# Patient Record
Sex: Female | Born: 1950 | Race: Black or African American | Hispanic: No | Marital: Married | Smoking: Former smoker
Health system: Southern US, Community
[De-identification: ages and names within clinical notes are randomized; demographics above are authoritative.]

## PROBLEM LIST (undated history)

## (undated) DIAGNOSIS — K219 Gastro-esophageal reflux disease without esophagitis: Secondary | ICD-10-CM

## (undated) DIAGNOSIS — D649 Anemia, unspecified: Secondary | ICD-10-CM

## (undated) DIAGNOSIS — F32A Depression, unspecified: Secondary | ICD-10-CM

## (undated) DIAGNOSIS — F410 Panic disorder [episodic paroxysmal anxiety] without agoraphobia: Secondary | ICD-10-CM

## (undated) DIAGNOSIS — I428 Other cardiomyopathies: Secondary | ICD-10-CM

## (undated) DIAGNOSIS — M1990 Unspecified osteoarthritis, unspecified site: Secondary | ICD-10-CM

## (undated) DIAGNOSIS — G894 Chronic pain syndrome: Secondary | ICD-10-CM

## (undated) DIAGNOSIS — N183 Chronic kidney disease, stage 3 unspecified: Secondary | ICD-10-CM

## (undated) DIAGNOSIS — Z789 Other specified health status: Secondary | ICD-10-CM

## (undated) DIAGNOSIS — J309 Allergic rhinitis, unspecified: Secondary | ICD-10-CM

## (undated) DIAGNOSIS — E785 Hyperlipidemia, unspecified: Secondary | ICD-10-CM

## (undated) DIAGNOSIS — I10 Essential (primary) hypertension: Secondary | ICD-10-CM

## (undated) DIAGNOSIS — I5042 Chronic combined systolic (congestive) and diastolic (congestive) heart failure: Secondary | ICD-10-CM

## (undated) DIAGNOSIS — K56609 Unspecified intestinal obstruction, unspecified as to partial versus complete obstruction: Secondary | ICD-10-CM

## (undated) DIAGNOSIS — F329 Major depressive disorder, single episode, unspecified: Secondary | ICD-10-CM

## (undated) HISTORY — DX: Depression, unspecified: F32.A

## (undated) HISTORY — DX: Anemia, unspecified: D64.9

## (undated) HISTORY — DX: Chronic pain syndrome: G89.4

## (undated) HISTORY — DX: Panic disorder (episodic paroxysmal anxiety): F41.0

## (undated) HISTORY — DX: Chronic combined systolic (congestive) and diastolic (congestive) heart failure: I50.42

## (undated) HISTORY — PX: CERVICAL DISC SURGERY: SHX588

## (undated) HISTORY — DX: Allergic rhinitis, unspecified: J30.9

## (undated) HISTORY — DX: Other cardiomyopathies: I42.8

## (undated) HISTORY — DX: Essential (primary) hypertension: I10

## (undated) HISTORY — DX: Major depressive disorder, single episode, unspecified: F32.9

## (undated) HISTORY — PX: COLON SURGERY: SHX602

## (undated) HISTORY — DX: Chronic kidney disease, stage 3 unspecified: N18.30

## (undated) HISTORY — DX: Hyperlipidemia, unspecified: E78.5

## (undated) HISTORY — DX: Gastro-esophageal reflux disease without esophagitis: K21.9

## (undated) HISTORY — PX: TUBAL LIGATION: SHX77

---

## 1997-07-14 ENCOUNTER — Ambulatory Visit (HOSPITAL_COMMUNITY): Admission: RE | Admit: 1997-07-14 | Discharge: 1997-07-14 | Payer: Self-pay | Admitting: Obstetrics & Gynecology

## 1998-01-20 HISTORY — PX: ABDOMINAL HYSTERECTOMY: SHX81

## 1998-05-16 ENCOUNTER — Emergency Department (HOSPITAL_COMMUNITY): Admission: EM | Admit: 1998-05-16 | Discharge: 1998-05-16 | Payer: Self-pay | Admitting: Emergency Medicine

## 1998-08-15 ENCOUNTER — Other Ambulatory Visit: Admission: RE | Admit: 1998-08-15 | Discharge: 1998-08-15 | Payer: Self-pay | Admitting: Family Medicine

## 2000-03-27 ENCOUNTER — Other Ambulatory Visit: Admission: RE | Admit: 2000-03-27 | Discharge: 2000-03-27 | Payer: Self-pay | Admitting: Family Medicine

## 2000-07-07 ENCOUNTER — Encounter (INDEPENDENT_AMBULATORY_CARE_PROVIDER_SITE_OTHER): Payer: Self-pay | Admitting: Specialist

## 2000-07-07 ENCOUNTER — Inpatient Hospital Stay (HOSPITAL_COMMUNITY): Admission: RE | Admit: 2000-07-07 | Discharge: 2000-07-10 | Payer: Self-pay | Admitting: *Deleted

## 2002-04-22 ENCOUNTER — Encounter: Admission: RE | Admit: 2002-04-22 | Discharge: 2002-04-22 | Payer: Self-pay | Admitting: Family Medicine

## 2002-04-22 ENCOUNTER — Encounter: Payer: Self-pay | Admitting: Family Medicine

## 2002-05-30 ENCOUNTER — Encounter: Payer: Self-pay | Admitting: Emergency Medicine

## 2002-05-30 ENCOUNTER — Emergency Department (HOSPITAL_COMMUNITY): Admission: EM | Admit: 2002-05-30 | Discharge: 2002-05-30 | Payer: Self-pay | Admitting: Emergency Medicine

## 2003-09-26 ENCOUNTER — Encounter: Admission: RE | Admit: 2003-09-26 | Discharge: 2003-09-26 | Payer: Self-pay | Admitting: Family Medicine

## 2004-02-26 ENCOUNTER — Ambulatory Visit: Payer: Self-pay | Admitting: Family Medicine

## 2004-02-29 ENCOUNTER — Encounter: Admission: RE | Admit: 2004-02-29 | Discharge: 2004-02-29 | Payer: Self-pay | Admitting: Family Medicine

## 2004-03-04 ENCOUNTER — Ambulatory Visit: Payer: Self-pay | Admitting: Family Medicine

## 2004-10-15 ENCOUNTER — Ambulatory Visit: Payer: Self-pay | Admitting: Family Medicine

## 2005-05-05 ENCOUNTER — Ambulatory Visit: Payer: Self-pay | Admitting: Family Medicine

## 2005-05-22 ENCOUNTER — Ambulatory Visit: Payer: Self-pay | Admitting: Family Medicine

## 2005-06-13 ENCOUNTER — Ambulatory Visit (HOSPITAL_COMMUNITY): Admission: RE | Admit: 2005-06-13 | Discharge: 2005-06-13 | Payer: Self-pay | Admitting: Anesthesiology

## 2005-07-16 ENCOUNTER — Ambulatory Visit: Payer: Self-pay | Admitting: Family Medicine

## 2005-07-18 ENCOUNTER — Ambulatory Visit: Payer: Self-pay | Admitting: Internal Medicine

## 2005-07-22 ENCOUNTER — Ambulatory Visit: Payer: Self-pay | Admitting: Family Medicine

## 2005-07-25 ENCOUNTER — Ambulatory Visit: Payer: Self-pay | Admitting: Family Medicine

## 2005-07-29 ENCOUNTER — Ambulatory Visit: Payer: Self-pay | Admitting: Gastroenterology

## 2005-07-31 ENCOUNTER — Ambulatory Visit: Payer: Self-pay | Admitting: Cardiology

## 2005-08-05 ENCOUNTER — Ambulatory Visit: Payer: Self-pay | Admitting: Gastroenterology

## 2005-10-14 ENCOUNTER — Ambulatory Visit: Payer: Self-pay | Admitting: Family Medicine

## 2005-10-21 ENCOUNTER — Ambulatory Visit: Payer: Self-pay | Admitting: Family Medicine

## 2005-10-22 ENCOUNTER — Ambulatory Visit: Payer: Self-pay | Admitting: Family Medicine

## 2005-10-24 ENCOUNTER — Ambulatory Visit: Payer: Self-pay | Admitting: Family Medicine

## 2005-10-27 ENCOUNTER — Ambulatory Visit: Payer: Self-pay | Admitting: Cardiovascular Disease

## 2005-10-28 ENCOUNTER — Ambulatory Visit: Payer: Self-pay | Admitting: Family Medicine

## 2005-11-04 ENCOUNTER — Ambulatory Visit: Payer: Self-pay | Admitting: Cardiology

## 2005-11-12 ENCOUNTER — Ambulatory Visit (HOSPITAL_COMMUNITY): Admission: RE | Admit: 2005-11-12 | Discharge: 2005-11-12 | Payer: Self-pay | Admitting: Cardiology

## 2005-11-12 ENCOUNTER — Ambulatory Visit: Payer: Self-pay | Admitting: Cardiology

## 2005-11-25 ENCOUNTER — Ambulatory Visit: Payer: Self-pay | Admitting: *Deleted

## 2005-11-25 ENCOUNTER — Ambulatory Visit: Payer: Self-pay | Admitting: Cardiology

## 2006-06-03 ENCOUNTER — Ambulatory Visit: Payer: Self-pay | Admitting: Family Medicine

## 2006-07-21 ENCOUNTER — Ambulatory Visit: Payer: Self-pay | Admitting: Family Medicine

## 2006-09-03 DIAGNOSIS — J309 Allergic rhinitis, unspecified: Secondary | ICD-10-CM | POA: Insufficient documentation

## 2006-09-03 DIAGNOSIS — F329 Major depressive disorder, single episode, unspecified: Secondary | ICD-10-CM

## 2006-09-03 DIAGNOSIS — I10 Essential (primary) hypertension: Secondary | ICD-10-CM | POA: Insufficient documentation

## 2006-10-06 DIAGNOSIS — K645 Perianal venous thrombosis: Secondary | ICD-10-CM

## 2006-10-13 ENCOUNTER — Ambulatory Visit: Payer: Self-pay | Admitting: Family Medicine

## 2006-10-15 ENCOUNTER — Telehealth: Payer: Self-pay | Admitting: Family Medicine

## 2007-03-18 ENCOUNTER — Telehealth: Payer: Self-pay | Admitting: Family Medicine

## 2007-05-07 ENCOUNTER — Telehealth: Payer: Self-pay | Admitting: Family Medicine

## 2007-05-24 DIAGNOSIS — J069 Acute upper respiratory infection, unspecified: Secondary | ICD-10-CM | POA: Insufficient documentation

## 2007-05-25 ENCOUNTER — Ambulatory Visit: Payer: Self-pay | Admitting: Family Medicine

## 2007-07-08 ENCOUNTER — Ambulatory Visit: Payer: Self-pay | Admitting: Family Medicine

## 2007-07-08 LAB — CONVERTED CEMR LAB
Albumin: 4 g/dL (ref 3.5–5.2)
Alkaline Phosphatase: 47 units/L (ref 39–117)
BUN: 7 mg/dL (ref 6–23)
Basophils Relative: 0.3 % (ref 0.0–1.0)
Bilirubin Urine: NEGATIVE
CO2: 36 meq/L — ABNORMAL HIGH (ref 19–32)
Calcium: 9.6 mg/dL (ref 8.4–10.5)
Cholesterol: 168 mg/dL (ref 0–200)
GFR calc Af Amer: 112 mL/min
Glucose, Bld: 105 mg/dL — ABNORMAL HIGH (ref 70–99)
Glucose, Urine, Semiquant: NEGATIVE
Ketones, urine, test strip: NEGATIVE
LDL Cholesterol: 100 mg/dL — ABNORMAL HIGH (ref 0–99)
Lymphocytes Relative: 56.8 % — ABNORMAL HIGH (ref 12.0–46.0)
Platelets: 317 10*3/uL (ref 150–400)
RBC: 3.48 M/uL — ABNORMAL LOW (ref 3.87–5.11)
Sodium: 142 meq/L (ref 135–145)
Total CHOL/HDL Ratio: 3
Total Protein: 7.5 g/dL (ref 6.0–8.3)
Triglycerides: 58 mg/dL (ref 0–149)
Urobilinogen, UA: 0.2

## 2007-07-14 ENCOUNTER — Emergency Department (HOSPITAL_BASED_OUTPATIENT_CLINIC_OR_DEPARTMENT_OTHER): Admission: EM | Admit: 2007-07-14 | Discharge: 2007-07-14 | Payer: Self-pay | Admitting: Emergency Medicine

## 2007-07-15 ENCOUNTER — Ambulatory Visit: Payer: Self-pay | Admitting: Family Medicine

## 2007-07-15 DIAGNOSIS — M542 Cervicalgia: Secondary | ICD-10-CM

## 2007-07-15 DIAGNOSIS — D6489 Other specified anemias: Secondary | ICD-10-CM

## 2007-07-15 DIAGNOSIS — D649 Anemia, unspecified: Secondary | ICD-10-CM | POA: Insufficient documentation

## 2007-07-15 DIAGNOSIS — F410 Panic disorder [episodic paroxysmal anxiety] without agoraphobia: Secondary | ICD-10-CM

## 2007-07-15 DIAGNOSIS — E039 Hypothyroidism, unspecified: Secondary | ICD-10-CM | POA: Insufficient documentation

## 2007-07-15 LAB — CONVERTED CEMR LAB
Eosinophils Relative: 2.2 % (ref 0.0–5.0)
Folate: 9 ng/mL
Lymphocytes Relative: 52.1 % — ABNORMAL HIGH (ref 12.0–46.0)
MCV: 95.7 fL (ref 78.0–100.0)
Monocytes Absolute: 0.5 10*3/uL (ref 0.1–1.0)
Monocytes Relative: 10.3 % (ref 3.0–12.0)
Neutro Abs: 1.6 10*3/uL (ref 1.4–7.7)
Platelets: 346 10*3/uL (ref 150–400)
Transferrin: 265.3 mg/dL (ref >=212.0)
Vitamin B-12: 334 pg/mL (ref 211–911)
WBC: 4.6 10*3/uL (ref 4.5–10.5)

## 2007-07-27 ENCOUNTER — Telehealth: Payer: Self-pay | Admitting: Family Medicine

## 2007-08-09 ENCOUNTER — Telehealth: Payer: Self-pay | Admitting: Internal Medicine

## 2007-09-28 ENCOUNTER — Telehealth: Payer: Self-pay | Admitting: *Deleted

## 2007-09-30 ENCOUNTER — Telehealth: Payer: Self-pay | Admitting: Family Medicine

## 2007-12-13 ENCOUNTER — Telehealth: Payer: Self-pay | Admitting: Family Medicine

## 2008-01-03 ENCOUNTER — Telehealth: Payer: Self-pay | Admitting: Family Medicine

## 2008-01-06 ENCOUNTER — Ambulatory Visit: Payer: Self-pay | Admitting: Family Medicine

## 2008-01-10 ENCOUNTER — Encounter (INDEPENDENT_AMBULATORY_CARE_PROVIDER_SITE_OTHER): Payer: Self-pay | Admitting: *Deleted

## 2008-01-29 ENCOUNTER — Ambulatory Visit: Payer: Self-pay | Admitting: Diagnostic Radiology

## 2008-01-29 ENCOUNTER — Emergency Department (HOSPITAL_BASED_OUTPATIENT_CLINIC_OR_DEPARTMENT_OTHER): Admission: EM | Admit: 2008-01-29 | Discharge: 2008-01-29 | Payer: Self-pay | Admitting: Emergency Medicine

## 2008-02-02 ENCOUNTER — Telehealth: Payer: Self-pay | Admitting: *Deleted

## 2008-02-14 ENCOUNTER — Emergency Department (HOSPITAL_BASED_OUTPATIENT_CLINIC_OR_DEPARTMENT_OTHER): Admission: EM | Admit: 2008-02-14 | Discharge: 2008-02-14 | Payer: Self-pay | Admitting: Emergency Medicine

## 2008-02-21 ENCOUNTER — Telehealth: Payer: Self-pay | Admitting: *Deleted

## 2008-02-23 ENCOUNTER — Ambulatory Visit (HOSPITAL_BASED_OUTPATIENT_CLINIC_OR_DEPARTMENT_OTHER): Admission: RE | Admit: 2008-02-23 | Discharge: 2008-02-23 | Payer: Self-pay | Admitting: Emergency Medicine

## 2008-02-23 ENCOUNTER — Ambulatory Visit: Payer: Self-pay | Admitting: Diagnostic Radiology

## 2008-04-10 ENCOUNTER — Telehealth: Payer: Self-pay | Admitting: Family Medicine

## 2008-04-13 ENCOUNTER — Telehealth: Payer: Self-pay | Admitting: Family Medicine

## 2008-04-17 ENCOUNTER — Telehealth: Payer: Self-pay | Admitting: *Deleted

## 2008-09-04 ENCOUNTER — Ambulatory Visit: Payer: Self-pay | Admitting: Internal Medicine

## 2008-09-14 ENCOUNTER — Telehealth: Payer: Self-pay | Admitting: Family Medicine

## 2008-10-25 ENCOUNTER — Telehealth: Payer: Self-pay | Admitting: Family Medicine

## 2008-11-02 ENCOUNTER — Ambulatory Visit: Payer: Self-pay | Admitting: Family Medicine

## 2008-11-02 LAB — CONVERTED CEMR LAB
AST: 20 units/L (ref 0–37)
Albumin: 4.3 g/dL (ref 3.5–5.2)
Bilirubin Urine: NEGATIVE
Bilirubin, Direct: 0 mg/dL (ref 0.0–0.3)
Calcium: 9.3 mg/dL (ref 8.4–10.5)
Cholesterol: 198 mg/dL (ref 0–200)
Creatinine, Ser: 0.6 mg/dL (ref 0.4–1.2)
Eosinophils Absolute: 0 10*3/uL (ref 0.0–0.7)
Eosinophils Relative: 1.2 % (ref 0.0–5.0)
HCT: 35.4 % — ABNORMAL LOW (ref 36.0–46.0)
HDL: 67.1 mg/dL (ref >39.00)
Hemoglobin: 12 g/dL (ref 12.0–15.0)
Ketones, ur: NEGATIVE mg/dL
LDL Cholesterol: 123 mg/dL — ABNORMAL HIGH (ref 0–99)
Leukocytes, UA: NEGATIVE
Lymphocytes Relative: 41.1 % (ref 12.0–46.0)
Lymphs Abs: 1.7 10*3/uL (ref 0.7–4.0)
MCV: 96.3 fL (ref 78.0–100.0)
Monocytes Relative: 9.8 % (ref 3.0–12.0)
Neutro Abs: 2 10*3/uL (ref 1.4–7.7)
Nitrite: NEGATIVE
Sodium: 141 meq/L (ref 135–145)
TSH: 0.56 microintl units/mL (ref 0.35–5.50)
Total CHOL/HDL Ratio: 3
Total Protein: 7.6 g/dL (ref 6.0–8.3)
Triglycerides: 41 mg/dL (ref 0.0–149.0)
Urobilinogen, UA: 0.2 (ref 0.0–1.0)
VLDL: 8.2 mg/dL (ref 0.0–40.0)
pH: 7 (ref 5.0–8.0)

## 2008-11-09 ENCOUNTER — Ambulatory Visit: Payer: Self-pay | Admitting: Family Medicine

## 2008-11-09 DIAGNOSIS — E876 Hypokalemia: Secondary | ICD-10-CM | POA: Insufficient documentation

## 2008-11-10 ENCOUNTER — Telehealth: Payer: Self-pay | Admitting: Family Medicine

## 2008-12-05 ENCOUNTER — Ambulatory Visit (HOSPITAL_COMMUNITY): Admission: RE | Admit: 2008-12-05 | Discharge: 2008-12-06 | Payer: Self-pay | Admitting: Neurological Surgery

## 2009-01-16 ENCOUNTER — Telehealth: Payer: Self-pay | Admitting: Family Medicine

## 2009-01-31 ENCOUNTER — Telehealth: Payer: Self-pay | Admitting: Family Medicine

## 2009-04-11 ENCOUNTER — Ambulatory Visit: Payer: Self-pay | Admitting: Family Medicine

## 2009-04-11 DIAGNOSIS — H60399 Other infective otitis externa, unspecified ear: Secondary | ICD-10-CM | POA: Insufficient documentation

## 2009-05-02 ENCOUNTER — Ambulatory Visit: Payer: Self-pay | Admitting: Family Medicine

## 2009-05-02 DIAGNOSIS — J029 Acute pharyngitis, unspecified: Secondary | ICD-10-CM

## 2009-05-10 ENCOUNTER — Telehealth: Payer: Self-pay | Admitting: Family Medicine

## 2009-05-21 ENCOUNTER — Telehealth: Payer: Self-pay | Admitting: Family Medicine

## 2009-05-24 ENCOUNTER — Ambulatory Visit: Payer: Self-pay | Admitting: Family Medicine

## 2009-05-24 ENCOUNTER — Telehealth: Payer: Self-pay | Admitting: *Deleted

## 2009-10-13 ENCOUNTER — Encounter: Admission: RE | Admit: 2009-10-13 | Discharge: 2009-10-13 | Payer: Self-pay | Admitting: Neurological Surgery

## 2009-10-29 ENCOUNTER — Telehealth: Payer: Self-pay | Admitting: Family Medicine

## 2009-11-09 ENCOUNTER — Ambulatory Visit: Payer: Self-pay | Admitting: Family Medicine

## 2009-11-09 LAB — CONVERTED CEMR LAB
ALT: 15 units/L (ref 0–35)
Albumin: 4.3 g/dL (ref 3.5–5.2)
BUN: 11 mg/dL (ref 6–23)
Basophils Relative: 0.9 % (ref 0.0–3.0)
Chloride: 102 meq/L (ref 96–112)
Creatinine, Ser: 0.7 mg/dL (ref 0.4–1.2)
Direct LDL: 130 mg/dL
Eosinophils Absolute: 0.1 10*3/uL (ref 0.0–0.7)
GFR calc non Af Amer: 106.99 mL/min (ref >60)
Glucose, Bld: 83 mg/dL (ref 70–99)
Glucose, Urine, Semiquant: NEGATIVE
Lymphs Abs: 1.8 10*3/uL (ref 0.7–4.0)
MCV: 92.5 fL (ref 78.0–100.0)
Monocytes Absolute: 0.6 10*3/uL (ref 0.1–1.0)
Monocytes Relative: 12.5 % — ABNORMAL HIGH (ref 3.0–12.0)
Neutrophils Relative %: 44.4 % (ref 43.0–77.0)
Nitrite: NEGATIVE
Platelets: 294 10*3/uL (ref 150.0–400.0)
Potassium: 3.4 meq/L — ABNORMAL LOW (ref 3.5–5.1)
RBC: 3.61 M/uL — ABNORMAL LOW (ref 3.87–5.11)
RDW: 15.1 % — ABNORMAL HIGH (ref 11.5–14.6)
Total CHOL/HDL Ratio: 3
Urobilinogen, UA: 0.2
VLDL: 13.6 mg/dL (ref 0.0–40.0)
WBC: 4.5 10*3/uL (ref 4.5–10.5)
pH: 7

## 2009-12-11 ENCOUNTER — Encounter: Payer: Self-pay | Admitting: Family Medicine

## 2009-12-11 ENCOUNTER — Ambulatory Visit: Payer: Self-pay | Admitting: Family Medicine

## 2010-01-25 ENCOUNTER — Ambulatory Visit
Admission: RE | Admit: 2010-01-25 | Discharge: 2010-01-25 | Payer: Self-pay | Source: Home / Self Care | Attending: Family Medicine | Admitting: Family Medicine

## 2010-02-12 ENCOUNTER — Ambulatory Visit
Admission: RE | Admit: 2010-02-12 | Discharge: 2010-02-12 | Payer: Self-pay | Source: Home / Self Care | Attending: Family Medicine | Admitting: Family Medicine

## 2010-02-17 LAB — CONVERTED CEMR LAB
Calcium: 9.5 mg/dL (ref 8.4–10.5)
Glucose, Bld: 102 mg/dL — ABNORMAL HIGH (ref 70–99)
Potassium: 3.4 meq/L — ABNORMAL LOW (ref 3.5–5.1)
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
## 2010-02-19 NOTE — Progress Notes (Signed)
Summary: med refill  Phone Note Refill Request Message from:  [facility]  Refills Requested: Medication #1:  ZOLPIDEM TARTRATE 10 MG TABS one at bedtime as needed for sleep Initial call taken by: Heron Sabins,  October 29, 2009 3:07 PM  Follow-up for Phone Call        Ambien dispense to 10 mg, number 30, 1 nightly, refills x 3 Follow-up by: Roderick Pee MD,  October 29, 2009 5:24 PM  Additional Follow-up for Phone Call Additional follow up Details #1::        Rx called to pharmacy Additional Follow-up by: DeShannon Smith CMA (AAMA),  October 30, 2009 11:00 AM    Prescriptions: ZOLPIDEM TARTRATE 10 MG TABS (ZOLPIDEM TARTRATE) one at bedtime as needed for sleep  #30 x 3   Entered by:   Mervin Hack CMA (AAMA)   Authorized by:   Roderick Pee MD   Signed by:   Mervin Hack CMA (AAMA) on 10/30/2009   Method used:   Telephoned to ...       [facility] [address] (retail)       [address]       [place], [place]  [address]       Ph: [phone] or [phone]       Fax: [phone]   RxID:   [id number]

## 2010-02-19 NOTE — Progress Notes (Signed)
Summary: [name]  Phone Note Refill Request Message from:  Fax from Pharmacy on May 10, 2009 1:53 PM  Refills Requested: Medication #1:  ZOLPIDEM TARTRATE 10 MG TABS one at bedtime as needed for sleep Initial call taken by: Kern Reap CMA Duncan Dull),  May 10, 2009 1:53 PM    Prescriptions: ZOLPIDEM TARTRATE 10 MG TABS (ZOLPIDEM TARTRATE) one at bedtime as needed for sleep  #100 x 3   Entered by:   Kern Reap CMA (AAMA)   Authorized by:   Roderick Pee MD   Signed by:   Kern Reap CMA (AAMA) on 05/10/2009   Method used:   Historical   RxID:   [id number]
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
## 2010-02-19 NOTE — Progress Notes (Signed)
Summary: hydrocodone refill  Phone Note From Pharmacy   Summary of Call: patient is requesting a refill of hydrocodone is this okay to fill? Initial call taken by: Kern Reap CMA Duncan Dull),  May 21, 2009 11:42 AM  Follow-up for Phone Call        surgery done by Dr. Danielle Dess refer her to him for pain treatment Follow-up by: Roderick Pee MD,  May 21, 2009 11:55 AM  Additional Follow-up for Phone Call Additional follow up Details #1::        faxed to pharmacy Additional Follow-up by: Kern Reap CMA Duncan Dull),  May 21, 2009 3:50 PM

## 2010-02-19 NOTE — Progress Notes (Signed)
Summary: citalopram refill  Phone Note From Pharmacy   [facility] Call For: [name]  Summary of Call: request to fill citalopram 20mg  Take 1 tablet by mouth once a day .  Last 01/16/09 #30 with no refills Initial call taken by: Gladis Riffle, RN,  January 31, 2009 12:09 PM  Follow-up for Phone Call        Celexa 20 mg, dispense 100 tablets directions one nightly, refills x 3 Follow-up by: Roderick Pee MD,  January 31, 2009 12:33 PM  Additional Follow-up for Phone Call Additional follow up Details #1::        see Rx Additional Follow-up by: Gladis Riffle, RN,  January 31, 2009 2:38 PM    New/Updated Medications: CELEXA 20 MG  TABS (CITALOPRAM HYDROBROMIDE) one once daily CELEXA 20 MG  TABS (CITALOPRAM HYDROBROMIDE) one once daily at bedtime Prescriptions: CELEXA 20 MG  TABS (CITALOPRAM HYDROBROMIDE) one once daily at bedtime  #100 x 3   Entered by:   Gladis Riffle, RN   Authorized by:   Roderick Pee MD   Signed by:   Gladis Riffle, RN on 01/31/2009   Method used:   Electronically to        [facility] [phone]* (retail)       [address]       [place], [place]  [address]       Ph: [phone] or [phone]       Fax: [phone]   RxID:   [id number]

## 2010-02-19 NOTE — Assessment & Plan Note (Signed)
Summary: sore throat//ccm   Vital Signs:  Patient profile:   60 year old female Menstrual status:  hysterectomy Weight:      127 pounds Temp:     98.0 degrees F oral BP sitting:   120 / 62  (left arm) Cuff size:   regular  Vitals Entered By: Kern Reap CMA Duncan Dull) (May 02, 2009 11:19 AM) CC: sore throat, drainage Is Patient Diabetic? No Pain Assessment Patient in pain? no        CC:  sore throat and drainage.  History of Present Illness: lou is a 60 year old female, nonsmoker, who comes in today for evaluation of a severe sore throat.  She said sore throat with no fever.  She's had a cough, but no sputum production.  She also complains of a mild headache, but no earache.  Review of systems otherwise negative.  She is recovering from neck surgery.  She had a ruptured disk, which was surgically repaired by Dr. Danielle Dess  Allergies: No Known Drug Allergies  Social History: Reviewed history from 09/03/2006 and no changes required. Occupation: Married Never Smoked Alcohol use-no Regular exercise-no  Review of Systems      See HPI  Physical Exam  General:  Well-developed,well-nourished,in no acute distress; alert,appropriate and cooperative throughout examination Head:  Normocephalic and atraumatic without obvious abnormalities. No apparent alopecia or balding. Eyes:  No corneal or conjunctival inflammation noted. EOMI. Perrla. Funduscopic exam benign, without hemorrhages, exudates or papilledema. Vision grossly normal. Ears:  External ear exam shows no significant lesions or deformities.  Otoscopic examination reveals clear canals, tympanic membranes are intact bilaterally without bulging, retraction, inflammation or discharge. Hearing is grossly normal bilaterally. Nose:  External nasal examination shows no deformity or inflammation. Nasal mucosa are pink and moist without lesions or exudates. Mouth:  Oral mucosa and oropharynx without lesions or exudates.  Teeth in  good repair. Neck:  No deformities, masses, or tenderness noted. Lungs:  Normal respiratory effort, chest expands symmetrically. Lungs are clear to auscultation, no crackles or wheezes.   Impression & Recommendations:  Problem # 1:  SORE THROAT (ICD-462) Assessment New  Orders: Rapid Strep (69629)  Complete Medication List: 1)  Norvasc 10 Mg Tabs (Amlodipine besylate) .... Once daily 2)  Levothroid 100 Mcg Tabs (Levothyroxine sodium) .Marland Kitchen.. 1 once daily 3)  Celexa 20 Mg Tabs (Citalopram hydrobromide) .... One once daily at bedtime 4)  Lorazepam 0.5 Mg Tabs (Lorazepam) .... One tablet every 8 hours as needed and 5)  Zolpidem Tartrate 10 Mg Tabs (Zolpidem tartrate) .... One at bedtime as needed for sleep 6)  Hydrochlorothiazide 25 Mg Tabs (Hydrochlorothiazide) .... Take one tab once daily 7)  Hydromet 5-1.5 Mg/72ml Syrp (Hydrocodone-homatropine) .... 1/2 to 1 tsp three times a day as needed  Patient Instructions: 1)  Get plenty of rest, drink lots of clear liquids, and use Tylenol or Ibuprofen for fever and comfort. Return in 7-10 days if you're not better:sooner if you're feeling worse. 2)  Take 650-1000mg  of Tylenol every 4-6 hours as needed for relief of pain or comfort of fever AVOID taking more than 4000mg   in a 24 hour period (can cause liver damage in higher doses). 3)  Hydromet one half to 1 teaspoon up to 3 times a day for sore throat and cough Prescriptions: HYDROMET 5-1.5 MG/5ML SYRP (HYDROCODONE-HOMATROPINE) 1/2 to 1 tsp three times a day as needed  #8oz x 0   Entered and Authorized by:   Roderick Pee MD   Signed by:  Roderick Pee MD on 05/02/2009   Method used:   Print then Give to Patient   RxID:   [id number]   Laboratory Results  Date/Time Received: May 02, 2009   Other Tests  Rapid Strep: negative Comments: Kern Reap CMA Duncan Dull)  May 02, 2009 11:43 AM

## 2010-02-19 NOTE — Progress Notes (Signed)
Summary: Pt req refill of Hydromet Cough Syrup [facility] [address]  Phone Note Call from Patient   Caller: Patient Summary of Call: Pt is req refill of Hydromet cough syrup. Pt still has a bad cough and is req more syrup. Please [facility] to [facility] [address]   Initial call taken by: Lucy Antigua,  May 24, 2009 8:57 AM  Follow-up for Phone Call        problem*.................OVtoday  Follow-up by: Roderick Pee MD,  May 24, 2009 9:40 AM  Additional Follow-up for Phone Call Additional follow up Details #1::        Phone Call Completed Additional Follow-up by: Kern Reap CMA Duncan Dull),  May 24, 2009 10:38 AM
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
## 2010-03-04 ENCOUNTER — Telehealth: Payer: Self-pay | Admitting: Family Medicine

## 2010-03-04 NOTE — Telephone Encounter (Signed)
patient  Is coming in for an office visit

## 2010-03-04 NOTE — Telephone Encounter (Signed)
If the medication.  We gave her is not helping then we will need to see her on Tuesday for an office visit

## 2010-03-04 NOTE — Telephone Encounter (Signed)
Was seen last week with depression and anxiety, due to loss of son. She was rx'd something a medication. Not helping at all. [facility]ing [facility] Please advise pt.

## 2010-03-05 ENCOUNTER — Encounter: Payer: Self-pay | Admitting: Family Medicine

## 2010-03-06 ENCOUNTER — Ambulatory Visit (INDEPENDENT_AMBULATORY_CARE_PROVIDER_SITE_OTHER): Payer: BC Managed Care – PPO | Admitting: Family Medicine

## 2010-03-06 ENCOUNTER — Ambulatory Visit: Payer: Self-pay | Admitting: Family Medicine

## 2010-03-06 ENCOUNTER — Encounter: Payer: Self-pay | Admitting: Family Medicine

## 2010-03-06 VITALS — BP 120/80 | Temp 98.0°F | Ht 60.5 in | Wt 148.0 lb

## 2010-03-06 DIAGNOSIS — F329 Major depressive disorder, single episode, unspecified: Secondary | ICD-10-CM

## 2010-03-06 MED ORDER — CITALOPRAM HYDROBROMIDE 40 MG PO TABS: ORAL_TABLET | ORAL | Status: DC

## 2010-03-06 MED ORDER — LORAZEPAM 0.5 MG PO TABS: ORAL_TABLET | ORAL | Status: DC

## 2010-03-06 NOTE — Patient Instructions (Signed)
Take one Celexa tablet at bedtime.  Take one Ativan tablet 3 times a day as needed.  Call Victorino Dike to help set up outpatient counseling.  Return p.r.n.
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
## 2010-03-07 ENCOUNTER — Other Ambulatory Visit: Payer: Self-pay | Admitting: Family Medicine

## 2010-03-11 ENCOUNTER — Ambulatory Visit: Payer: BC Managed Care – PPO | Admitting: Professional

## 2010-03-21 ENCOUNTER — Ambulatory Visit (INDEPENDENT_AMBULATORY_CARE_PROVIDER_SITE_OTHER): Payer: 59 | Admitting: Professional

## 2010-03-21 DIAGNOSIS — F4323 Adjustment disorder with mixed anxiety and depressed mood: Secondary | ICD-10-CM

## 2010-04-09 ENCOUNTER — Encounter: Payer: Self-pay | Admitting: Family Medicine

## 2010-04-09 ENCOUNTER — Ambulatory Visit (INDEPENDENT_AMBULATORY_CARE_PROVIDER_SITE_OTHER): Payer: 59 | Admitting: Family Medicine

## 2010-04-09 VITALS — BP 180/92 | HR 120 | Temp 98.7°F | Resp 12 | Ht 61.0 in | Wt 146.0 lb

## 2010-04-09 DIAGNOSIS — G4701 Insomnia due to medical condition: Secondary | ICD-10-CM

## 2010-04-09 DIAGNOSIS — R0602 Shortness of breath: Secondary | ICD-10-CM

## 2010-04-09 DIAGNOSIS — I498 Other specified cardiac arrhythmias: Secondary | ICD-10-CM

## 2010-04-09 DIAGNOSIS — I471 Supraventricular tachycardia: Secondary | ICD-10-CM

## 2010-04-09 MED ORDER — ZOLPIDEM TARTRATE 10 MG PO TABS: 10.0000 mg | ORAL_TABLET | Freq: Every evening | ORAL | Status: DC | PRN

## 2010-04-09 MED ORDER — NADOLOL 20 MG PO TABS: 20.0000 mg | ORAL_TABLET | Freq: Every day | ORAL | Status: DC

## 2010-04-09 NOTE — Patient Instructions (Signed)
Decrease the aim of the pain to a half of a 10-mg tablet in the morning.  Take 20 mg of Corgard, now, then 20 mg at bedtime tonight, then, starting tomorrow morning, take 120 mg tablet twice daily.  Return in one week for follow

## 2010-04-09 NOTE — Progress Notes (Signed)
  Subjective:    Patient ID: [name], female    DOB: 1950-05-29, 60 y.o.   MRN: [id number]  HPI [name] Is a 60 -year-old female, who comes in today because she woke up in the night with a sensation of rapid heart rate and numbness in her left arm from her elbow to her hand.  Her son was recently killed on [facility].  She still dealing with the stress and depression, secondary to that.  In addition, she has a history of underlying panic disorder.  She has no cardiac symptoms.     Review of Systems    Cardiac musculoskeletal review of systems all negative Objective:   Physical Exam    Well-developed well-nourished, female in no acute distress.  HEENT negative.  Lungs are clear to auscultation.  Cardiac exam normal except for a rate of 100.  No murmur, rubs, or gallops.  EKG within normal limits except for sinus tachycardia.  Examination left arm was normal    Assessment & Plan:  Tachycardia secondary to underlying stress and depression because of the death of her son.  Begin beta-blocker 20 mg b.i.d. Follow-up in one week

## 2010-04-22 ENCOUNTER — Encounter: Payer: Self-pay | Admitting: Family Medicine

## 2010-04-22 ENCOUNTER — Ambulatory Visit (INDEPENDENT_AMBULATORY_CARE_PROVIDER_SITE_OTHER): Payer: 59 | Admitting: Family Medicine

## 2010-04-22 VITALS — BP 110/80 | Temp 98.6°F | Wt 145.0 lb

## 2010-04-22 DIAGNOSIS — F3289 Other specified depressive episodes: Secondary | ICD-10-CM

## 2010-04-22 DIAGNOSIS — F329 Major depressive disorder, single episode, unspecified: Secondary | ICD-10-CM

## 2010-04-22 NOTE — Patient Instructions (Signed)
Follow-up in 4 weeks.  Try to cut the Ambien in half and take 5 mg nightly
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
## 2010-04-24 LAB — BASIC METABOLIC PANEL
CO2: 30 mEq/L (ref 19–32)
Calcium: 9.7 mg/dL (ref 8.4–10.5)
Glucose, Bld: 99 mg/dL (ref 70–99)
Potassium: 3.4 mEq/L — ABNORMAL LOW (ref 3.5–5.1)
Sodium: 140 mEq/L (ref 135–145)

## 2010-04-24 LAB — CBC
HCT: 35.3 % — ABNORMAL LOW (ref 36.0–46.0)
Hemoglobin: 11.9 g/dL — ABNORMAL LOW (ref 12.0–15.0)
MCHC: 33.6 g/dL (ref 30.0–36.0)
RDW: 13.3 % (ref 11.5–15.5)

## 2010-05-06 ENCOUNTER — Telehealth: Payer: Self-pay | Admitting: *Deleted

## 2010-05-06 LAB — BASIC METABOLIC PANEL
BUN: 10 mg/dL (ref 6–23)
Calcium: 10.3 mg/dL (ref 8.4–10.5)
GFR calc non Af Amer: >60 mL/min (ref >60)
Glucose, Bld: 101 mg/dL — ABNORMAL HIGH (ref 70–99)

## 2010-05-06 LAB — DIFFERENTIAL
Basophils Absolute: 0 10*3/uL (ref 0.0–0.1)
Lymphocytes Relative: 50 % — ABNORMAL HIGH (ref 12–46)
Neutro Abs: 1.9 10*3/uL (ref 1.7–7.7)

## 2010-05-06 LAB — CBC
Platelets: 306 10*3/uL (ref 150–400)
RDW: 14.2 % (ref 11.5–15.5)

## 2010-05-06 NOTE — Telephone Encounter (Signed)
Appt scheduled

## 2010-05-07 ENCOUNTER — Ambulatory Visit: Payer: 59 | Admitting: Family Medicine

## 2010-05-09 ENCOUNTER — Ambulatory Visit: Payer: 59 | Admitting: Family Medicine

## 2010-05-22 ENCOUNTER — Encounter: Payer: Self-pay | Admitting: Family Medicine

## 2010-05-22 ENCOUNTER — Ambulatory Visit (INDEPENDENT_AMBULATORY_CARE_PROVIDER_SITE_OTHER): Payer: 59 | Admitting: Family Medicine

## 2010-05-22 DIAGNOSIS — R5383 Other fatigue: Secondary | ICD-10-CM

## 2010-05-22 DIAGNOSIS — M542 Cervicalgia: Secondary | ICD-10-CM

## 2010-05-22 DIAGNOSIS — R5381 Other malaise: Secondary | ICD-10-CM

## 2010-05-22 DIAGNOSIS — K645 Perianal venous thrombosis: Secondary | ICD-10-CM

## 2010-05-22 DIAGNOSIS — D6489 Other specified anemias: Secondary | ICD-10-CM

## 2010-05-22 DIAGNOSIS — F329 Major depressive disorder, single episode, unspecified: Secondary | ICD-10-CM

## 2010-05-22 LAB — T3, FREE: T3, Free: 1.7 pg/mL — ABNORMAL LOW (ref 2.3–4.2)

## 2010-05-22 LAB — POCT URINALYSIS DIPSTICK
Leukocytes, UA: NEGATIVE
Protein, UA: NEGATIVE
Spec Grav, UA: 1.005
Urobilinogen, UA: 0.2
pH, UA: 6.5

## 2010-05-22 LAB — CBC WITH DIFFERENTIAL/PLATELET
Basophils Relative: 0.4 % (ref 0.0–3.0)
Eosinophils Relative: 1.7 % (ref 0.0–5.0)
HCT: 31.8 % — ABNORMAL LOW (ref 36.0–46.0)
Hemoglobin: 10.4 g/dL — ABNORMAL LOW (ref 12.0–15.0)
Lymphs Abs: 1.8 10*3/uL (ref 0.7–4.0)
MCV: 85.1 fl (ref 78.0–100.0)
Monocytes Absolute: 0.6 10*3/uL (ref 0.1–1.0)
RBC: 3.74 Mil/uL — ABNORMAL LOW (ref 3.87–5.11)
WBC: 4.3 10*3/uL — ABNORMAL LOW (ref 4.5–10.5)

## 2010-05-22 LAB — HEPATIC FUNCTION PANEL
ALT: 27 U/L (ref 0–35)
Total Protein: 7.7 g/dL (ref 6.0–8.3)

## 2010-05-22 LAB — BASIC METABOLIC PANEL
Chloride: 98 mEq/L (ref 96–112)
Potassium: 3.1 mEq/L — ABNORMAL LOW (ref 3.5–5.1)
Sodium: 138 mEq/L (ref 135–145)

## 2010-05-22 LAB — VITAMIN B12: Vitamin B-12: 265 pg/mL (ref 211–911)

## 2010-05-22 NOTE — Progress Notes (Signed)
  Subjective:    Patient ID: [name], female    DOB: 1950/10/27, 60 y.o.   MRN: [id number]  HPI  [name] Is a 60 year old, married female, who comes in today for evaluation of multiple issues.  As previously noted.  She is going to grief counseling because her son, [name], was killed when [employer], walking it late at night in a rainstorm.  For blood pressure.  She takes Norvasc 5 mg, hydrochlorothiazide, 25 mg, Corgard 20 mg.  BP 120/80, pulse 70 and regular.  No side effects from medication.  She continues to have neck pain.  She had cervical disk surgery by Dr. Danielle Dess.  She takes Motrin 800 mg 4 times daily.  Cautioned that I recommend max doses 800 twice daily, and no more.  She also feels fatigued.  She denies any abdominal pain, GI bleeding, et Karie Soda.  She did have a colonoscopy 5 years ago, which showed some colon polyps.  Previous labs in October, showed a low grade anemia with a hemoglobin of 11.    Review of Systems    General psychiatric review of systems otherwise negative Objective:   Physical Exam    Well-developed well-nourished, female, in no acute distress.  Examination neck normal except for swelling about both clavicles.    Assessment & Plan:  Depression continue the Ambien, 5 mg nightly, Ativan p.r.n., and grief counseling.  Hypertension.  Continue Norvasc 5 mg daily, Rhinocort, thiazide 25 daily, and Corgard 20 daily.  Hypothyroidism.  Continue Synthroid 100 daily.  Decreased energy level.  Plan decrease Motrin to 800 b.i.d., neurosurgical reconsult because of neck pain, check labs.

## 2010-05-22 NOTE — Patient Instructions (Signed)
Continue your current medications.  Do not take more than 800 mg of Motrin twice daily.  Call and reconsult with the neurosurgery group about two neck.  I will call you next week when I get your lab work back

## 2010-05-29 NOTE — Progress Notes (Signed)
Spoke with patient.

## 2010-06-04 ENCOUNTER — Encounter: Payer: Self-pay | Admitting: Internal Medicine

## 2010-06-04 ENCOUNTER — Ambulatory Visit (INDEPENDENT_AMBULATORY_CARE_PROVIDER_SITE_OTHER): Payer: 59 | Admitting: Internal Medicine

## 2010-06-04 DIAGNOSIS — S9000XA Contusion of unspecified ankle, initial encounter: Secondary | ICD-10-CM

## 2010-06-04 DIAGNOSIS — S9002XA Contusion of left ankle, initial encounter: Secondary | ICD-10-CM

## 2010-06-04 DIAGNOSIS — I10 Essential (primary) hypertension: Secondary | ICD-10-CM

## 2010-06-04 MED ORDER — HYDROCODONE-ACETAMINOPHEN 7.5-750 MG PO TABS: 1.0000 | ORAL_TABLET | Freq: Four times a day (QID) | ORAL | Status: DC | PRN
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
## 2010-06-04 NOTE — Patient Instructions (Signed)
Attempt to keep off your left leg as much as possible Celebrex 2 capsules daily  Vicodin as needed  Consider using the Ace bandage if pain persists

## 2010-06-07 NOTE — Assessment & Plan Note (Signed)
[facility]                              CARDIOLOGY OFFICE NOTE   [name], [name]                     MRN:          [id number]  DATE:11/25/2005                            DOB:          1950/10/05    REFERRING PHYSICIAN:  Cecil Cranker, MD, [facility]   PATIENT OF:  Dr. Randa Evens and Dr. Kelle Darting.   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  The  patient called yesterday saying she has had some back pain off and on since  her angiogram and was brought in for evaluation.  She had mild oozing from  her right groin.  Today, she has a severe headache that she woke up with in  the middle of the night, but she does have a history of headaches.  She said  she has had some bilateral back pain, abdominal pain, constipation and a  fullness in her abdomen ever since the angiogram.  She is not sure whether  it has anything to do with it or if it is a separate problem.  She denies  any dizziness or presyncope, chest pain, shortness of breath.   CURRENT MEDICATIONS:  1. Norvasc 10 mg daily.  2. Aspirin 81 mg daily.  3. Hydrochlorothiazide 25 mg daily.  4. K-Dur 20 mEq daily.  5. Synthroid 50 mcg daily.  6. Atenolol 25 mg daily.   PHYSICAL EXAMINATION:  This is a pleasant 60 year old African-American  female in no acute distress.  Blood pressure 153/92, pulse 93, weight 150.  NECK:  Without JVD, HR, bruit or thyroid enlargement.  LUNGS:  Clear anterior and posterolateral.  HEART:  Regular rate and rhythm at 90 BPM, normal S1, S2, positive S4 with  2/6 systolic murmur at the left sternal border.  ABDOMEN:  Soft, without organomegaly, masses, lesions, or abnormal  tenderness.  LOWER BACK:  She says she  can feel some tenderness when I palpate her lower  back but there is no severe pain or bruising.  EXTREMITIES:  Right groin does have a right femoral bruit.  There is no  significant widened pulse.  She has good distal pulses.   IMPRESSION:  1. Back pain, ?Etiology.  Need to consider retroperitoneal bleed status      post angiogram.  2. Normal renal arteries on angiogram.  3. Probable essential hypertension, difficult to control.  4. Abdominal fullness and constipation.  5. History of diverticulosis.   PLAN:  At this time we will check a stat CBC.  If there is any indication  that she is anemic, she will need an abdominal CT to rule out  retroperitoneal bleed.  I suspect that this not related to her angiogram.  She will follow up with Dr. Samule Ohm in one month or sooner if needed.      Jacolyn Reedy, PA-C  Electronically Signed      Cecil Cranker, MD, [facility]  Electronically Signed   ML/MedQ  DD: 11/25/2005  DT: 11/25/2005  Job #: [id number]   cc:   Salvadore Farber, MD  Eugenio Hoes Tawanna Cooler, MD

## 2010-06-07 NOTE — Op Note (Signed)
[name], [name] NO.:  [id number]   MEDICAL RECORD NO.:  [id number]          PATIENT TYPE:  AMB   LOCATION:  SDS                          FACILITY:  [facility]   PHYSICIAN:  Salvadore Farber, MD  DATE OF BIRTH:  Apr 08, 1950   DATE OF PROCEDURE:  11/12/2005  DATE OF DISCHARGE:  11/12/2005                                 OPERATIVE REPORT   PROCEDURE:  Abdominal aortography, selective bilateral renal angiography.   INDICATIONS:  [name] is a 60 year old lady with difficult to control  hypertension.  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  She presents for  angiography to assess the renal arteries and exclude fibromuscular  dysplasia.   PROCEDURE TECHNIQUE:  Informed consent was obtained.  Under 1% lidocaine  local anesthesia, a 5-French sheath was placed in the right common femoral  artery using modified Seldinger technique.  Pigtail catheter was advanced to  the suprarenal abdominal aorta.  Abdominal aortography was performed by  power injection.  A LIMA catheter was then used to selectively engage each  renal artery in turn.  Renal arteries are angiographically normal.  Sheath  was removed and manual compression applied.  She was transferred to holding  room in stable condition having tolerated the procedure well.   COMPLICATIONS:  None.   FINDINGS:  1. Abdominal aorta:  Normal infrarenal abdominal aorta without plaque      formation or aneurysm.  2. Single renal arteries bilaterally.  Both are normal.   IMPRESSION/PLAN:  Angiographically normal renal arteries.  Probable  essential hypertension.      Salvadore Farber, MD  Electronically Signed     WED/MEDQ  D:  11/12/2005  T:  11/13/2005  Job:  [id number]   cc:   Tinnie Gens A. Tawanna Cooler, MD

## 2010-06-07 NOTE — Op Note (Signed)
[facility]  Patient:    [name], [name]                     MRN: [id number] Proc. Date: 07/07/00 Adm. Date:  [id number] Attending:  Genia Del                           Operative Report  DATE OF BIRTH:                03-24-1950.  PREOPERATIVE DIAGNOSIS:       Menorrhagia on voluminous uterine myoma with secondary anemia refractory to medical treatment.  POSTOPERATIVE DIAGNOSIS:      Menorrhagia on voluminous uterine myoma with secondary anemia refractory to medical treatment.  OPERATION:                    Total abdominal hysterectomy with bilateral salpingo-oophorectomy.  SURGEON:                      Genia Del, M.D.  ASSISTANT:                    Silverio Lay, M.D.  ANESTHESIOLOGIST:             Dr. Pamalee Leyden.  ANESTHESIA:                   General.  ESTIMATED BLOOD LOSS:         100 cc.  DESCRIPTION OF PROCEDURE:     Under general anesthesia with endotracheal intubation, the patient is in decubitus dorsal position. A vaginal exam is done under general anesthesia revealing a mobile, increased volume uterus corresponding to about 12 weeks, no adnexal mass. The cervix is normal to palpation. The patient is then prepped with Betadine on the abdominal, suprapubic, vulvar, and vaginal areas. A bladder catheter is inserted and the patient is draped as usual. A Pfannenstiel incision is done at the site of the previous scar with the scalpel. We then opened the aponeurosis transversely with the Mayo scissors. The aponeurosis is detached from the rectus muscles on the midline superiorly and inferiorly. We then open the parietal peritoneum longitudinally with the Metzenbaum scissors and enter the abdominopelvic cavity. Abdominally, no pathology is felt or seen. In the pelvis, we note an enlarged uterus with a left posterior intramural myoma about 6 to 7 cm. The two ovaries are normal in appearance and volume. The tubes are post  bilateral tubal ligation. The bladder is adherent anteriorly on the lower uterine segment. We clamped the uterine cornua with Kelly clamps on each side of the uterus. We then use Metzenbaum scissors to release the bladder anteriorly. We then insert the Balfour retractor and the bowels are retracted upward with laps. We suture the left and then the right round ligaments. We use the cautery to cut and cauterize the round ligaments. With the Metzenbaum scissors we then open the anterior peritoneum and retract the bladder downward. We also open the peritoneum posteriorly in order to visualize the ureter on each side. The ureter is in normal anatomic location. We then open a window at the level of the broad ligaments on each side. We clamp the infundibulopelvic ligament with two curved Mastersons. We cut in between with Mayo scissors. We then suture with 0 Vicryl and a free tie on the left and on the right side. We then skeletonize the uterine arteries on both sides.  We clamp with curved Mastersons and double suture the uterine arteries with 0 Vicryl. We then go down the uterus with straight Mastersons, clamping, cutting with the scalpel and suturing with a Heaney stitch with Vicryl 0 on each side. We then reach the uterosacral ligaments which are taken separately with curved Heaneys. We suture with 0 Vicryl 0 on each side and keep that suture. We then reach the angle of the vagina. We used curved Mastersons to clamp then cut with the Mayo scissors and suture with a Heaney stitch with 0 Vicryl 0 and keep those angles. We finish cutting all around the cervix at the upper vaginal border with the Satinsky scissors. The uterus is given in one piece with the tubes and ovaries. The cervix is intact and the pathology piece is sent to pathology. We then proceed with suspension with each angle of the vagina being attached to the uterosacral ligament of the respective site with 0 Vicryl. We then close the  vaginal vault with X stitches with 0 Vicryl. We then irrigate the pelvic cavity with warm water. We suction and hemostasis is completed with the electrocautery where necessary. All the pedicles are verified and hemostasis is adequate. We then remove the Balfour retractor and the three laps. We verify hemostasis on the rectus muscles and aponeurosis. Electrocautery is used when necessary. We then close the aponeurosis by two half running sutures with 0 Vicryl. We then verify hemostasis in the adipose tissue completed with the electrocautery. Count of instruments and laps are complete x 2. We then infiltrate the subcutaneous with Marcaine 0.25%, 15 cc and the skin is reapproximated with staples. A dry dressing is then applied. Estimated blood loss was 100 cc. No complication occurred and the patient was transferred to recovery room in good status. DD:  07/07/00 TD:  07/07/00 Job: [id number]

## 2010-06-07 NOTE — Discharge Summary (Signed)
[facility]  Patient:    [name], [name]                     MRN: [id number] Adm. Date:  [id number] Disc. Date: [id number] Attending:  Genia Del                           Discharge Summary  DATE OF BIRTH:                March 21, 1950.  ADMISSION DIAGNOSES:          1. Menorrhagia with secondary anemia.                               2. 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  DISCHARGE DIAGNOSES:          1. Menorrhagia with secondary anemia.                               2. 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  HOSPITAL COURSE:              The patient was admitted on July 07, 2000. A TAH/BSO was done under general anesthesia on the same date. Her ovaries were normal x 2 and tubes were status post bilateral tubal ligation. Estimated blood loss was 100 cc. No complication occurred.  The postoperative evolution was unremarkable. The patient was discharged home on postoperative day #3. Postoperative hemoglobin was stable at 6.3 with a hematocrit of 18.5 on July 10, 2000. The patient was discharged in good status.  DISCHARGE MEDICATIONS:        She was advised Slow Fe t.i.d., and Tylox and Ibuprofen were prescribed p.r.n. and she was continued on Climara 0.1 patch which had been started on postoperative day #1.  DISCHARGE FOLLOWUP:           The patient will follow up at [facility] in four to six weeks. Postoperative advisories were given. DD:  08/23/00 TD:  08/24/00 Job: [id number]
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
## 2010-06-07 NOTE — Assessment & Plan Note (Signed)
[facility]                           GASTROENTEROLOGY OFFICE NOTE   [name], [name]                     MRN:          [id number]  DATE:07/29/2005                            DOB:          July 12, 1950    REFERRING PHYSICIAN:  JEFFREY ALLEN TODD   REFERRING PHYSICIAN:  Dr. Kelle Darting   REASON FOR REFERRAL:  Abdominal pain and rectal bleeding.   HISTORY OF PRESENT ILLNESS:  [name] is a very nice 60 year old African-  American female referred at the courtesy of Dr. Kelle Darting.  She relates  long-term problems with constipation and states she has been using an herbal  tea laxative about every other day for several years.  About two weeks ago  she had the sudden onset of sharp ongoing lower abdominal pain and a feeling  of gas and fullness.  She has generally had problems with constipation but  for the past two weeks her stools have been somewhat loose with several  stools coming with dark red blood.  She was seen by Dr. Clent Ridges, Dr. Tawanna Cooler, and  Dr. Fabian Sharp all within the past two weeks.  Her first visit was with Dr. Clent Ridges  and apparently a digital rectal examination showed blood, but no lesions and  she was started on two antibiotics for possible diverticulitis.  She  subsequently returned to see Dr. Tawanna Cooler and Dr. Fabian Sharp and is now referred for  further evaluation.  Her symptoms have improved slightly but she still has  ongoing problems with lower abdominal pain.  She started taking Milk of  Magnesia for her constipation and has discontinued the herbal laxative.  She  notes no change in stool caliber, fevers, chills, nausea, or vomiting.  No  family history of colon cancer, colon polyps, or inflammatory bowel disease.  She has not previously had a colonoscopy, sigmoidoscopy, or barium enema.   PAST MEDICAL HISTORY:  1.  Hypertension.  2.  Status post hysterectomy.   CURRENT MEDICATIONS:  Listed on the chart.  Updated and reviewed.   MEDICATION ALLERGIES:  None known.   SOCIAL HISTORY:  As per the hand written form.   REVIEW OF SYSTEMS:  As per the hand written form.   PHYSICAL EXAMINATION:  GENERAL:  No acute distress.  VITAL SIGNS:  Height 5 feet, weight 138.6 pounds, blood pressure is 124/76,  pulse 80 and regular.  HEENT:  Anicteric sclerae.  Oropharynx clear.  CHEST:  Clear to auscultation bilaterally.  CARDIAC:  Regular rate and rhythm without murmurs appreciated.  ABDOMEN:  Soft with mild distention and mild lower abdominal tenderness to  deep palpation.  No rebound or guarding.  No palpable organomegaly, masses,  or hernias.  Normoactive bowel sounds.  RECTAL:  Deferred to time of colonoscopy.  Recent examination by Dr. Clent Ridges  showed blood and no lesions.  EXTREMITIES:  Without clubbing, cyanosis, edema.  NEUROLOGIC:  Alert and oriented x3.  Grossly nonfocal.   ASSESSMENT AND PLAN:  New onset lower abdominal pain associated with rectal  bleeding, rule out diverticulitis, colorectal neoplasms, colitis, and other  disorders.  She is to complete her  antibiotics as prescribed.  She will call  back our office with the names of the antibiotics.  Will obtain a CBC, CMET,  and TSH today.  Schedule a CT scan of the abdomen and pelvis with IV and  oral contrast.  Pending those findings we will likely need to proceed with  colonoscopy.  Risks, benefits, and alternatives to colonoscopy with possible  biopsy and possible polypectomy discussed with the patient and she consents  to proceed.  This will be scheduled electively as indicated based on the  abdominal and pelvic CT.                                   [name]. Pleas Koch., MD, Clementeen Graham, FACP   MTS/MedQ  DD:  07/29/2005  DT:  07/29/2005  Job #:  [id number]
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
## 2010-07-15 ENCOUNTER — Encounter: Payer: Self-pay | Admitting: Family Medicine

## 2010-07-15 ENCOUNTER — Ambulatory Visit (INDEPENDENT_AMBULATORY_CARE_PROVIDER_SITE_OTHER): Payer: BC Managed Care – PPO | Admitting: Family Medicine

## 2010-07-15 DIAGNOSIS — F329 Major depressive disorder, single episode, unspecified: Secondary | ICD-10-CM

## 2010-07-15 DIAGNOSIS — F3289 Other specified depressive episodes: Secondary | ICD-10-CM

## 2010-07-15 DIAGNOSIS — W57XXXA Bitten or stung by nonvenomous insect and other nonvenomous arthropods, initial encounter: Secondary | ICD-10-CM

## 2010-07-15 DIAGNOSIS — T148 Other injury of unspecified body region: Secondary | ICD-10-CM

## 2010-07-15 MED ORDER — LORAZEPAM 0.5 MG PO TABS: ORAL_TABLET | ORAL | Status: DC

## 2010-07-15 NOTE — Patient Instructions (Signed)
In the future if you get a tic on you .Marland Kitchen...drowmed it  with oil........... wait 15 minutes and then pull it off.  Return p.r.n.

## 2010-07-15 NOTE — Progress Notes (Signed)
  Subjective:    Patient ID: [name], female    DOB: 01/01/1951, 60 y.o.   MRN: [id number]  HPI [name] Is a 60 year old, married female, nonsmoker, who is coming in today for evaluation of a tick bite on the right side of her back x 3 weeks.  Three weeks ago she noticed a tick on the right side of her back.  Her son pulled it off, and take better.  It still is leaving a knot.  No symptoms of rocking non-spotted fever   Review of Systems General an infectious review of systems otherwise negative    Objective:   Physical Exam Well-developed well-nourished, female, in no acute distress.  Examination of back shows a 3-mm nodule, consistent with A. Inflammatory reaction to a tick bite.  No evidence of secondary infection       Assessment & Plan:  Tick bite on the back reassured

## 2010-09-30 ENCOUNTER — Other Ambulatory Visit: Payer: Self-pay | Admitting: *Deleted

## 2010-09-30 DIAGNOSIS — G4701 Insomnia due to medical condition: Secondary | ICD-10-CM

## 2010-09-30 MED ORDER — ZOLPIDEM TARTRATE 10 MG PO TABS: 10.0000 mg | ORAL_TABLET | Freq: Every evening | ORAL | Status: DC | PRN

## 2010-10-01 ENCOUNTER — Other Ambulatory Visit: Payer: Self-pay | Admitting: Family Medicine

## 2010-10-01 NOTE — Telephone Encounter (Signed)
Pt needs refill on ambien 10mg  call into [facility] [address] [phone]

## 2010-10-02 NOTE — Telephone Encounter (Signed)
Recalled in 

## 2010-10-17 LAB — DIFFERENTIAL
Basophils Absolute: 0
Basophils Relative: 1
Lymphocytes Relative: 55 — ABNORMAL HIGH
Monocytes Absolute: 0.6
Neutro Abs: 2
Neutrophils Relative %: 33 — ABNORMAL LOW

## 2010-10-17 LAB — BASIC METABOLIC PANEL
BUN: 10
CO2: 26
Calcium: 10.1
Creatinine, Ser: 0.6
GFR calc non Af Amer: >60
Glucose, Bld: 117 — ABNORMAL HIGH
Sodium: 143

## 2010-10-17 LAB — POCT B-TYPE NATRIURETIC PEPTIDE (BNP): B Natriuretic Peptide, POC: 5.2

## 2010-10-17 LAB — POCT CARDIAC MARKERS
CKMB, poc: 1.4
CKMB, poc: <1 — ABNORMAL LOW
Myoglobin, poc: 21.8
Operator id: 5513
Troponin i, poc: <0.05

## 2010-10-17 LAB — CBC
Hemoglobin: 12.1
MCHC: 33.8
Platelets: 346
RDW: 12.6

## 2010-10-17 LAB — D-DIMER, QUANTITATIVE: D-Dimer, Quant: 0.25

## 2010-11-22 ENCOUNTER — Other Ambulatory Visit: Payer: Self-pay | Admitting: *Deleted

## 2010-11-22 DIAGNOSIS — F329 Major depressive disorder, single episode, unspecified: Secondary | ICD-10-CM

## 2010-11-22 MED ORDER — LORAZEPAM 0.5 MG PO TABS: ORAL_TABLET | ORAL | Status: DC

## 2010-12-26 ENCOUNTER — Encounter: Payer: Self-pay | Admitting: Family Medicine

## 2010-12-26 ENCOUNTER — Ambulatory Visit (INDEPENDENT_AMBULATORY_CARE_PROVIDER_SITE_OTHER): Payer: BC Managed Care – PPO | Admitting: Family Medicine

## 2010-12-26 DIAGNOSIS — L255 Unspecified contact dermatitis due to plants, except food: Secondary | ICD-10-CM

## 2010-12-26 DIAGNOSIS — L247 Irritant contact dermatitis due to plants, except food: Secondary | ICD-10-CM

## 2010-12-26 DIAGNOSIS — L231 Allergic contact dermatitis due to adhesives: Secondary | ICD-10-CM | POA: Insufficient documentation

## 2010-12-26 MED ORDER — TRIAMCINOLONE ACETONIDE 0.025 % EX OINT: TOPICAL_OINTMENT | Freq: Two times a day (BID) | CUTANEOUS | Status: DC

## 2010-12-26 NOTE — Patient Instructions (Signed)
Apply small amounts of the triamcinolone ointment, and a moisturizer.....udder cream......... 3 times daily until rash goes away

## 2010-12-26 NOTE — Progress Notes (Signed)
  Subjective:    Patient ID: [name], female    DOB: 11-18-1950, 60 y.o.   MRN: [id number]  HPI L. Is a 60 year old, married female, nonsmoker, who comes in today for evaluation of a skin rash.  She states on Wednesday of this week.  She noticed a red, swollen.  The rash starting on her neck.  Previous history of allergic rhinitis.   Review of Systems General and dermatologic review of systems otherwise negative    Objective:   Physical Exam Well developed, well nourished, female no acute distress.  Examination.  Skin shows a rash around the neck, consistent with a contact dermatitis       Assessment & Plan:  Contact dermatitis......... Steroid cream, and moisturizer t.i.d. Until clear

## 2011-02-21 ENCOUNTER — Other Ambulatory Visit: Payer: Self-pay | Admitting: Family Medicine

## 2011-02-21 DIAGNOSIS — G4701 Insomnia due to medical condition: Secondary | ICD-10-CM

## 2011-02-21 MED ORDER — ZOLPIDEM TARTRATE 10 MG PO TABS: 10.0000 mg | ORAL_TABLET | Freq: Every evening | ORAL | Status: DC | PRN

## 2011-02-21 NOTE — Telephone Encounter (Signed)
Pt need generic ambien 10 mg call into [facility] [address]

## 2011-03-07 ENCOUNTER — Other Ambulatory Visit: Payer: Self-pay | Admitting: Family Medicine

## 2011-03-17 ENCOUNTER — Other Ambulatory Visit: Payer: Self-pay | Admitting: Family Medicine

## 2011-03-20 ENCOUNTER — Other Ambulatory Visit: Payer: Self-pay | Admitting: *Deleted

## 2011-03-20 DIAGNOSIS — F329 Major depressive disorder, single episode, unspecified: Secondary | ICD-10-CM

## 2011-03-20 MED ORDER — LORAZEPAM 0.5 MG PO TABS: ORAL_TABLET | ORAL | Status: DC

## 2011-05-12 ENCOUNTER — Other Ambulatory Visit: Payer: Self-pay | Admitting: Family Medicine

## 2011-06-10 ENCOUNTER — Telehealth: Payer: Self-pay | Admitting: *Deleted

## 2011-06-10 NOTE — Telephone Encounter (Signed)
Pt is having a h/a, dry cough, and a "twinge" in her chest.  An immediate shock of pain that comes and goes very quickly over her left breast x2-3 days.  Pt denies SOB and fever but she is complaining of dizziness.  Pt is wanting to be worked in with Dr Tawanna Cooler tomorrow.  Spoke with Dr Tawanna Cooler, no need to see pt. He wants Fleet Contras to call pt.

## 2011-06-10 NOTE — Telephone Encounter (Signed)
Patient states she has a "twinge" that comes randomly.  She does have a dry cough.

## 2011-06-11 NOTE — Telephone Encounter (Signed)
Since she is otherwise well I would recommend Motrin 600 mg twice daily with food for the chest wall pain reassured however office visit Thursday if she insists

## 2011-06-11 NOTE — Telephone Encounter (Signed)
Left detailed message on machine for patient to call back if no improvement

## 2011-06-24 ENCOUNTER — Other Ambulatory Visit: Payer: Self-pay | Admitting: *Deleted

## 2011-06-24 DIAGNOSIS — G4701 Insomnia due to medical condition: Secondary | ICD-10-CM

## 2011-06-24 MED ORDER — ZOLPIDEM TARTRATE 10 MG PO TABS: 10.0000 mg | ORAL_TABLET | Freq: Every evening | ORAL | Status: DC | PRN

## 2011-07-21 ENCOUNTER — Other Ambulatory Visit: Payer: Self-pay | Admitting: *Deleted

## 2011-07-21 DIAGNOSIS — F329 Major depressive disorder, single episode, unspecified: Secondary | ICD-10-CM

## 2011-07-21 MED ORDER — LORAZEPAM 0.5 MG PO TABS: ORAL_TABLET | ORAL | Status: DC

## 2011-08-05 ENCOUNTER — Telehealth: Payer: Self-pay | Admitting: Family Medicine

## 2011-08-05 NOTE — Telephone Encounter (Signed)
Caller: Rain/Patient; PCP: Roderick Pee.; CB#: [phone]; Removed a tick from her right thigh on Saturday, 7/12.  States that she removed all of the tick.  The area is painful.  No fever.  No rash, but has redness around the bite.   The pain is constant, rates 5/10 on pain scale. Triaged per Bites, needs to be seen in 4 hours.  No appt.available.  Note to office staff.

## 2011-08-05 NOTE — Telephone Encounter (Signed)
Julia Contras, do you want to work her in, or?

## 2011-08-05 NOTE — Telephone Encounter (Signed)
Left detailed message on machine for patient to call back if any fever etc within the next 2 weeks

## 2011-08-06 ENCOUNTER — Telehealth: Payer: Self-pay | Admitting: *Deleted

## 2011-08-06 ENCOUNTER — Ambulatory Visit: Payer: Self-pay | Admitting: Family Medicine

## 2011-08-06 NOTE — Telephone Encounter (Signed)
Patient is calling because the tick bite is on her left inner thigh, painful to touch, size of a quarter, red.  She is also having "twinges" on the left side of her head.   I informed the patient to watch the site and let us know if there are any changes or if she has a fever.  She can also take Tylenol/Advil for any pain.

## 2011-08-28 ENCOUNTER — Other Ambulatory Visit (INDEPENDENT_AMBULATORY_CARE_PROVIDER_SITE_OTHER): Payer: BC Managed Care – PPO

## 2011-08-28 DIAGNOSIS — Z0000 Encounter for general adult medical examination without abnormal findings: Secondary | ICD-10-CM

## 2011-08-28 LAB — CBC WITH DIFFERENTIAL/PLATELET
Basophils Absolute: 0 10*3/uL (ref 0.0–0.1)
Eosinophils Relative: 2.4 % (ref 0.0–5.0)
HCT: 37.8 % (ref 36.0–46.0)
Lymphs Abs: 2.2 10*3/uL (ref 0.7–4.0)
Monocytes Relative: 10.5 % (ref 3.0–12.0)
Neutrophils Relative %: 45.2 % (ref 43.0–77.0)
Platelets: 292 10*3/uL (ref 150.0–400.0)
RDW: 14.8 % — ABNORMAL HIGH (ref 11.5–14.6)
WBC: 5.2 10*3/uL (ref 4.5–10.5)

## 2011-08-28 LAB — BASIC METABOLIC PANEL
BUN: 9 mg/dL (ref 6–23)
Calcium: 9.1 mg/dL (ref 8.4–10.5)
Creatinine, Ser: 0.6 mg/dL (ref 0.4–1.2)
GFR: 145.1 mL/min (ref >60.00)
Glucose, Bld: 81 mg/dL (ref 70–99)
Potassium: 3.2 mEq/L — ABNORMAL LOW (ref 3.5–5.1)

## 2011-08-28 LAB — HEPATIC FUNCTION PANEL
AST: 18 U/L (ref 0–37)
Bilirubin, Direct: 0 mg/dL (ref 0.0–0.3)
Total Bilirubin: 0.5 mg/dL (ref 0.3–1.2)

## 2011-08-28 LAB — POCT URINALYSIS DIPSTICK
Nitrite, UA: NEGATIVE
Protein, UA: NEGATIVE
Spec Grav, UA: 1.01
Urobilinogen, UA: 0.2

## 2011-08-28 LAB — LIPID PANEL
Cholesterol: 160 mg/dL (ref 0–200)
LDL Cholesterol: 82 mg/dL (ref 0–99)
Total CHOL/HDL Ratio: 3
Triglycerides: 77 mg/dL (ref 0.0–149.0)
VLDL: 15.4 mg/dL (ref 0.0–40.0)

## 2011-09-04 ENCOUNTER — Ambulatory Visit (INDEPENDENT_AMBULATORY_CARE_PROVIDER_SITE_OTHER): Payer: BC Managed Care – PPO | Admitting: Family Medicine

## 2011-09-04 ENCOUNTER — Encounter: Payer: Self-pay | Admitting: Family Medicine

## 2011-09-04 VITALS — BP 180/100 | Temp 98.2°F | Ht 60.5 in | Wt 127.0 lb

## 2011-09-04 DIAGNOSIS — M542 Cervicalgia: Secondary | ICD-10-CM

## 2011-09-04 DIAGNOSIS — J309 Allergic rhinitis, unspecified: Secondary | ICD-10-CM

## 2011-09-04 DIAGNOSIS — G4700 Insomnia, unspecified: Secondary | ICD-10-CM

## 2011-09-04 DIAGNOSIS — F329 Major depressive disorder, single episode, unspecified: Secondary | ICD-10-CM

## 2011-09-04 DIAGNOSIS — I10 Essential (primary) hypertension: Secondary | ICD-10-CM

## 2011-09-04 MED ORDER — HYDROCHLOROTHIAZIDE 25 MG PO TABS: 25.0000 mg | ORAL_TABLET | Freq: Every day | ORAL | Status: DC

## 2011-09-04 MED ORDER — FLUTICASONE PROPIONATE 50 MCG/ACT NA SUSP: 1.0000 | Freq: Two times a day (BID) | NASAL | Status: DC

## 2011-09-04 MED ORDER — AMLODIPINE BESYLATE 10 MG PO TABS
10.0000 mg | ORAL_TABLET | Freq: Every day | ORAL | Status: DC
Start: 2011-09-04 — End: 2012-01-09

## 2011-09-04 MED ORDER — ZOLPIDEM TARTRATE 5 MG PO TABS: ORAL_TABLET | ORAL | Status: DC

## 2011-09-04 MED ORDER — NADOLOL 20 MG PO TABS
20.0000 mg | ORAL_TABLET | Freq: Every day | ORAL | Status: DC
Start: 2011-09-04 — End: 2012-01-09

## 2011-09-04 MED ORDER — LORAZEPAM 0.5 MG PO TABS: ORAL_TABLET | ORAL | Status: DC
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
## 2011-09-04 NOTE — Patient Instructions (Signed)
Continue your current medications except decrease the Ambien to one half of a 5 mg tablet at bedtime for sleep  6 call and make an appointment to see [name]  Return in one year sooner if any problems  Remember to do a good  breast exam monthly  Call today to get set up for a screening mammogram

## 2011-09-09 ENCOUNTER — Encounter: Payer: Self-pay | Admitting: Family Medicine

## 2011-10-01 ENCOUNTER — Other Ambulatory Visit: Payer: Self-pay | Admitting: *Deleted

## 2011-10-01 DIAGNOSIS — G4700 Insomnia, unspecified: Secondary | ICD-10-CM

## 2011-10-01 MED ORDER — ZOLPIDEM TARTRATE 5 MG PO TABS: ORAL_TABLET | ORAL | Status: DC

## 2011-10-27 ENCOUNTER — Emergency Department (HOSPITAL_COMMUNITY): Payer: BC Managed Care – PPO

## 2011-10-27 ENCOUNTER — Inpatient Hospital Stay (HOSPITAL_COMMUNITY)
Admission: EM | Admit: 2011-10-27 | Discharge: 2011-10-31 | DRG: 181 | Disposition: A | Discharge status: Home / Self Care | Payer: BC Managed Care – PPO | Attending: General Surgery | Admitting: General Surgery

## 2011-10-27 ENCOUNTER — Ambulatory Visit: Payer: BC Managed Care – PPO | Admitting: Family Medicine

## 2011-10-27 ENCOUNTER — Encounter (HOSPITAL_COMMUNITY): Payer: Self-pay | Admitting: Emergency Medicine

## 2011-10-27 DIAGNOSIS — R112 Nausea with vomiting, unspecified: Secondary | ICD-10-CM | POA: Diagnosis present

## 2011-10-27 DIAGNOSIS — M542 Cervicalgia: Secondary | ICD-10-CM | POA: Diagnosis present

## 2011-10-27 DIAGNOSIS — R51 Headache: Secondary | ICD-10-CM | POA: Diagnosis present

## 2011-10-27 DIAGNOSIS — K56609 Unspecified intestinal obstruction, unspecified as to partial versus complete obstruction: Principal | ICD-10-CM

## 2011-10-27 DIAGNOSIS — F3289 Other specified depressive episodes: Secondary | ICD-10-CM | POA: Diagnosis present

## 2011-10-27 DIAGNOSIS — J3489 Other specified disorders of nose and nasal sinuses: Secondary | ICD-10-CM | POA: Diagnosis present

## 2011-10-27 DIAGNOSIS — F329 Major depressive disorder, single episode, unspecified: Secondary | ICD-10-CM | POA: Diagnosis present

## 2011-10-27 DIAGNOSIS — G8929 Other chronic pain: Secondary | ICD-10-CM | POA: Diagnosis present

## 2011-10-27 DIAGNOSIS — I10 Essential (primary) hypertension: Secondary | ICD-10-CM | POA: Diagnosis present

## 2011-10-27 HISTORY — DX: Unspecified osteoarthritis, unspecified site: M19.90

## 2011-10-27 HISTORY — DX: Unspecified intestinal obstruction, unspecified as to partial versus complete obstruction: K56.609

## 2011-10-27 LAB — LIPASE, BLOOD: Lipase: 21 U/L (ref 11–59)

## 2011-10-27 LAB — CBC WITH DIFFERENTIAL/PLATELET
Eosinophils Absolute: 0.1 10*3/uL (ref 0.0–0.7)
Lymphs Abs: 2.5 10*3/uL (ref 0.7–4.0)
MCH: 32 pg (ref 26.0–34.0)
Neutro Abs: 4.7 10*3/uL (ref 1.7–7.7)
Neutrophils Relative %: 60 % (ref 43–77)
Platelets: 276 10*3/uL (ref 150–400)
RBC: 4.28 MIL/uL (ref 3.87–5.11)
WBC: 7.8 10*3/uL (ref 4.0–10.5)

## 2011-10-27 LAB — COMPREHENSIVE METABOLIC PANEL
AST: 24 U/L (ref 0–37)
BUN: 8 mg/dL (ref 6–23)
CO2: 26 mEq/L (ref 19–32)
Chloride: 99 mEq/L (ref 96–112)
Creatinine, Ser: 0.57 mg/dL (ref 0.50–1.10)
GFR calc Af Amer: >90 mL/min (ref >90)
GFR calc non Af Amer: >90 mL/min (ref >90)
Glucose, Bld: 136 mg/dL — ABNORMAL HIGH (ref 70–99)
Total Bilirubin: 0.4 mg/dL (ref 0.3–1.2)

## 2011-10-27 LAB — URINALYSIS, ROUTINE W REFLEX MICROSCOPIC
Bilirubin Urine: NEGATIVE
Glucose, UA: NEGATIVE mg/dL
Ketones, ur: NEGATIVE mg/dL
Protein, ur: NEGATIVE mg/dL
pH: 6.5 (ref 5.0–8.0)

## 2011-10-27 LAB — URINE MICROSCOPIC-ADD ON

## 2011-10-27 LAB — LACTIC ACID, PLASMA: Lactic Acid, Venous: 0.9 mmol/L (ref 0.5–2.2)

## 2011-10-27 MED ORDER — ONDANSETRON HCL 4 MG/2ML IJ SOLN
4.0000 mg | Freq: Once | INTRAMUSCULAR | Status: AC
  Administered 2011-10-27: 4 mg via INTRAVENOUS
  Filled 2011-10-27: qty 2

## 2011-10-27 MED ORDER — ONDANSETRON HCL 4 MG/2ML IJ SOLN
4.0000 mg | Freq: Four times a day (QID) | INTRAMUSCULAR | Status: DC | PRN
  Administered 2011-10-27 – 2011-10-29 (×4): 4 mg via INTRAVENOUS
  Filled 2011-10-27 (×4): qty 2

## 2011-10-27 MED ORDER — HYDROMORPHONE HCL PF 1 MG/ML IJ SOLN
1.0000 mg | Freq: Once | INTRAMUSCULAR | Status: AC
  Administered 2011-10-27: 1 mg via INTRAVENOUS
  Filled 2011-10-27: qty 1

## 2011-10-27 MED ORDER — INFLUENZA VIRUS VACC SPLIT PF IM SUSP
0.5000 mL | INTRAMUSCULAR | Status: AC
  Filled 2011-10-27 (×3): qty 0.5

## 2011-10-27 MED ORDER — SODIUM CHLORIDE 0.9 % IV SOLN
Freq: Once | INTRAVENOUS | Status: AC
  Administered 2011-10-27: 12:00:00 via INTRAVENOUS

## 2011-10-27 MED ORDER — DIPHENHYDRAMINE HCL 12.5 MG/5ML PO ELIX
12.5000 mg | ORAL_SOLUTION | Freq: Four times a day (QID) | ORAL | Status: DC | PRN
Start: 2011-10-27 — End: 2011-10-31
  Filled 2011-10-27: qty 5

## 2011-10-27 MED ORDER — MORPHINE SULFATE 2 MG/ML IJ SOLN
1.0000 mg | INTRAMUSCULAR | Status: DC | PRN
  Administered 2011-10-27 – 2011-10-29 (×6): 2 mg via INTRAVENOUS
  Filled 2011-10-27 (×6): qty 1

## 2011-10-27 MED ORDER — PHENOL 1.4 % MT LIQD
1.0000 | OROMUCOSAL | Status: DC | PRN
  Administered 2011-10-27: 1 via OROMUCOSAL
  Filled 2011-10-27: qty 177

## 2011-10-27 MED ORDER — LORAZEPAM 2 MG/ML IJ SOLN
1.0000 mg | Freq: Four times a day (QID) | INTRAMUSCULAR | Status: DC | PRN
  Administered 2011-10-27 – 2011-10-30 (×4): 1 mg via INTRAVENOUS
  Filled 2011-10-27 (×4): qty 1

## 2011-10-27 MED ORDER — SODIUM CHLORIDE 0.9 % IV BOLUS (SEPSIS)
1000.0000 mL | Freq: Once | INTRAVENOUS | Status: AC
  Administered 2011-10-27: 1000 mL via INTRAVENOUS

## 2011-10-27 MED ORDER — DIPHENHYDRAMINE HCL 50 MG/ML IJ SOLN
12.5000 mg | Freq: Four times a day (QID) | INTRAMUSCULAR | Status: DC | PRN
  Administered 2011-10-28: 12.5 mg via INTRAVENOUS
  Filled 2011-10-27: qty 1

## 2011-10-27 MED ORDER — KCL IN DEXTROSE-NACL 20-5-0.45 MEQ/L-%-% IV SOLN
INTRAVENOUS | Status: DC
  Administered 2011-10-27 – 2011-10-28 (×4): via INTRAVENOUS
  Administered 2011-10-29: 75 mL/h via INTRAVENOUS
  Administered 2011-10-29 (×2): via INTRAVENOUS
  Administered 2011-10-29: 75 mL/h via INTRAVENOUS
  Administered 2011-10-30: 10:00:00 via INTRAVENOUS
  Filled 2011-10-27 (×11): qty 1000

## 2011-10-27 NOTE — H&P (Signed)
ATTENDING ADDENDUM:  I personally reviewed patient's record, examined the patient, and formulated the following plan:  Abd distended but minimally tender NG placed  Admit to 6N NPO, IVF's Monitor UOP Rpt AAS in AM

## 2011-10-27 NOTE — ED Notes (Signed)
Pt vomiting at triage
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
## 2011-10-27 NOTE — ED Notes (Signed)
Patient transported to X-ray 
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
## 2011-10-27 NOTE — ED Notes (Signed)
Top of abd pain that started this am states vomited this am denies diarrhea or urinary s/s sharp pain and states that he abd is swelling

## 2011-10-28 ENCOUNTER — Inpatient Hospital Stay (HOSPITAL_COMMUNITY): Payer: BC Managed Care – PPO

## 2011-10-28 LAB — BASIC METABOLIC PANEL
BUN: 5 mg/dL — ABNORMAL LOW (ref 6–23)
CO2: 28 mEq/L (ref 19–32)
Calcium: 8.9 mg/dL (ref 8.4–10.5)
Chloride: 107 mEq/L (ref 96–112)
Creatinine, Ser: 0.53 mg/dL (ref 0.50–1.10)
GFR calc Af Amer: >90 mL/min (ref >90)
GFR calc non Af Amer: >90 mL/min (ref >90)
Glucose, Bld: 147 mg/dL — ABNORMAL HIGH (ref 70–99)
Potassium: 3.1 mEq/L — ABNORMAL LOW (ref 3.5–5.1)
Sodium: 141 mEq/L (ref 135–145)

## 2011-10-28 LAB — CBC
HCT: 39 % (ref 36.0–46.0)
Hemoglobin: 13.1 g/dL (ref 12.0–15.0)
MCH: 32.3 pg (ref 26.0–34.0)
MCHC: 33.6 g/dL (ref 30.0–36.0)
MCV: 96.3 fL (ref 78.0–100.0)
Platelets: 245 10*3/uL (ref 150–400)
RBC: 4.05 MIL/uL (ref 3.87–5.11)
RDW: 13.9 % (ref 11.5–15.5)
WBC: 6.2 10*3/uL (ref 4.0–10.5)

## 2011-10-28 MED ORDER — BIOTENE DRY MOUTH MT LIQD
15.0000 mL | Freq: Two times a day (BID) | OROMUCOSAL | Status: DC
  Administered 2011-10-28 – 2011-10-29 (×4): 15 mL via OROMUCOSAL

## 2011-10-28 MED ORDER — NADOLOL 20 MG PO TABS
20.0000 mg | ORAL_TABLET | Freq: Every day | ORAL | Status: DC
  Administered 2011-10-28 – 2011-10-31 (×4): 20 mg via ORAL
  Filled 2011-10-28 (×4): qty 1

## 2011-10-28 MED ORDER — CHLORHEXIDINE GLUCONATE 0.12 % MT SOLN
15.0000 mL | Freq: Two times a day (BID) | OROMUCOSAL | Status: DC
  Administered 2011-10-28 – 2011-10-30 (×6): 15 mL via OROMUCOSAL
  Filled 2011-10-28 (×4): qty 15

## 2011-10-28 MED ORDER — ACETAMINOPHEN 10 MG/ML IV SOLN
1000.0000 mg | Freq: Four times a day (QID) | INTRAVENOUS | Status: AC
  Administered 2011-10-28 – 2011-10-29 (×4): 1000 mg via INTRAVENOUS
  Filled 2011-10-28 (×4): qty 100

## 2011-10-28 MED ORDER — HYDROCHLOROTHIAZIDE 25 MG PO TABS
25.0000 mg | ORAL_TABLET | Freq: Every day | ORAL | Status: DC
  Administered 2011-10-28 – 2011-10-31 (×4): 25 mg via ORAL
  Filled 2011-10-28 (×5): qty 1

## 2011-10-28 MED ORDER — AMLODIPINE BESYLATE 10 MG PO TABS
10.0000 mg | ORAL_TABLET | Freq: Every day | ORAL | Status: DC
  Administered 2011-10-28 – 2011-10-31 (×4): 10 mg via ORAL
  Filled 2011-10-28 (×6): qty 1

## 2011-10-28 NOTE — Progress Notes (Signed)
Patient feeling better.  Abd less distended.  NG with min output.  Will d/c ng but keep NPO until bowel function resolves.

## 2011-10-28 NOTE — Progress Notes (Signed)
Md made awae of the gastric residual of about 10 ml.

## 2011-10-28 NOTE — Progress Notes (Signed)
Patient ID: [name], female   DOB: 01/26/1950, 61 y.o.   MRN: [id number]    Subjective: Pt with sinus headache, denies n/v, still with some abd pain.  Denies flatus.  Objective: Vital signs in last 24 hours: Temp:  [98 F (36.7 C)-98.4 F (36.9 C)] 98.4 F (36.9 C) (10/08 0559) Pulse Rate:  [70-80] 70  (10/08 0559) Resp:  [18-20] 20  (10/08 0559) BP: (144-190)/(65-84) 144/75 mmHg (10/08 0559) SpO2:  [92 %-100 %] 99 % (10/08 0559) Weight:  [127 lb (57.607 kg)] 127 lb (57.607 kg) (10/07 2049) Last BM Date: 10/26/11  Intake/Output from previous day: 10/07 0701 - 10/08 0700 In: 3840 [I.V.:3840] Out: 800 [Emesis/NG output:800] Intake/Output this shift:    PE: Abd: some guarding but softer then yesterday, faint bowel sounds, still tender esp on right side  Lab Results:   Basename 10/28/11 0623 10/27/11 1133  WBC 6.2 7.8  HGB 13.1 13.7  HCT 39.0 40.1  PLT 245 276   BMET  Basename 10/28/11 0623 10/27/11 1133  NA 141 136  K 3.1* 3.8  CL 107 99  CO2 28 26  GLUCOSE 147* 136*  BUN 5* 8  CREATININE 0.53 0.57  CALCIUM 8.9 9.8   PT/INR No results found for this basename: LABPROT:2,INR:2 in the last 72 hours CMP     Component Value Date/Time   NA 141 10/28/2011 0623   K 3.1* 10/28/2011 0623   CL 107 10/28/2011 0623   CO2 28 10/28/2011 0623   GLUCOSE 147* 10/28/2011 0623   BUN 5* 10/28/2011 0623   CREATININE 0.53 10/28/2011 0623   CALCIUM 8.9 10/28/2011 0623   PROT 7.8 10/27/2011 1133   ALBUMIN 4.2 10/27/2011 1133   AST 24 10/27/2011 1133   ALT 14 10/27/2011 1133   ALKPHOS 52 10/27/2011 1133   BILITOT 0.4 10/27/2011 1133   GFRNONAA >90 10/28/2011 0623   GFRAA >90 10/28/2011 0623   Lipase     Component Value Date/Time   LIPASE 21 10/27/2011 1133       Studies/Results: Dg Abd 1 View  10/28/2011  *RADIOLOGY REPORT*  Clinical Data: Small bowel obstruction, weakness.  ABDOMEN - 1 VIEW  Comparison: 10/27/2011  Findings: Dilated small bowel loops in the mid abdomen are  again noted, not significantly changed.  NG tube is in the stomach.  No free air, organomegaly or suspicious calcification.  IMPRESSION: Continued small bowel obstruction pattern, not significantly changed.   Original Report Authenticated By: Cyndie Chime, M.D.    Dg Abd Acute W/chest  10/27/2011  *RADIOLOGY REPORT*  Clinical Data: Abdominal pain with distention.  ACUTE ABDOMEN SERIES (ABDOMEN 2 VIEW & CHEST 1 VIEW)  Comparison: 01/29/2008  Findings: Cardiomegaly noted with tortuosity of the thoracic aorta. The lungs appear clear.  Lower cervical plate screw fixator noted.  No free burden of gas is evident beneath the hemidiaphragms.  There are dilated loops of small bowel in the right abdomen with air- fluid levels at differing vertical levels within the small bowel loops.  Loops measure up to 3.5 cm.  Gas and stool noted in the proximal colon.  IMPRESSION:  1.  Abnormal air-fluid levels in loops of mildly dilated small bowel primarily in the right abdomen.  Although localized ileus might have a similar appearance, air fluid levels are at differing vertical heights in some loops, favoring early small bowel obstruction. 2.  Cardiomegaly.   Original Report Authenticated By: Dellia Cloud, M.D.     Anti-infectives: Anti-infectives  None       Assessment/Plan  1.  SBO:  Films today shows some gas in the distal colon now, will try clamping trial and see how she does.  If n/v develops restart suction.  Check residuals in 3-4 hours.  Will give IV tylenol for headaches.  Repeat films in AM.      LOS: 1 day    [name] 10/28/2011

## 2011-10-29 ENCOUNTER — Inpatient Hospital Stay (HOSPITAL_COMMUNITY): Payer: BC Managed Care – PPO

## 2011-10-29 MED ORDER — WHITE PETROLATUM GEL
Status: AC
  Administered 2011-10-29: 0.2
  Filled 2011-10-29: qty 5

## 2011-10-29 MED ORDER — PROMETHAZINE HCL 25 MG/ML IJ SOLN
12.5000 mg | INTRAMUSCULAR | Status: DC | PRN
  Administered 2011-10-29: 12.5 mg via INTRAVENOUS
  Filled 2011-10-29: qty 1

## 2011-10-29 MED ORDER — INFLUENZA VIRUS VACC SPLIT PF IM SUSP
0.5000 mL | INTRAMUSCULAR | Status: AC
  Administered 2011-10-29: 0.5 mL via INTRAMUSCULAR

## 2011-10-29 NOTE — Progress Notes (Signed)
Patient ID: [name], female   DOB: 1950/11/15, 61 y.o.   MRN: [id number]     Subjective: Pt with some nausea last night and some this am but no vomiting, +flatus, some more abd pain then yesterday.  Objective: Vital signs in last 24 hours: Temp:  [98.1 F (36.7 C)-98.6 F (37 C)] 98.1 F (36.7 C) (10/09 0628) Pulse Rate:  [56-70] 63  (10/09 0628) Resp:  [18-20] 18  (10/09 0628) BP: (126-187)/(63-90) 126/63 mmHg (10/09 0628) SpO2:  [98 %-100 %] 98 % (10/09 0628) Last BM Date: 10/26/11  Intake/Output from previous day: 10/08 0701 - 10/09 0700 In: 3002 [I.V.:3002] Out: 50 [Emesis/NG output:50] Intake/Output this shift:    PE: Abd: some guarding, better bowel sounds, still tender esp on right side  Lab Results:   Basename 10/28/11 0623 10/27/11 1133  WBC 6.2 7.8  HGB 13.1 13.7  HCT 39.0 40.1  PLT 245 276   BMET  Basename 10/28/11 0623 10/27/11 1133  NA 141 136  K 3.1* 3.8  CL 107 99  CO2 28 26  GLUCOSE 147* 136*  BUN 5* 8  CREATININE 0.53 0.57  CALCIUM 8.9 9.8   PT/INR No results found for this basename: LABPROT:2,INR:2 in the last 72 hours CMP     Component Value Date/Time   NA 141 10/28/2011 0623   K 3.1* 10/28/2011 0623   CL 107 10/28/2011 0623   CO2 28 10/28/2011 0623   GLUCOSE 147* 10/28/2011 0623   BUN 5* 10/28/2011 0623   CREATININE 0.53 10/28/2011 0623   CALCIUM 8.9 10/28/2011 0623   PROT 7.8 10/27/2011 1133   ALBUMIN 4.2 10/27/2011 1133   AST 24 10/27/2011 1133   ALT 14 10/27/2011 1133   ALKPHOS 52 10/27/2011 1133   BILITOT 0.4 10/27/2011 1133   GFRNONAA >90 10/28/2011 0623   GFRAA >90 10/28/2011 0623   Lipase     Component Value Date/Time   LIPASE 21 10/27/2011 1133       Studies/Results: Dg Abd 1 View  10/28/2011  *RADIOLOGY REPORT*  Clinical Data: Small bowel obstruction, weakness.  ABDOMEN - 1 VIEW  Comparison: 10/27/2011  Findings: Dilated small bowel loops in the mid abdomen are again noted, not significantly changed.  NG tube is in the  stomach.  No free air, organomegaly or suspicious calcification.  IMPRESSION: Continued small bowel obstruction pattern, not significantly changed.   Original Report Authenticated By: Cyndie Chime, M.D.    Dg Abd Acute W/chest  10/27/2011  *RADIOLOGY REPORT*  Clinical Data: Abdominal pain with distention.  ACUTE ABDOMEN SERIES (ABDOMEN 2 VIEW & CHEST 1 VIEW)  Comparison: 01/29/2008  Findings: Cardiomegaly noted with tortuosity of the thoracic aorta. The lungs appear clear.  Lower cervical plate screw fixator noted.  No free burden of gas is evident beneath the hemidiaphragms.  There are dilated loops of small bowel in the right abdomen with air- fluid levels at differing vertical levels within the small bowel loops.  Loops measure up to 3.5 cm.  Gas and stool noted in the proximal colon.  IMPRESSION:  1.  Abnormal air-fluid levels in loops of mildly dilated small bowel primarily in the right abdomen.  Although localized ileus might have a similar appearance, air fluid levels are at differing vertical heights in some loops, favoring early small bowel obstruction. 2.  Cardiomegaly.   Original Report Authenticated By: Dellia Cloud, M.D.    Dg Abd Portable 1v  10/29/2011  *RADIOLOGY REPORT*  Clinical Data: Small bowel obstruction.  PORTABLE ABDOMEN - 1 VIEW  Comparison: 10/28/2011.  Findings: Persistent dilatation of small bowel loops with minimal colonic gas and stool.  IMPRESSION: Persistent small bowel obstruction.   Original Report Authenticated By: Reyes Ivan, M.D.     Anti-infectives: Anti-infectives    None       Assessment/Plan  1.  SBO:  Films today relatively unchanged, patient with more nausea last night but is having flatus, will keep on ice chips only today and repeat films in AM, NGT if vomiting develops.    LOS: 2 days    Dajanay Northrup 10/29/2011

## 2011-10-29 NOTE — Progress Notes (Signed)
ATTENDING ADDENDUM:  I personally reviewed patient's record, examined the patient, and formulated the following plan:  The patient appears to be slowly resolving her obstruction.  Some flatus yesterday but emesis this am.  We will cont NPO for now.  I have encouraged pt to continue ambulation.

## 2011-10-30 ENCOUNTER — Inpatient Hospital Stay (HOSPITAL_COMMUNITY): Payer: BC Managed Care – PPO

## 2011-10-30 MED ORDER — ACETAMINOPHEN 325 MG PO TABS
650.0000 mg | ORAL_TABLET | Freq: Four times a day (QID) | ORAL | Status: DC | PRN
  Administered 2011-10-30: 650 mg via ORAL
  Filled 2011-10-30: qty 2

## 2011-10-30 NOTE — Progress Notes (Signed)
Patient ID: [name], female   DOB: Jan 26, 1950, 61 y.o.   MRN: [id number]     Subjective: Pt feels much better today, had large BM last night, abd still sore but not painful like before, +flatus.  Objective: Vital signs in last 24 hours: Temp:  [98.1 F (36.7 C)-98.7 F (37.1 C)] 98.1 F (36.7 C) (10/10 0526) Pulse Rate:  [65-71] 71  (10/10 0526) Resp:  [17-18] 18  (10/10 0526) BP: (128-172)/(68-78) 128/78 mmHg (10/10 0526) SpO2:  [98 %-100 %] 100 % (10/10 0526) Last BM Date: 10/30/11  Intake/Output from previous day: 10/09 0701 - 10/10 0700 In: 1997.5 [I.V.:1997.5] Out: -  Intake/Output this shift:    PE: Abd: softer and less tender now, more active bowel sounds  Lab Results:   Basename 10/28/11 0623 10/27/11 1133  WBC 6.2 7.8  HGB 13.1 13.7  HCT 39.0 40.1  PLT 245 276   BMET  Basename 10/28/11 0623 10/27/11 1133  NA 141 136  K 3.1* 3.8  CL 107 99  CO2 28 26  GLUCOSE 147* 136*  BUN 5* 8  CREATININE 0.53 0.57  CALCIUM 8.9 9.8   PT/INR No results found for this basename: LABPROT:2,INR:2 in the last 72 hours CMP     Component Value Date/Time   NA 141 10/28/2011 0623   K 3.1* 10/28/2011 0623   CL 107 10/28/2011 0623   CO2 28 10/28/2011 0623   GLUCOSE 147* 10/28/2011 0623   BUN 5* 10/28/2011 0623   CREATININE 0.53 10/28/2011 0623   CALCIUM 8.9 10/28/2011 0623   PROT 7.8 10/27/2011 1133   ALBUMIN 4.2 10/27/2011 1133   AST 24 10/27/2011 1133   ALT 14 10/27/2011 1133   ALKPHOS 52 10/27/2011 1133   BILITOT 0.4 10/27/2011 1133   GFRNONAA >90 10/28/2011 0623   GFRAA >90 10/28/2011 0623   Lipase     Component Value Date/Time   LIPASE 21 10/27/2011 1133       Studies/Results: Dg Abd Portable 1v  10/30/2011  *RADIOLOGY REPORT*  Clinical Data: Small bowel obstruction, abdominal pain.  PORTABLE ABDOMEN - 1 VIEW  Comparison: 10/29/2011  Findings: Improving small bowel distention.  Continued mild distention of left abdominal small bowel loops.  Gas noted within  nondistended colon.  No free air organomegaly.  IMPRESSION: Improving small bowel obstruction pattern.   Original Report Authenticated By: Cyndie Chime, M.D.    Dg Abd Portable 1v  10/29/2011  *RADIOLOGY REPORT*  Clinical Data: Small bowel obstruction.  PORTABLE ABDOMEN - 1 VIEW  Comparison: 10/28/2011.  Findings: Persistent dilatation of small bowel loops with minimal colonic gas and stool.  IMPRESSION: Persistent small bowel obstruction.   Original Report Authenticated By: Reyes Ivan, M.D.     Anti-infectives: Anti-infectives    None       Assessment/Plan  1.  SBO:  Pt improving clinically and has had a BM, her films look better today too, will start clear liquid diet and see how see tolerates, possibly advance as tolerated tomorrow.  If no set backs, likely home in 24-48 hours.    LOS: 3 days    Tannya Gonet 10/30/2011

## 2011-10-30 NOTE — Progress Notes (Signed)
Tolerating clears.  Will advance diet in AM if continues to improve. Anticipate D/C tom.

## 2011-10-31 MED ORDER — HYDROCODONE-ACETAMINOPHEN 5-325 MG PO TABS: 1.0000 | ORAL_TABLET | Freq: Four times a day (QID) | ORAL | Status: DC | PRN

## 2011-10-31 NOTE — Discharge Summary (Signed)
  Physician Discharge Summary  Patient ID: [name] MRN: [id number] DOB/AGE: [birth year] 61 y.o.  Admit date: 10/27/2011 Discharge date: 10/31/2011  Admitting Diagnosis: Abdominal Pain, nausea and vomiting  Discharge Diagnosis Patient Active Problem List   Diagnosis Date Noted  . Contact dermatitis and eczema due to plant 12/26/2010  . NECK PAIN, CHRONIC 07/15/2007  . DEPRESSION 09/03/2006  . HYPERTENSION 09/03/2006  . ALLERGIC RHINITIS 09/03/2006  Small bowel obstruction  Consultants None  Procedures None  Hospital Course:  61 yr old female who presented to [facility] with 24 hour history of nausea, vomiting and abdominal pain.  Work up showed a SBO.  An NGT was placed in the ER and the patient was then admitted.  She was kept NPO and IV fluids were given.  Over the next several days the patient's NGT was able to be removed as her bowel function returned.  She was able to be advanced to a regular diet and was having BMs.  She was then felt stable for discharge home.     Medication List     As of 10/31/2011  9:07 AM    TAKE these medications         amLODipine 10 MG tablet   Commonly known as: NORVASC   Take 1 tablet (10 mg total) by mouth daily.      hydrochlorothiazide 25 MG tablet   Commonly known as: HYDRODIURIL   Take 1 tablet (25 mg total) by mouth daily.      HYDROcodone-acetaminophen 5-325 MG per tablet   Commonly known as: NORCO/VICODIN   Take 1 tablet by mouth every 6 (six) hours as needed for pain.      LORazepam 0.5 MG tablet   Commonly known as: ATIVAN   One tablet 3 times daily      nadolol 20 MG tablet   Commonly known as: CORGARD   Take 1 tablet (20 mg total) by mouth daily.      zolpidem 5 MG tablet   Commonly known as: AMBIEN   Take 2.5 mg by mouth at bedtime as needed. For sleep             Follow-up Information    Call Vanita Panda., MD. (As needed)    Contact information:   [address] [place]  [address] [phone]          Signed: Clance Boll [facility] [phone]  10/31/2011, 9:07 AM

## 2011-10-31 NOTE — Discharge Summary (Signed)
ATTENDING ADDENDUM:  I personally reviewed patient's record, examined the patient, and formulated the following plan:  Pt did well with conservative management.  D/C home if tolerates reg diet.

## 2011-10-31 NOTE — Progress Notes (Signed)
Patient discharged to home in care of spouse. Medications and instructions reviewed with patient with no questions. IV d/c'd with cath intact. Assessment unchanged from this am. Patient is to follow up as needed.

## 2011-10-31 NOTE — Discharge Instructions (Signed)
Intestinal Obstruction An intestinal obstruction comes from a physical blockage in the bowel. Different problems in the bowel may cause these blockages.  CAUSES   Adhesions from previous surgeries  Cancer or tumor  A hernia, which is a condition in which a portion of the bowel bulges out through an opening or weakness in the abdomen (belly). This sometimes squeezes the bowel.  A swallowed object.  Blockage (impaction) with worms is common in third world countries.  A twisting of the bowel or telescoping of a portion of the bowel into another portion (intussusceptions)  Anything that stops food from going through from the stomach to the anus. SYMPTOMS  Symptoms of bowel obstruction may result in your belly getting bigger (bloating), nausea, vomiting, explosive diarrhea, explosive stool. You may not be able to hear your normal bowel sounds (such as "growling in your stomach"). You may also stop having bowel movements or passing gas. DIAGNOSIS  Usually this condition is diagnosed with a history and an examination. Often, lab studies (blood work) and x-rays may be used to find the cause. TREATMENT  The main treatment of this condition is to rest the intestine (gut). Often times the obstruction may relieve itself and allow the gut to start working again. Think of the gut like a balloon that is blown up (filled with trapped food and water) which has squeezed into a hole or area which it cannot get through.   If the obstruction is complete, an NG tube (nasogastric) is passed through the nose and into the stomach. It is then connected to suction to keep the stomach emptied out. This also helps treat the nausea and vomiting.  If there is an imbalance in the electrolytes, they are corrected with intravenous fluids. These have the proper chemicals in them to correct the problem.  If the reason for the obstruction (blockage) does not get better with conservative (non-surgical) treatment, surgery may  be necessary. Sometimes, surgery is done immediately if your surgeon knows that the problem is not going to get better with conservative treatment. PROGNOSIS  Depending on what the problem is, most of these problems can be treated by your caregivers with good results. Your surgeon will discuss the best course of action to take, with you or your loved ones. FOLLOWING SURGERY Seek immediate medical attention if you have:  Increasing abdominal pain, repeated vomiting, dehydration or fainting.  Severe weakness, chest pain or back pain.  Blood in your vomit, stool or you have tarry stool. Document Released: 03/29/2003 Document Revised: 03/31/2011 Document Reviewed: 08/27/2007 [employer] Patient Information 2013 [employer].  

## 2011-11-01 ENCOUNTER — Other Ambulatory Visit: Payer: Self-pay | Admitting: Family Medicine

## 2011-11-01 NOTE — Telephone Encounter (Signed)
Lorazepam TID last filled 09/04/11, #100 with 4 refills at [facility]

## 2011-11-04 NOTE — Telephone Encounter (Signed)
Fleet Contras please call [facility] tomorrow

## 2011-11-06 ENCOUNTER — Other Ambulatory Visit (INDEPENDENT_AMBULATORY_CARE_PROVIDER_SITE_OTHER): Payer: Self-pay | Admitting: Internal Medicine

## 2011-11-07 ENCOUNTER — Telehealth (INDEPENDENT_AMBULATORY_CARE_PROVIDER_SITE_OTHER): Payer: Self-pay | Admitting: General Surgery

## 2011-11-07 NOTE — Telephone Encounter (Signed)
Can this patient have a refill ? 

## 2011-11-07 NOTE — Telephone Encounter (Signed)
Called patient regarding refill on pain medication.Marland KitchenMarland KitchenLMOM for patient to call [facility]cal T back..per Marisue Ivan if patient in that much pain then she needs and appt to see Dr.Thomas.Marland KitchenMarland Kitchen

## 2011-11-08 NOTE — Telephone Encounter (Signed)
Refills at pharmacy.

## 2012-01-09 ENCOUNTER — Inpatient Hospital Stay (HOSPITAL_COMMUNITY)
Admission: EM | Admit: 2012-01-09 | Discharge: 2012-01-14 | DRG: 813 | Disposition: A | Discharge status: Home / Self Care | Payer: BC Managed Care – PPO | Attending: Internal Medicine | Admitting: Internal Medicine

## 2012-01-09 ENCOUNTER — Observation Stay (HOSPITAL_COMMUNITY): Payer: BC Managed Care – PPO

## 2012-01-09 ENCOUNTER — Emergency Department (HOSPITAL_COMMUNITY): Payer: BC Managed Care – PPO

## 2012-01-09 ENCOUNTER — Encounter (HOSPITAL_COMMUNITY): Payer: Self-pay | Admitting: *Deleted

## 2012-01-09 DIAGNOSIS — E876 Hypokalemia: Secondary | ICD-10-CM

## 2012-01-09 DIAGNOSIS — D62 Acute posthemorrhagic anemia: Secondary | ICD-10-CM | POA: Diagnosis present

## 2012-01-09 DIAGNOSIS — K529 Noninfective gastroenteritis and colitis, unspecified: Secondary | ICD-10-CM

## 2012-01-09 DIAGNOSIS — R1032 Left lower quadrant pain: Secondary | ICD-10-CM

## 2012-01-09 DIAGNOSIS — E860 Dehydration: Secondary | ICD-10-CM | POA: Diagnosis present

## 2012-01-09 DIAGNOSIS — R1031 Right lower quadrant pain: Secondary | ICD-10-CM | POA: Diagnosis present

## 2012-01-09 DIAGNOSIS — M542 Cervicalgia: Secondary | ICD-10-CM

## 2012-01-09 DIAGNOSIS — R112 Nausea with vomiting, unspecified: Secondary | ICD-10-CM

## 2012-01-09 DIAGNOSIS — F3289 Other specified depressive episodes: Secondary | ICD-10-CM | POA: Diagnosis present

## 2012-01-09 DIAGNOSIS — F329 Major depressive disorder, single episode, unspecified: Secondary | ICD-10-CM | POA: Diagnosis present

## 2012-01-09 DIAGNOSIS — G894 Chronic pain syndrome: Secondary | ICD-10-CM | POA: Diagnosis present

## 2012-01-09 DIAGNOSIS — R109 Unspecified abdominal pain: Secondary | ICD-10-CM

## 2012-01-09 DIAGNOSIS — F17200 Nicotine dependence, unspecified, uncomplicated: Secondary | ICD-10-CM | POA: Diagnosis present

## 2012-01-09 DIAGNOSIS — J309 Allergic rhinitis, unspecified: Secondary | ICD-10-CM

## 2012-01-09 DIAGNOSIS — F410 Panic disorder [episodic paroxysmal anxiety] without agoraphobia: Secondary | ICD-10-CM | POA: Diagnosis present

## 2012-01-09 DIAGNOSIS — A09 Infectious gastroenteritis and colitis, unspecified: Principal | ICD-10-CM | POA: Diagnosis present

## 2012-01-09 DIAGNOSIS — K922 Gastrointestinal hemorrhage, unspecified: Secondary | ICD-10-CM

## 2012-01-09 DIAGNOSIS — L247 Irritant contact dermatitis due to plants, except food: Secondary | ICD-10-CM

## 2012-01-09 DIAGNOSIS — I10 Essential (primary) hypertension: Secondary | ICD-10-CM | POA: Diagnosis present

## 2012-01-09 DIAGNOSIS — K625 Hemorrhage of anus and rectum: Secondary | ICD-10-CM | POA: Diagnosis present

## 2012-01-09 LAB — URINALYSIS, ROUTINE W REFLEX MICROSCOPIC
Glucose, UA: NEGATIVE mg/dL
Specific Gravity, Urine: 1.021 (ref 1.005–1.030)

## 2012-01-09 LAB — CBC WITH DIFFERENTIAL/PLATELET
Basophils Absolute: 0 10*3/uL (ref 0.0–0.1)
Basophils Relative: 0 % (ref 0–1)
Eosinophils Absolute: 0 10*3/uL (ref 0.0–0.7)
HCT: 42 % (ref 36.0–46.0)
MCH: 33 pg (ref 26.0–34.0)
MCHC: 34.5 g/dL (ref 30.0–36.0)
Monocytes Absolute: 0.3 10*3/uL (ref 0.1–1.0)
Neutro Abs: 8.8 10*3/uL — ABNORMAL HIGH (ref 1.7–7.7)
RDW: 14.1 % (ref 11.5–15.5)

## 2012-01-09 LAB — COMPREHENSIVE METABOLIC PANEL
AST: 52 U/L — ABNORMAL HIGH (ref 0–37)
Albumin: 4.4 g/dL (ref 3.5–5.2)
BUN: 11 mg/dL (ref 6–23)
Calcium: 10.4 mg/dL (ref 8.4–10.5)
Chloride: 98 mEq/L (ref 96–112)
Creatinine, Ser: 0.69 mg/dL (ref 0.50–1.10)
Total Bilirubin: 0.4 mg/dL (ref 0.3–1.2)
Total Protein: 8.4 g/dL — ABNORMAL HIGH (ref 6.0–8.3)

## 2012-01-09 LAB — URINE MICROSCOPIC-ADD ON

## 2012-01-09 LAB — PROTIME-INR
INR: 0.86 (ref 0.00–1.49)
Prothrombin Time: 11.7 seconds (ref 11.6–15.2)

## 2012-01-09 LAB — BASIC METABOLIC PANEL
BUN: 6 mg/dL (ref 6–23)
Chloride: 105 mEq/L (ref 96–112)
Glucose, Bld: 123 mg/dL — ABNORMAL HIGH (ref 70–99)
Potassium: 3 mEq/L — ABNORMAL LOW (ref 3.5–5.1)
Sodium: 143 mEq/L (ref 135–145)

## 2012-01-09 LAB — LACTIC ACID, PLASMA: Lactic Acid, Venous: 3 mmol/L — ABNORMAL HIGH (ref 0.5–2.2)

## 2012-01-09 MED ORDER — ONDANSETRON HCL 4 MG/2ML IJ SOLN
4.0000 mg | Freq: Four times a day (QID) | INTRAMUSCULAR | Status: DC | PRN
  Administered 2012-01-09 – 2012-01-11 (×5): 4 mg via INTRAVENOUS
  Filled 2012-01-09 (×7): qty 2

## 2012-01-09 MED ORDER — LORAZEPAM 0.5 MG PO TABS: 0.5000 mg | ORAL_TABLET | Freq: Three times a day (TID) | ORAL | Status: DC | PRN

## 2012-01-09 MED ORDER — METRONIDAZOLE 500 MG PO TABS
500.0000 mg | ORAL_TABLET | Freq: Three times a day (TID) | ORAL | Status: DC
  Administered 2012-01-09 – 2012-01-14 (×16): 500 mg via ORAL
  Filled 2012-01-09 (×19): qty 1

## 2012-01-09 MED ORDER — ZOLPIDEM TARTRATE 5 MG PO TABS
2.5000 mg | ORAL_TABLET | Freq: Every evening | ORAL | Status: DC | PRN
  Administered 2012-01-09 – 2012-01-13 (×5): 2.5 mg via ORAL
  Filled 2012-01-09 (×5): qty 1

## 2012-01-09 MED ORDER — POTASSIUM CHLORIDE CRYS ER 20 MEQ PO TBCR
40.0000 meq | EXTENDED_RELEASE_TABLET | Freq: Once | ORAL | Status: AC
  Administered 2012-01-09: 40 meq via ORAL
  Filled 2012-01-09: qty 2

## 2012-01-09 MED ORDER — NADOLOL 20 MG PO TABS
20.0000 mg | ORAL_TABLET | Freq: Every day | ORAL | Status: DC
  Administered 2012-01-09 – 2012-01-14 (×6): 20 mg via ORAL
  Filled 2012-01-09 (×6): qty 1

## 2012-01-09 MED ORDER — ONDANSETRON HCL 4 MG/2ML IJ SOLN
4.0000 mg | Freq: Once | INTRAMUSCULAR | Status: AC
  Administered 2012-01-09: 4 mg via INTRAVENOUS
  Filled 2012-01-09: qty 2

## 2012-01-09 MED ORDER — ACETAMINOPHEN 325 MG PO TABS: 650.0000 mg | ORAL_TABLET | Freq: Four times a day (QID) | ORAL | Status: DC | PRN

## 2012-01-09 MED ORDER — FENTANYL CITRATE 0.05 MG/ML IJ SOLN: 50.0000 ug | INTRAMUSCULAR | Status: DC | PRN

## 2012-01-09 MED ORDER — AMLODIPINE BESYLATE 10 MG PO TABS
10.0000 mg | ORAL_TABLET | Freq: Every day | ORAL | Status: DC
  Administered 2012-01-09 – 2012-01-14 (×6): 10 mg via ORAL
  Filled 2012-01-09 (×6): qty 1

## 2012-01-09 MED ORDER — SODIUM CHLORIDE 0.9 % IV SOLN: INTRAVENOUS | Status: DC

## 2012-01-09 MED ORDER — FENTANYL CITRATE 0.05 MG/ML IJ SOLN
100.0000 ug | Freq: Once | INTRAMUSCULAR | Status: AC
  Administered 2012-01-09: 100 ug via INTRAVENOUS
  Filled 2012-01-09: qty 2

## 2012-01-09 MED ORDER — PANTOPRAZOLE SODIUM 40 MG IV SOLR
40.0000 mg | INTRAVENOUS | Status: DC
  Administered 2012-01-09 – 2012-01-11 (×3): 40 mg via INTRAVENOUS
  Filled 2012-01-09 (×4): qty 40

## 2012-01-09 MED ORDER — IOHEXOL 300 MG/ML  SOLN
80.0000 mL | Freq: Once | INTRAMUSCULAR | Status: AC | PRN
  Administered 2012-01-09: 80 mL via INTRAVENOUS

## 2012-01-09 MED ORDER — HYDROCHLOROTHIAZIDE 25 MG PO TABS
25.0000 mg | ORAL_TABLET | Freq: Every day | ORAL | Status: DC
  Administered 2012-01-09 – 2012-01-14 (×6): 25 mg via ORAL
  Filled 2012-01-09 (×6): qty 1

## 2012-01-09 MED ORDER — SODIUM CHLORIDE 0.9 % IV SOLN: 1000.0000 mL | INTRAVENOUS | Status: DC

## 2012-01-09 MED ORDER — SODIUM CHLORIDE 0.9 % IV SOLN
INTRAVENOUS | Status: DC
  Administered 2012-01-09 – 2012-01-10 (×3): via INTRAVENOUS
  Administered 2012-01-10: 1000 mL via INTRAVENOUS
  Administered 2012-01-11 (×3): via INTRAVENOUS

## 2012-01-09 MED ORDER — CIPROFLOXACIN IN D5W 400 MG/200ML IV SOLN
400.0000 mg | Freq: Two times a day (BID) | INTRAVENOUS | Status: DC
  Administered 2012-01-09 (×2): 400 mg via INTRAVENOUS
  Filled 2012-01-09 (×4): qty 200

## 2012-01-09 MED ORDER — ONDANSETRON HCL 4 MG PO TABS
4.0000 mg | ORAL_TABLET | Freq: Four times a day (QID) | ORAL | Status: DC | PRN
  Administered 2012-01-11 – 2012-01-14 (×4): 4 mg via ORAL
  Filled 2012-01-09 (×4): qty 1

## 2012-01-09 MED ORDER — SODIUM CHLORIDE 0.9 % IV SOLN
1000.0000 mL | Freq: Once | INTRAVENOUS | Status: AC
  Administered 2012-01-09: 1000 mL via INTRAVENOUS

## 2012-01-09 MED ORDER — SODIUM CHLORIDE 0.9 % IV BOLUS (SEPSIS)
1000.0000 mL | Freq: Once | INTRAVENOUS | Status: AC
  Administered 2012-01-09: 1000 mL via INTRAVENOUS

## 2012-01-09 MED ORDER — NICOTINE 14 MG/24HR TD PT24
14.0000 mg | MEDICATED_PATCH | Freq: Every day | TRANSDERMAL | Status: DC
  Administered 2012-01-09 – 2012-01-14 (×4): 14 mg via TRANSDERMAL
  Filled 2012-01-09 (×6): qty 1

## 2012-01-09 MED ORDER — ACETAMINOPHEN 650 MG RE SUPP: 650.0000 mg | Freq: Four times a day (QID) | RECTAL | Status: DC | PRN

## 2012-01-09 MED ORDER — ONDANSETRON HCL 4 MG/2ML IJ SOLN: 4.0000 mg | Freq: Three times a day (TID) | INTRAMUSCULAR | Status: DC | PRN

## 2012-01-09 MED ORDER — ACETAMINOPHEN 160 MG/5ML PO SOLN
650.0000 mg | Freq: Four times a day (QID) | ORAL | Status: DC | PRN
  Administered 2012-01-09: 650 mg via ORAL
  Filled 2012-01-09 (×2): qty 20.3

## 2012-01-09 MED ORDER — TRAMADOL HCL 50 MG PO TABS
50.0000 mg | ORAL_TABLET | Freq: Four times a day (QID) | ORAL | Status: DC | PRN
  Administered 2012-01-09 – 2012-01-10 (×2): 50 mg via ORAL
  Filled 2012-01-09 (×3): qty 1

## 2012-01-09 NOTE — ED Notes (Signed)
Pt reports woke up this am with nausea/vomiting/diarrhea and blood in stool. Reports 5-6 episodes of diarrhea. Vomited numerous times. Pt reports generalized abdominal pain. Hx of partial SBO in October.

## 2012-01-09 NOTE — ED Notes (Signed)
Pt reports another episode of bright red blood in stool.

## 2012-01-09 NOTE — ED Notes (Signed)
Patient transported to X-ray 
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
## 2012-01-09 NOTE — ED Notes (Signed)
Pt ambulated to restroom. 
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
## 2012-01-09 NOTE — ED Notes (Signed)
Pt reports cranberry red blood in stool when ambulated to restroom, loose stool. Pt c/o pain again. Pain 6/10. Will make MD aware.

## 2012-01-10 DIAGNOSIS — R109 Unspecified abdominal pain: Secondary | ICD-10-CM

## 2012-01-10 DIAGNOSIS — K529 Noninfective gastroenteritis and colitis, unspecified: Secondary | ICD-10-CM

## 2012-01-10 DIAGNOSIS — E876 Hypokalemia: Secondary | ICD-10-CM

## 2012-01-10 LAB — CBC
HCT: 31.6 % — ABNORMAL LOW (ref 36.0–46.0)
Hemoglobin: 10.6 g/dL — ABNORMAL LOW (ref 12.0–15.0)
MCH: 32.8 pg (ref 26.0–34.0)
MCHC: 33.5 g/dL (ref 30.0–36.0)
MCV: 97.8 fL (ref 78.0–100.0)
RDW: 15.2 % (ref 11.5–15.5)

## 2012-01-10 LAB — CBC WITH DIFFERENTIAL/PLATELET
Basophils Absolute: 0 10*3/uL (ref 0.0–0.1)
Basophils Relative: 0 % (ref 0–1)
Eosinophils Relative: 0 % (ref 0–5)
HCT: 32.4 % — ABNORMAL LOW (ref 36.0–46.0)
Lymphocytes Relative: 13 % (ref 12–46)
MCHC: 34 g/dL (ref 30.0–36.0)
MCV: 96.1 fL (ref 78.0–100.0)
Monocytes Absolute: 1.1 10*3/uL — ABNORMAL HIGH (ref 0.1–1.0)
Platelets: 210 10*3/uL (ref 150–400)
RDW: 14.8 % (ref 11.5–15.5)
WBC: 15.3 10*3/uL — ABNORMAL HIGH (ref 4.0–10.5)

## 2012-01-10 LAB — COMPREHENSIVE METABOLIC PANEL
ALT: 25 U/L (ref 0–35)
Albumin: 3 g/dL — ABNORMAL LOW (ref 3.5–5.2)
Alkaline Phosphatase: 45 U/L (ref 39–117)
BUN: 4 mg/dL — ABNORMAL LOW (ref 6–23)
Chloride: 104 mEq/L (ref 96–112)
GFR calc Af Amer: >90 mL/min (ref >90)
Glucose, Bld: 116 mg/dL — ABNORMAL HIGH (ref 70–99)
Potassium: 2.8 mEq/L — ABNORMAL LOW (ref 3.5–5.1)
Sodium: 140 mEq/L (ref 135–145)
Total Bilirubin: 0.4 mg/dL (ref 0.3–1.2)

## 2012-01-10 LAB — LACTIC ACID, PLASMA: Lactic Acid, Venous: 0.8 mmol/L (ref 0.5–2.2)

## 2012-01-10 MED ORDER — CIPROFLOXACIN HCL 500 MG PO TABS
500.0000 mg | ORAL_TABLET | Freq: Two times a day (BID) | ORAL | Status: DC
  Administered 2012-01-10 – 2012-01-14 (×9): 500 mg via ORAL
  Filled 2012-01-10 (×11): qty 1

## 2012-01-10 MED ORDER — POTASSIUM CHLORIDE 10 MEQ/100ML IV SOLN
10.0000 meq | INTRAVENOUS | Status: AC
  Administered 2012-01-10 (×3): 10 meq via INTRAVENOUS
  Filled 2012-01-10 (×2): qty 100

## 2012-01-10 MED ORDER — POTASSIUM CHLORIDE 10 MEQ/100ML IV SOLN
10.0000 meq | INTRAVENOUS | Status: DC
  Filled 2012-01-10 (×6): qty 100

## 2012-01-10 MED ORDER — OXYCODONE HCL 5 MG PO TABS
5.0000 mg | ORAL_TABLET | ORAL | Status: DC | PRN
  Administered 2012-01-10 – 2012-01-14 (×11): 5 mg via ORAL
  Filled 2012-01-10 (×12): qty 1

## 2012-01-10 MED ORDER — POTASSIUM CHLORIDE CRYS ER 20 MEQ PO TBCR
40.0000 meq | EXTENDED_RELEASE_TABLET | ORAL | Status: AC
  Administered 2012-01-10 (×3): 40 meq via ORAL
  Filled 2012-01-10 (×3): qty 2

## 2012-01-10 NOTE — Progress Notes (Signed)
[facility] PROGRESS NOTE  [name] WUJ:[id number] DOB: 21-Jan-1951 DOA: 01/09/2012 PCP: Evette Georges, MD  Assessment/Plan: 1. Colitis with bloody stools- appreciate GI's assistance, cipro/flagyl, c-diff negative, other stool studies pending, clear liquids, may need colonoscopy soon 2. Pain control- PO PRN medications 3. hypokalmeia- replace, place on tele for now  Code Status: full Family Communication: husband at bedside Disposition Plan: home when work up complete   Consultants:  GI  Procedures:    Antibiotics:  Cipro/flagyl  HPI/Subjective: Still with lower abd pain Requesting liquids to eat  Objective: Filed Vitals:   01/09/12 1200 01/09/12 1253 01/09/12 2122 01/10/12 0513  BP: 152/81 137/68 111/70 124/78  Pulse: 89 88 81 73  Temp:  97.1 F (36.2 C) 98.2 F (36.8 C) 98.2 F (36.8 C)  TempSrc:  Oral Oral Oral  Resp:  16 18 18   Height:  5' (1.524 m)    Weight:  56.2 kg (123 lb 14.4 oz)    SpO2: 97% 98% 97% 98%    Intake/Output Summary (Last 24 hours) at 01/10/12 1035 Last data filed at 01/09/12 1700  Gross per 24 hour  Intake    300 ml  Output      0 ml  Net    300 ml   Filed Weights   01/09/12 1253  Weight: 56.2 kg (123 lb 14.4 oz)    Exam:   General:  A+Ox3, NAD  Cardiovascular: rrr  Respiratory: clear anterior  Abdomen: +Bs, soft, NT.ND  Data Reviewed: Basic Metabolic Panel:  Lab 01/10/12 9562 01/09/12 1736 01/09/12 0842  NA 140 143 142  K 2.8* 3.0* 3.0*  CL 104 105 98  CO2 26 27 27   GLUCOSE 116* 123* 155*  BUN 4* 6 11  CREATININE 0.59 0.57 0.69  CALCIUM 8.3* 8.5 10.4  MG 2.1 -- --  PHOS -- -- --   Liver Function Tests:  Lab 01/10/12 0710 01/09/12 0842  AST 24 52*  ALT 25 47*  ALKPHOS 45 63  BILITOT 0.4 0.4  PROT 6.1 8.4*  ALBUMIN 3.0* 4.4    Lab 01/09/12 0842  LIPASE 22  AMYLASE --   No results found for this basename: AMMONIA:5 in the last 168 hours CBC:  Lab 01/10/12 0710 01/09/12 0842   WBC 15.3* 10.3  NEUTROABS 12.2* 8.8*  HGB 11.0* 14.5  HCT 32.4* 42.0  MCV 96.1 95.5  PLT 210 273   Cardiac Enzymes: No results found for this basename: CKTOTAL:5,CKMB:5,CKMBINDEX:5,TROPONINI:5 in the last 168 hours BNP (last 3 results) No results found for this basename: PROBNP:3 in the last 8760 hours CBG: No results found for this basename: GLUCAP:5 in the last 168 hours  Recent Results (from the past 240 hour(s))  CLOSTRIDIUM DIFFICILE BY PCR     Status: Normal   Collection Time   01/09/12  1:50 PM      Component Value Range Status Comment   C difficile by pcr NEGATIVE  NEGATIVE Final      Studies: Ct Abdomen Pelvis W Contrast  01/09/2012  *RADIOLOGY REPORT*  Clinical Data: Abdominal pain.  Nausea, vomiting and diarrhea. Bright red blood in stools.  History of small bowel obstruction October.  Prior hysterectomy.  CT ABDOMEN AND PELVIS WITH CONTRAST  Technique:  Multidetector CT imaging of the abdomen and pelvis was performed following the standard protocol during bolus administration of intravenous contrast.  Contrast: 80mL OMNIPAQUE IOHEXOL 300 MG/ML  SOLN  Comparison: 01/29/2008.  Findings: Inferior lingula 4.9 mm nodule (series 3 image 6).  This appears minimally different than the prior examination (may be related to differences in sectioning plane).  Follow-up 6 -12 months recommended.  Basilar subsegmental atelectatic changes.  Diffuse inflammation/thickening of the transverse colon and left colon.  Findings consistent with colitis, etiology indeterminate with considerations including that secondary to; ischemia, inflammatory process or infectious process.  No free intraperitoneal air.  Small amount of free fluid in the pelvis.  Atherosclerotic type changes of the aorta without aneurysmal dilation.  Common origin and celiac artery superior mesenteric artery.  Flow is seen within the inferior mesenteric artery. Atherosclerotic type changes common iliac arteries.  No focal hepatic,  splenic, pancreatic or adrenal lesion.  No hydronephrosis.  Low density structures within the kidneys too small to adequately characterize possibly cysts.  Degenerative changes lumbar spine most notable L5-S1.  No bony destructive lesion.  Partially contracted noncontrast filled imaging of the urinary bladder without gross abnormality.  Post hysterectomy.  Full gallbladder without calcified gallstone.  IMPRESSION: Prominent colitis transverse colon and left colon.  Please see above  Critical Value/emergent results were called by telephone at the time of interpretation on 01/09/2012 at 12:38 p.m. to [facility] physician's assistant, who verbally acknowledged these results.   Original Report Authenticated By: Lacy Duverney, M.D.    Dg Abd Acute W/chest  01/09/2012  *RADIOLOGY REPORT*  Clinical Data: Nausea, vomiting, diarrhea and rectal bleeding. Abdominal pain.  ACUTE ABDOMEN SERIES (ABDOMEN 2 VIEW & CHEST 1 VIEW)  Comparison: Abdomen one-view 10/30/2011 and chest radiograph 12/01/2008.  Findings: Frontal view of the chest shows midline trachea and normal heart size.  Lungs are clear.  No pleural fluid.  Two views of the abdomen show a relative paucity of gas in the abdomen.  No gaseous distention of small bowel.  No free air.  IMPRESSION: Paucity of gas in the abdomen can be seen in the setting of vomiting.   Original Report Authenticated By: Leanna Battles, M.D.     Scheduled Meds:   . amLODipine  10 mg Oral Daily  . ciprofloxacin  500 mg Oral BID  . hydrochlorothiazide  25 mg Oral Daily  . metroNIDAZOLE  500 mg Oral Q8H  . nadolol  20 mg Oral Daily  . nicotine  14 mg Transdermal Daily  . pantoprazole (PROTONIX) IV  40 mg Intravenous Q24H  . potassium chloride  10 mEq Intravenous Q1 Hr x 3  . potassium chloride  40 mEq Oral Q4H   Continuous Infusions:   . sodium chloride 1,000 mL (01/10/12 0933)    Principal Problem:  *Abdominal pain, bilateral lower quadrant Active Problems:   DEPRESSION  HYPERTENSION  Bright red blood per rectum    Time spent: 25    [facility], [name]  [facility] Pager [phone]. If 8PM-8AM, please contact night-coverage at [email or web address], password [facility] 01/10/2012, 10:35 AM  LOS: 1 day

## 2012-01-10 NOTE — Progress Notes (Signed)
Subjective: Still having bloody diarrhea. Lower abdominal cramps. Symptoms overall about the same since admission yesterday.  Objective: Vital signs in last 24 hours: Temp:  [97.1 F (36.2 C)-98.2 F (36.8 C)] 98.2 F (36.8 C) (12/21 0513) Pulse Rate:  [73-88] 73  (12/21 0513) Resp:  [16-18] 18  (12/21 0513) BP: (111-137)/(68-78) 124/78 mmHg (12/21 0513) SpO2:  [97 %-98 %] 98 % (12/21 0513) Weight:  [56.2 kg (123 lb 14.4 oz)] 56.2 kg (123 lb 14.4 oz) (12/20 1253) Weight change:  Last BM Date: 01/09/12  PE: GEN:  NAD ABD:  Mild lower abdominal tenderness; somewhat hyperactive bowel sounds.  Lab Results: CBC    Component Value Date/Time   WBC 15.3* 01/10/2012 0710   RBC 3.37* 01/10/2012 0710   HGB 11.0* 01/10/2012 0710   HCT 32.4* 01/10/2012 0710   PLT 210 01/10/2012 0710   MCV 96.1 01/10/2012 0710   MCH 32.6 01/10/2012 0710   MCHC 34.0 01/10/2012 0710   RDW 14.8 01/10/2012 0710   LYMPHSABS 2.0 01/10/2012 0710   MONOABS 1.1* 01/10/2012 0710   EOSABS 0.1 01/10/2012 0710   BASOSABS 0.0 01/10/2012 0710   Stool studies:  C diff PCR negative; GI pathogen panel otherwise pending.  Assessment:  1.  Bloody diarrhea.  Suspect infectious colitis.  Ischemic or inflammatory colitis are less likely considerations. 2.  Abnormal CT abd/pelvis, descending and transverse colon thickening, likely reflective of colitis.  Plan:  1.  Continue hydration, antibiotics. 2.  Awaiting stool studies (C diff PCR negative, others pending). 3.  Sips clears. 4.  If no significant improvement over the next couple days, consider colonoscopy next week. 5.  Will follow.   [name] 01/10/2012, 12:00 PM

## 2012-01-11 DIAGNOSIS — K5289 Other specified noninfective gastroenteritis and colitis: Secondary | ICD-10-CM

## 2012-01-11 LAB — COMPREHENSIVE METABOLIC PANEL
ALT: 18 U/L (ref 0–35)
BUN: 3 mg/dL — ABNORMAL LOW (ref 6–23)
CO2: 25 mEq/L (ref 19–32)
Calcium: 8.4 mg/dL (ref 8.4–10.5)
Creatinine, Ser: 0.68 mg/dL (ref 0.50–1.10)
GFR calc Af Amer: >90 mL/min (ref >90)
GFR calc non Af Amer: >90 mL/min (ref >90)
Glucose, Bld: 110 mg/dL — ABNORMAL HIGH (ref 70–99)
Total Protein: 5.7 g/dL — ABNORMAL LOW (ref 6.0–8.3)

## 2012-01-11 LAB — CBC
HCT: 29.5 % — ABNORMAL LOW (ref 36.0–46.0)
Hemoglobin: 9.8 g/dL — ABNORMAL LOW (ref 12.0–15.0)
MCHC: 33.2 g/dL (ref 30.0–36.0)
WBC: 15.3 10*3/uL — ABNORMAL HIGH (ref 4.0–10.5)

## 2012-01-11 NOTE — Progress Notes (Signed)
UR Completed.  [name] Jane [phone] 01/11/2012  

## 2012-01-11 NOTE — Progress Notes (Signed)
[facility] PROGRESS NOTE  [name] ZOX:[id number] DOB: 08-Aug-1950 DOA: 01/09/2012 PCP: Evette Georges, MD  Assessment/Plan: 1. Colitis with bloody stools- appreciate GI's assistance, cipro/flagyl, c-diff negative, other stool studies pending, clear liquids, may need colonoscopy soon 2. Pain control- PO PRN medications 3. hypokalmeia- replace, place on tele for now, await BMP 4. Leukocytosis- monitor with CBC- afebrile  Code Status: full Family Communication: husband at bedside Disposition Plan: home when work up complete   Consultants:  GI  Procedures:    Antibiotics:  Cipro/flagyl  HPI/Subjective: Still with lower abd pain- not much better than yesterday but is tolerating food   Objective: Filed Vitals:   01/10/12 0513 01/10/12 1524 01/10/12 2215 01/11/12 0517  BP: 124/78 120/67 106/66 133/75  Pulse: 73 77 74 75  Temp: 98.2 F (36.8 C) 99 F (37.2 C) 98.4 F (36.9 C) 98.8 F (37.1 C)  TempSrc: Oral Oral Oral Oral  Resp: 18 18 20 18   Height:      Weight:      SpO2: 98% 97% 96% 95%    Intake/Output Summary (Last 24 hours) at 01/11/12 0907 Last data filed at 01/11/12 8119  Gross per 24 hour  Intake 4935.83 ml  Output      0 ml  Net 4935.83 ml   Filed Weights   01/09/12 1253  Weight: 56.2 kg (123 lb 14.4 oz)    Exam:   General:  A+Ox3, NAD  Cardiovascular: rrr  Respiratory: clear anterior  Abdomen: +Bs, soft, NT.ND  Data Reviewed: Basic Metabolic Panel:  Lab 01/10/12 1478 01/09/12 1736 01/09/12 0842  NA 140 143 142  K 2.8* 3.0* 3.0*  CL 104 105 98  CO2 26 27 27   GLUCOSE 116* 123* 155*  BUN 4* 6 11  CREATININE 0.59 0.57 0.69  CALCIUM 8.3* 8.5 10.4  MG 2.1 -- --  PHOS -- -- --   Liver Function Tests:  Lab 01/10/12 0710 01/09/12 0842  AST 24 52*  ALT 25 47*  ALKPHOS 45 63  BILITOT 0.4 0.4  PROT 6.1 8.4*  ALBUMIN 3.0* 4.4    Lab 01/09/12 0842  LIPASE 22  AMYLASE --   No results found for this basename:  AMMONIA:5 in the last 168 hours CBC:  Lab 01/10/12 1832 01/10/12 0710 01/09/12 0842  WBC 17.4* 15.3* 10.3  NEUTROABS -- 12.2* 8.8*  HGB 10.6* 11.0* 14.5  HCT 31.6* 32.4* 42.0  MCV 97.8 96.1 95.5  PLT 196 210 273   Cardiac Enzymes: No results found for this basename: CKTOTAL:5,CKMB:5,CKMBINDEX:5,TROPONINI:5 in the last 168 hours BNP (last 3 results) No results found for this basename: PROBNP:3 in the last 8760 hours CBG: No results found for this basename: GLUCAP:5 in the last 168 hours  Recent Results (from the past 240 hour(s))  CLOSTRIDIUM DIFFICILE BY PCR     Status: Normal   Collection Time   01/09/12  1:50 PM      Component Value Range Status Comment   C difficile by pcr NEGATIVE  NEGATIVE Final   STOOL CULTURE     Status: Normal (Preliminary result)   Collection Time   01/09/12  1:50 PM      Component Value Range Status Comment   Specimen Description STOOL   Final    Special Requests NONE   Final    Culture Culture reincubated for better growth   Final    Report Status PENDING   Incomplete      Studies: Ct Abdomen Pelvis W Contrast  01/09/2012  *  RADIOLOGY REPORT*  Clinical Data: Abdominal pain.  Nausea, vomiting and diarrhea. Bright red blood in stools.  History of small bowel obstruction October.  Prior hysterectomy.  CT ABDOMEN AND PELVIS WITH CONTRAST  Technique:  Multidetector CT imaging of the abdomen and pelvis was performed following the standard protocol during bolus administration of intravenous contrast.  Contrast: 80mL OMNIPAQUE IOHEXOL 300 MG/ML  SOLN  Comparison: 01/29/2008.  Findings: Inferior lingula 4.9 mm nodule (series 3 image 6).  This appears minimally different than the prior examination (may be related to differences in sectioning plane).  Follow-up 6 -12 months recommended.  Basilar subsegmental atelectatic changes.  Diffuse inflammation/thickening of the transverse colon and left colon.  Findings consistent with colitis, etiology indeterminate with  considerations including that secondary to; ischemia, inflammatory process or infectious process.  No free intraperitoneal air.  Small amount of free fluid in the pelvis.  Atherosclerotic type changes of the aorta without aneurysmal dilation.  Common origin and celiac artery superior mesenteric artery.  Flow is seen within the inferior mesenteric artery. Atherosclerotic type changes common iliac arteries.  No focal hepatic, splenic, pancreatic or adrenal lesion.  No hydronephrosis.  Low density structures within the kidneys too small to adequately characterize possibly cysts.  Degenerative changes lumbar spine most notable L5-S1.  No bony destructive lesion.  Partially contracted noncontrast filled imaging of the urinary bladder without gross abnormality.  Post hysterectomy.  Full gallbladder without calcified gallstone.  IMPRESSION: Prominent colitis transverse colon and left colon.  Please see above  Critical Value/emergent results were called by telephone at the time of interpretation on 01/09/2012 at 12:38 p.m. to [facility] physician's assistant, who verbally acknowledged these results.   Original Report Authenticated By: Lacy Duverney, M.D.    Dg Abd Acute W/chest  01/09/2012  *RADIOLOGY REPORT*  Clinical Data: Nausea, vomiting, diarrhea and rectal bleeding. Abdominal pain.  ACUTE ABDOMEN SERIES (ABDOMEN 2 VIEW & CHEST 1 VIEW)  Comparison: Abdomen one-view 10/30/2011 and chest radiograph 12/01/2008.  Findings: Frontal view of the chest shows midline trachea and normal heart size.  Lungs are clear.  No pleural fluid.  Two views of the abdomen show a relative paucity of gas in the abdomen.  No gaseous distention of small bowel.  No free air.  IMPRESSION: Paucity of gas in the abdomen can be seen in the setting of vomiting.   Original Report Authenticated By: Leanna Battles, M.D.     Scheduled Meds:    . amLODipine  10 mg Oral Daily  . ciprofloxacin  500 mg Oral BID  . hydrochlorothiazide  25 mg Oral  Daily  . metroNIDAZOLE  500 mg Oral Q8H  . nadolol  20 mg Oral Daily  . nicotine  14 mg Transdermal Daily  . pantoprazole (PROTONIX) IV  40 mg Intravenous Q24H   Continuous Infusions:    . sodium chloride 125 mL/hr at 01/11/12 1610    Principal Problem:  *Abdominal pain, bilateral lower quadrant Active Problems:  DEPRESSION  HYPERTENSION  Bright red blood per rectum  Hypokalemia  Colitis    Time spent: 25    Marlin Canary  [facility] Pager [phone]. If 8PM-8AM, please contact night-coverage at [email or web address], password [facility] 01/11/2012, 9:07 AM  LOS: 2 days

## 2012-01-11 NOTE — Progress Notes (Signed)
Subjective: Pain slowly improving. Had three loose stools with blood yesterday.  Objective: Vital signs in last 24 hours: Temp:  [98.4 F (36.9 C)-99 F (37.2 C)] 98.8 F (37.1 C) (12/22 0517) Pulse Rate:  [74-77] 75  (12/22 0517) Resp:  [18-20] 18  (12/22 0517) BP: (106-133)/(66-75) 133/75 mmHg (12/22 0517) SpO2:  [95 %-97 %] 95 % (12/22 0517) Weight change:  Last BM Date: 01/10/12  PE: GEN:  NAD ABD:  Mild generalized tenderness without peritonitis  Lab Results: CBC    Component Value Date/Time   WBC 15.3* 01/11/2012 0835   RBC 2.98* 01/11/2012 0835   HGB 9.8* 01/11/2012 0835   HCT 29.5* 01/11/2012 0835   PLT 176 01/11/2012 0835   MCV 99.0 01/11/2012 0835   MCH 32.9 01/11/2012 0835   MCHC 33.2 01/11/2012 0835   RDW 15.4 01/11/2012 0835   LYMPHSABS 2.0 01/10/2012 0710   MONOABS 1.1* 01/10/2012 0710   EOSABS 0.1 01/10/2012 0710   BASOSABS 0.0 01/10/2012 0710   Studies/Results: Stool C diff PCR negative; fecal infectious pathogen panel pending  Assessment:  1.  Bloody diarrhea with colitis seen on CT scan, suspect infectious colitis, slowly improving.  Ischemic and inflammatory colitis are less likely considerations.  Plan:  1.  Continue antibiotics. 2.  Full liquid diet. 3.  If continues to improve, would pursue supportive care.  If her symptoms don't improve over the next couple days and her stool studies are negative, would consider sigmoidoscopy versus colonoscopy. 4.  Will follow; case discussed with primary team (Dr. Benjamine Mola).   [name] 01/11/2012, 10:05 AM

## 2012-01-12 DIAGNOSIS — E876 Hypokalemia: Secondary | ICD-10-CM

## 2012-01-12 LAB — BASIC METABOLIC PANEL
CO2: 28 mEq/L (ref 19–32)
Chloride: 107 mEq/L (ref 96–112)
Glucose, Bld: 92 mg/dL (ref 70–99)
Potassium: 3.1 mEq/L — ABNORMAL LOW (ref 3.5–5.1)
Sodium: 141 mEq/L (ref 135–145)

## 2012-01-12 LAB — CBC
Hemoglobin: 9.5 g/dL — ABNORMAL LOW (ref 12.0–15.0)
RBC: 2.96 MIL/uL — ABNORMAL LOW (ref 3.87–5.11)

## 2012-01-12 MED ORDER — POTASSIUM CHLORIDE CRYS ER 20 MEQ PO TBCR
40.0000 meq | EXTENDED_RELEASE_TABLET | Freq: Every day | ORAL | Status: DC
  Administered 2012-01-12 – 2012-01-14 (×3): 40 meq via ORAL
  Filled 2012-01-12 (×4): qty 2

## 2012-01-12 MED ORDER — PANTOPRAZOLE SODIUM 40 MG PO TBEC
40.0000 mg | DELAYED_RELEASE_TABLET | Freq: Every day | ORAL | Status: DC
  Administered 2012-01-12 – 2012-01-14 (×3): 40 mg via ORAL
  Filled 2012-01-12 (×2): qty 1

## 2012-01-12 MED ORDER — POTASSIUM CHLORIDE IN NACL 40-0.9 MEQ/L-% IV SOLN
INTRAVENOUS | Status: DC
  Administered 2012-01-12 – 2012-01-14 (×5): via INTRAVENOUS
  Filled 2012-01-12 (×4): qty 1000

## 2012-01-12 NOTE — Progress Notes (Signed)
[facility] PROGRESS NOTE  [name] ZOX:[id number] DOB: 05/31/1950 DOA: 01/09/2012 PCP: Evette Georges, MD  Assessment/Plan: 1. Colitis with bloody stools- appreciate GI's assistance, cipro/flagyl, c-diff negative, other stool studies negative so far, clear liquids, may need colonoscopy/sigmoidoscopy 2. Pain control- PO PRN medications 3. hypokalmeia- replace oral and in IVF, BMP daily 4. Leukocytosis- monitor with CBC- afebrile- improving 5. Anemia- prob ABLA (bleeding with stools)  Code Status: full Family Communication:  Disposition Plan: home when work up complete   Consultants:  GI  Procedures:    Antibiotics:  Cipro/flagyl  HPI/Subjective:  tolerating food No new c/o +BM   Objective: Filed Vitals:   01/11/12 0517 01/11/12 1447 01/11/12 2230 01/12/12 0615  BP: 133/75 111/68 135/80 131/82  Pulse: 75 70 74 73  Temp: 98.8 F (37.1 C) 98.1 F (36.7 C) 98 F (36.7 C) 98.2 F (36.8 C)  TempSrc: Oral Oral Oral Oral  Resp: 18 18 20 20   Height:      Weight:      SpO2: 95% 94% 95% 94%    Intake/Output Summary (Last 24 hours) at 01/12/12 0813 Last data filed at 01/12/12 0516  Gross per 24 hour  Intake 1829.58 ml  Output    650 ml  Net 1179.58 ml   Filed Weights   01/09/12 1253  Weight: 56.2 kg (123 lb 14.4 oz)    Exam:   General:  A+Ox3, NAD  Cardiovascular: rrr  Respiratory: clear anterior  Abdomen: +Bs, soft, NT.ND  Data Reviewed: Basic Metabolic Panel:  Lab 01/12/12 8119 01/11/12 0835 01/10/12 0710 01/09/12 1736 01/09/12 0842  NA 141 139 140 143 142  K 3.1* 4.1 2.8* 3.0* 3.0*  CL 107 107 104 105 98  CO2 28 25 26 27 27   GLUCOSE 92 110* 116* 123* 155*  BUN <3* 3* 4* 6 11  CREATININE 0.69 0.68 0.59 0.57 0.69  CALCIUM 8.8 8.4 8.3* 8.5 10.4  MG -- -- 2.1 -- --  PHOS -- -- -- -- --   Liver Function Tests:  Lab 01/11/12 0835 01/10/12 0710 01/09/12 0842  AST 17 24 52*  ALT 18 25 47*  ALKPHOS 42 45 63  BILITOT 0.2*  0.4 0.4  PROT 5.7* 6.1 8.4*  ALBUMIN 2.8* 3.0* 4.4    Lab 01/09/12 0842  LIPASE 22  AMYLASE --   No results found for this basename: AMMONIA:5 in the last 168 hours CBC:  Lab 01/12/12 0505 01/11/12 0835 01/10/12 1832 01/10/12 0710 01/09/12 0842  WBC 11.1* 15.3* 17.4* 15.3* 10.3  NEUTROABS -- -- -- 12.2* 8.8*  HGB 9.5* 9.8* 10.6* 11.0* 14.5  HCT 28.9* 29.5* 31.6* 32.4* 42.0  MCV 97.6 99.0 97.8 96.1 95.5  PLT 192 176 196 210 273   Cardiac Enzymes: No results found for this basename: CKTOTAL:5,CKMB:5,CKMBINDEX:5,TROPONINI:5 in the last 168 hours BNP (last 3 results) No results found for this basename: PROBNP:3 in the last 8760 hours CBG: No results found for this basename: GLUCAP:5 in the last 168 hours  Recent Results (from the past 240 hour(s))  CLOSTRIDIUM DIFFICILE BY PCR     Status: Normal   Collection Time   01/09/12  1:50 PM      Component Value Range Status Comment   C difficile by pcr NEGATIVE  NEGATIVE Final   STOOL CULTURE     Status: Normal (Preliminary result)   Collection Time   01/09/12  1:50 PM      Component Value Range Status Comment   Specimen Description STOOL  Final    Special Requests NONE   Final    Culture NO SUSPICIOUS COLONIES, CONTINUING TO HOLD   Final    Report Status PENDING   Incomplete      Studies: No results found.  Scheduled Meds:    . amLODipine  10 mg Oral Daily  . ciprofloxacin  500 mg Oral BID  . hydrochlorothiazide  25 mg Oral Daily  . metroNIDAZOLE  500 mg Oral Q8H  . nadolol  20 mg Oral Daily  . nicotine  14 mg Transdermal Daily  . pantoprazole (PROTONIX) IV  40 mg Intravenous Q24H  . potassium chloride  40 mEq Oral Daily   Continuous Infusions:    . 0.9 % NaCl with KCl 40 mEq / L      Principal Problem:  *Abdominal pain, bilateral lower quadrant Active Problems:  DEPRESSION  HYPERTENSION  Bright red blood per rectum  Hypokalemia  Colitis    Time spent: 25    Marlin Canary  [facility] Pager [phone]. If 8PM-8AM, please contact night-coverage at [email or web address], password [facility] 01/12/2012, 8:13 AM  LOS: 3 days

## 2012-01-12 NOTE — Progress Notes (Signed)
[facility] Progress Note  Subjective: Still complaining of significant left lower quadrant pain but states that rectal bleeding has stopped.  Objective: Vital signs in last 24 hours: Temp:  [98 F (36.7 C)-98.2 F (36.8 C)] 98.2 F (36.8 C) (12/23 0615) Pulse Rate:  [70-74] 73  (12/23 0615) Resp:  [18-20] 20  (12/23 0615) BP: (111-135)/(68-82) 131/82 mmHg (12/23 0615) SpO2:  [94 %-95 %] 94 % (12/23 0615) Weight change:    PE: Moderate left lower plus tenderness  Lab Results: Results for orders placed during the hospital encounter of 01/09/12 (from the past 24 hour(s))  CBC     Status: Abnormal   Collection Time   01/12/12  5:05 AM      Component Value Range   WBC 11.1 (*) 4.0 - 10.5 K/uL   RBC 2.96 (*) 3.87 - 5.11 MIL/uL   Hemoglobin 9.5 (*) 12.0 - 15.0 g/dL   HCT 40.9 (*) 81.1 - 91.4 %   MCV 97.6  78.0 - 100.0 fL   MCH 32.1  26.0 - 34.0 pg   MCHC 32.9  30.0 - 36.0 g/dL   RDW 78.2  95.6 - 21.3 %   Platelets 192  150 - 400 K/uL  BASIC METABOLIC PANEL     Status: Abnormal   Collection Time   01/12/12  5:05 AM      Component Value Range   Sodium 141  135 - 145 mEq/L   Potassium 3.1 (*) 3.5 - 5.1 mEq/L   Chloride 107  96 - 112 mEq/L   CO2 28  19 - 32 mEq/L   Glucose, Bld 92  70 - 99 mg/dL   BUN <3 (*) 6 - 23 mg/dL   Creatinine, Ser 0.86  0.50 - 1.10 mg/dL   Calcium 8.8  8.4 - 57.8 mg/dL   GFR calc non Af Amer >90  >90 mL/min   GFR calc Af Amer >90  >90 mL/min    Studies/Results: No results found.    Assessment: Acute colitis, presumed ischemic versus infectious, C. difficile, stool culture pending Plan: Continue supportive care, will will need colonoscopy eventually, consider sigmoidoscopy relatively earlier if abdominal pain does not improve soon. She states her last colonoscopy was 10 years ago.    Danaya Geddis C 01/12/2012, 9:53 AM

## 2012-01-13 LAB — BASIC METABOLIC PANEL
BUN: 3 mg/dL — ABNORMAL LOW (ref 6–23)
Chloride: 102 mEq/L (ref 96–112)
GFR calc Af Amer: >90 mL/min (ref >90)
Glucose, Bld: 104 mg/dL — ABNORMAL HIGH (ref 70–99)
Potassium: 3.5 mEq/L (ref 3.5–5.1)

## 2012-01-13 LAB — CBC
HCT: 32.2 % — ABNORMAL LOW (ref 36.0–46.0)
Hemoglobin: 10.7 g/dL — ABNORMAL LOW (ref 12.0–15.0)
MCH: 32.3 pg (ref 26.0–34.0)
MCHC: 33.2 g/dL (ref 30.0–36.0)

## 2012-01-13 MED ORDER — SACCHAROMYCES BOULARDII 250 MG PO CAPS
250.0000 mg | ORAL_CAPSULE | Freq: Two times a day (BID) | ORAL | Status: DC
  Administered 2012-01-13 – 2012-01-14 (×3): 250 mg via ORAL
  Filled 2012-01-13 (×5): qty 1

## 2012-01-13 NOTE — Progress Notes (Signed)
[facility] Progress Note  Subjective: Patient still having loose stools with moderate abdominal pain no further visible blood in her stools  Objective: Vital signs in last 24 hours: Temp:  [98.1 F (36.7 C)-98.3 F (36.8 C)] 98.3 F (36.8 C) (12/24 4098) Pulse Rate:  [64-76] 67  (12/24 0608) Resp:  [18-20] 18  (12/24 1191) BP: (110-131)/(61-81) 131/74 mmHg (12/24 0949) SpO2:  [96 %-98 %] 97 % (12/24 4782) Weight change:    PE: Still with diffuse lower and mid abdominal tenderness but less so than yesterday  Lab Results: Results for orders placed during the hospital encounter of 01/09/12 (from the past 24 hour(s))  BASIC METABOLIC PANEL     Status: Abnormal   Collection Time   01/13/12  6:02 AM      Component Value Range   Sodium 141  135 - 145 mEq/L   Potassium 3.5  3.5 - 5.1 mEq/L   Chloride 102  96 - 112 mEq/L   CO2 29  19 - 32 mEq/L   Glucose, Bld 104 (*) 70 - 99 mg/dL   BUN 3 (*) 6 - 23 mg/dL   Creatinine, Ser 9.56  0.50 - 1.10 mg/dL   Calcium 9.4  8.4 - 21.3 mg/dL   GFR calc non Af Amer >90  >90 mL/min   GFR calc Af Amer >90  >90 mL/min  CBC     Status: Abnormal   Collection Time   01/13/12  6:02 AM      Component Value Range   WBC 8.2  4.0 - 10.5 K/uL   RBC 3.31 (*) 3.87 - 5.11 MIL/uL   Hemoglobin 10.7 (*) 12.0 - 15.0 g/dL   HCT 08.6 (*) 57.8 - 46.9 %   MCV 97.3  78.0 - 100.0 fL   MCH 32.3  26.0 - 34.0 pg   MCHC 33.2  30.0 - 36.0 g/dL   RDW 62.9  52.8 - 41.3 %   Platelets 253  150 - 400 K/uL    Studies/Results: No results found.    Assessment: Acute colitis unclear whether ischemic, infectious, or less likely inflammatory bowel disease, seems to be improving on bowel rest Cipro and Flagyl  Plan: Continue IV antibiotics with total antibiotic duration of 2 weeks, changing to by mouth as tolerated advancing diet slowly as tolerated. It has been over 10 years and she's had a colonoscopy since she will need 1 eventually either prior to  discharge or electively as outpatient. Will continue to follow with you.    Adaisha Campise C 01/13/2012, 10:04 AM

## 2012-01-13 NOTE — Progress Notes (Signed)
PATIENT DETAILS Name: [name] Age: 61 y.o. Sex: female Date of Birth: Sep 18, 1950 Admit Date: 01/09/2012 Admitting Physician No admitting provider for patient encounter. [id number] ALLEN, MD  Subjective: Still complaining of lower abdominal pain. Claims that diarrhea and hematochezia a significantly better  Assessment/Plan: Principal Problem: Colitis - Current working diagnosis is that of infectious colitis - Continue with Cipro and Flagyl - Add probiotic - GI following - May need either a sigmoidoscopy or colonoscopy prior to discharge - As needed narcotics and antiemetics  Active Problems: Hypertension - Controlled with amlodipine, hydrochlorothiazide and then abdominal  Hypokalemia - Resolved - Continue with KCl supplementation. - Likely significant GI loss and also from HCTZ  Anemia - Likely secondary to acute blood - Hemoglobin 10.7 today - Monitor H&H periodic  Disposition: Remain inpatient  DVT Prophylaxis: SCD   Code Status: Full code  Procedures:  None  CONSULTS:  GI  PHYSICAL EXAM: Vital signs in last 24 hours: Filed Vitals:   01/12/12 1423 01/12/12 2127 01/13/12 0608 01/13/12 0949  BP: 128/81 125/77 125/80 131/74  Pulse: 67 64 67   Temp: 98.1 F (36.7 C) 98.2 F (36.8 C) 98.3 F (36.8 C)   TempSrc: Oral Oral Oral   Resp: 18 20 18    Height:      Weight:      SpO2: 98% 96% 97%     Weight change:  Body mass index is 24.20 kg/(m^2).   Gen Exam: Awake and alert with clear speech.   Neck: Supple, No JVD.   Chest: B/L Clear.   CVS: S1 S2 Regular, no murmurs.  Abdomen: soft, BS +, mildly tender in the lower abdominal area, non distended.  Extremities: no edema, lower extremities warm to touch. Neurologic: Non Focal.   Skin: No Rash.   Wounds: N/A.    Intake/Output from previous day:  Intake/Output Summary (Last 24 hours) at 01/13/12 1136 Last data filed at 01/13/12 0600  Gross per 24 hour  Intake 1151.67 ml   Output      0 ml  Net 1151.67 ml     LAB RESULTS: CBC  Lab 01/13/12 0602 01/12/12 0505 01/11/12 0835 01/10/12 1832 01/10/12 0710 01/09/12 0842  WBC 8.2 11.1* 15.3* 17.4* 15.3* --  HGB 10.7* 9.5* 9.8* 10.6* 11.0* --  HCT 32.2* 28.9* 29.5* 31.6* 32.4* --  PLT 253 192 176 196 210 --  MCV 97.3 97.6 99.0 97.8 96.1 --  MCH 32.3 32.1 32.9 32.8 32.6 --  MCHC 33.2 32.9 33.2 33.5 34.0 --  RDW 14.4 15.0 15.4 15.2 14.8 --  LYMPHSABS -- -- -- -- 2.0 1.1  MONOABS -- -- -- -- 1.1* 0.3  EOSABS -- -- -- -- 0.1 0.0  BASOSABS -- -- -- -- 0.0 0.0  BANDABS -- -- -- -- -- --    Chemistries   Lab 01/13/12 0602 01/12/12 0505 01/11/12 0835 01/10/12 0710 01/09/12 1736  NA 141 141 139 140 143  K 3.5 3.1* 4.1 2.8* 3.0*  CL 102 107 107 104 105  CO2 29 28 25 26 27   GLUCOSE 104* 92 110* 116* 123*  BUN 3* <3* 3* 4* 6  CREATININE 0.68 0.69 0.68 0.59 0.57  CALCIUM 9.4 8.8 8.4 8.3* 8.5  MG -- -- -- 2.1 --    CBG: No results found for this basename: GLUCAP:5 in the last 168 hours  GFR Estimated Creatinine Clearance: 58.8 ml/min (by C-G formula based on Cr of 0.68).  Coagulation profile  Lab 01/09/12 0842  INR 0.86  PROTIME --    Cardiac Enzymes No results found for this basename: CK:3,CKMB:3,TROPONINI:3,MYOGLOBIN:3 in the last 168 hours  No components found with this basename: POCBNP:3 No results found for this basename: DDIMER:2 in the last 72 hours No results found for this basename: HGBA1C:2 in the last 72 hours No results found for this basename: CHOL:2,HDL:2,LDLCALC:2,TRIG:2,CHOLHDL:2,LDLDIRECT:2 in the last 72 hours No results found for this basename: TSH,T4TOTAL,FREET3,T3FREE,THYROIDAB in the last 72 hours No results found for this basename: VITAMINB12:2,FOLATE:2,FERRITIN:2,TIBC:2,IRON:2,RETICCTPCT:2 in the last 72 hours No results found for this basename: LIPASE:2,AMYLASE:2 in the last 72 hours  Urine Studies No results found for this basename:  UACOL:2,UAPR:2,USPG:2,UPH:2,UTP:2,UGL:2,UKET:2,UBIL:2,UHGB:2,UNIT:2,UROB:2,ULEU:2,UEPI:2,UWBC:2,URBC:2,UBAC:2,CAST:2,CRYS:2,UCOM:2,BILUA:2 in the last 72 hours  MICROBIOLOGY: Recent Results (from the past 240 hour(s))  CLOSTRIDIUM DIFFICILE BY PCR     Status: Normal   Collection Time   01/09/12  1:50 PM      Component Value Range Status Comment   C difficile by pcr NEGATIVE  NEGATIVE Final   STOOL CULTURE     Status: Normal   Collection Time   01/09/12  1:50 PM      Component Value Range Status Comment   Specimen Description STOOL   Final    Special Requests NONE   Final    Culture     Final    Value: NO SALMONELLA, SHIGELLA, CAMPYLOBACTER, YERSINIA, OR E.COLI 0157:H7 ISOLATED   Report Status 01/13/2012 FINAL   Final     RADIOLOGY STUDIES/RESULTS: Ct Abdomen Pelvis W Contrast  01/09/2012  *RADIOLOGY REPORT*  Clinical Data: Abdominal pain.  Nausea, vomiting and diarrhea. Bright red blood in stools.  History of small bowel obstruction October.  Prior hysterectomy.  CT ABDOMEN AND PELVIS WITH CONTRAST  Technique:  Multidetector CT imaging of the abdomen and pelvis was performed following the standard protocol during bolus administration of intravenous contrast.  Contrast: 80mL OMNIPAQUE IOHEXOL 300 MG/ML  SOLN  Comparison: 01/29/2008.  Findings: Inferior lingula 4.9 mm nodule (series 3 image 6).  This appears minimally different than the prior examination (may be related to differences in sectioning plane).  Follow-up 6 -12 months recommended.  Basilar subsegmental atelectatic changes.  Diffuse inflammation/thickening of the transverse colon and left colon.  Findings consistent with colitis, etiology indeterminate with considerations including that secondary to; ischemia, inflammatory process or infectious process.  No free intraperitoneal air.  Small amount of free fluid in the pelvis.  Atherosclerotic type changes of the aorta without aneurysmal dilation.  Common origin and celiac artery  superior mesenteric artery.  Flow is seen within the inferior mesenteric artery. Atherosclerotic type changes common iliac arteries.  No focal hepatic, splenic, pancreatic or adrenal lesion.  No hydronephrosis.  Low density structures within the kidneys too small to adequately characterize possibly cysts.  Degenerative changes lumbar spine most notable L5-S1.  No bony destructive lesion.  Partially contracted noncontrast filled imaging of the urinary bladder without gross abnormality.  Post hysterectomy.  Full gallbladder without calcified gallstone.  IMPRESSION: Prominent colitis transverse colon and left colon.  Please see above  Critical Value/emergent results were called by telephone at the time of interpretation on 01/09/2012 at 12:38 p.m. to [facility] physician's assistant, who verbally acknowledged these results.   Original Report Authenticated By: Lacy Duverney, M.D.    Dg Abd Acute W/chest  01/09/2012  *RADIOLOGY REPORT*  Clinical Data: Nausea, vomiting, diarrhea and rectal bleeding. Abdominal pain.  ACUTE ABDOMEN SERIES (ABDOMEN 2 VIEW & CHEST 1 VIEW)  Comparison: Abdomen one-view 10/30/2011 and chest radiograph 12/01/2008.  Findings: Frontal  view of the chest shows midline trachea and normal heart size.  Lungs are clear.  No pleural fluid.  Two views of the abdomen show a relative paucity of gas in the abdomen.  No gaseous distention of small bowel.  No free air.  IMPRESSION: Paucity of gas in the abdomen can be seen in the setting of vomiting.   Original Report Authenticated By: Leanna Battles, M.D.     MEDICATIONS: Scheduled Meds:   . amLODipine  10 mg Oral Daily  . ciprofloxacin  500 mg Oral BID  . hydrochlorothiazide  25 mg Oral Daily  . metroNIDAZOLE  500 mg Oral Q8H  . nadolol  20 mg Oral Daily  . nicotine  14 mg Transdermal Daily  . pantoprazole  40 mg Oral Q1200  . potassium chloride  40 mEq Oral Daily  . saccharomyces boulardii  250 mg Oral BID   Continuous Infusions:   .  0.9 % NaCl with KCl 40 mEq / L 50 mL/hr at 01/13/12 0513   PRN Meds:.acetaminophen (TYLENOL) oral liquid 160 mg/5 mL, acetaminophen, acetaminophen, LORazepam, ondansetron (ZOFRAN) IV, ondansetron, oxyCODONE, traMADol, zolpidem  Antibiotics: Anti-infectives     Start     Dose/Rate Route Frequency Ordered Stop   01/10/12 1200   ciprofloxacin (CIPRO) tablet 500 mg        500 mg Oral 2 times daily 01/10/12 1035     01/09/12 1400   metroNIDAZOLE (FLAGYL) tablet 500 mg        500 mg Oral 3 times per day 01/09/12 1253     01/09/12 1400   ciprofloxacin (CIPRO) IVPB 400 mg  Status:  Discontinued        400 mg 200 mL/hr over 60 Minutes Intravenous 2 times daily 01/09/12 1253 01/10/12 1035           Jeoffrey Massed, MD  [facility] Pager:[phone]  If 7PM-7AM, please contact night-coverage [email or web address] Password [email or web address] 01/13/2012, 11:36 AM   LOS: 4 days

## 2012-01-14 LAB — CBC
MCH: 32 pg (ref 26.0–34.0)
MCV: 96.6 fL (ref 78.0–100.0)
Platelets: 272 10*3/uL (ref 150–400)
RBC: 3.28 MIL/uL — ABNORMAL LOW (ref 3.87–5.11)

## 2012-01-14 LAB — BASIC METABOLIC PANEL
BUN: 4 mg/dL — ABNORMAL LOW (ref 6–23)
CO2: 30 mEq/L (ref 19–32)
Calcium: 9.3 mg/dL (ref 8.4–10.5)
Creatinine, Ser: 0.8 mg/dL (ref 0.50–1.10)

## 2012-01-14 MED ORDER — SACCHAROMYCES BOULARDII 250 MG PO CAPS: 250.0000 mg | ORAL_CAPSULE | Freq: Two times a day (BID) | ORAL | Status: DC

## 2012-01-14 MED ORDER — OXYCODONE HCL 5 MG PO TABS: 5.0000 mg | ORAL_TABLET | ORAL | Status: DC | PRN

## 2012-01-14 MED ORDER — ONDANSETRON HCL 4 MG PO TABS: 4.0000 mg | ORAL_TABLET | Freq: Four times a day (QID) | ORAL | Status: DC | PRN

## 2012-01-14 MED ORDER — POTASSIUM CHLORIDE CRYS ER 20 MEQ PO TBCR: 20.0000 meq | EXTENDED_RELEASE_TABLET | Freq: Every day | ORAL | Status: DC

## 2012-01-14 NOTE — Progress Notes (Signed)
Nsg Discharge Note  Admit Date:  01/09/2012 Discharge date: 01/14/2012   Marianela Mccarter to be D/C'd Home per MD order.  AVS completed.  Copy for chart, and copy for patient signed, and dated. Patient/caregiver able to verbalize understanding.  Discharge Medication:  Kalana, Yust  Home Medication Instructions [id number]   Printed on:01/14/12 1548  Medication Information                    zolpidem (AMBIEN) 5 MG tablet Take 2.5 mg by mouth at bedtime as needed. For sleep           LORazepam (ATIVAN) 0.5 MG tablet Take 0.5 mg by mouth 3 (three) times daily as needed. For anxiety           nadolol (CORGARD) 20 MG tablet Take 20 mg by mouth daily.           hydrochlorothiazide (HYDRODIURIL) 25 MG tablet Take 25 mg by mouth daily.           amLODipine (NORVASC) 10 MG tablet Take 10 mg by mouth daily.           ondansetron (ZOFRAN) 4 MG tablet Take 1 tablet (4 mg total) by mouth every 6 (six) hours as needed for nausea.           oxyCODONE (OXY IR/ROXICODONE) 5 MG immediate release tablet Take 1 tablet (5 mg total) by mouth every 4 (four) hours as needed.           saccharomyces boulardii (FLORASTOR) 250 MG capsule Take 1 capsule (250 mg total) by mouth 2 (two) times daily.           potassium chloride SA (K-DUR,KLOR-CON) 20 MEQ tablet Take 1 tablet (20 mEq total) by mouth daily.             Discharge Assessment: Filed Vitals:   01/14/12 1400  BP: 116/70  Pulse: 61  Temp: 98 F (36.7 C)  Resp: 14   Skin clean, dry and intact without evidence of skin break down, no evidence of skin tears noted. IV catheter discontinued intact. Site without signs and symptoms of complications - no redness or edema noted at insertion site, patient denies c/o pain - only slight tenderness at site.  Dressing with slight pressure applied.  D/c Instructions-Education: Discharge instructions given to patient/family with verbalized understanding. D/c education completed with  patient/family including follow up instructions, medication list, d/c activities limitations if indicated, with other d/c instructions as indicated by MD - patient able to verbalize understanding, all questions fully answered. Patient instructed to return to ED, call 911, or call MD for any changes in condition.  Patient escorted via WC, and D/C home via private auto.  Camri Molloy, Gretta Cool, RN 01/14/2012 3:48 PM  Nsg Discharge Note  Admit Date:  01/09/2012 Discharge date: 01/14/2012   Germaine Asare to be D/C'd Home per MD order.  AVS completed.  Copy for chart, and copy for patient signed, and dated. Patient/caregiver able to verbalize understanding.  Discharge Medication:  Brehanna, Deveny  Home Medication Instructions [id number]   Printed on:01/14/12 1548  Medication Information                    zolpidem (AMBIEN) 5 MG tablet Take 2.5 mg by mouth at bedtime as needed. For sleep           LORazepam (ATIVAN) 0.5 MG tablet Take 0.5 mg by mouth 3 (three) times daily as needed. For anxiety  nadolol (CORGARD) 20 MG tablet Take 20 mg by mouth daily.           hydrochlorothiazide (HYDRODIURIL) 25 MG tablet Take 25 mg by mouth daily.           amLODipine (NORVASC) 10 MG tablet Take 10 mg by mouth daily.           ondansetron (ZOFRAN) 4 MG tablet Take 1 tablet (4 mg total) by mouth every 6 (six) hours as needed for nausea.           oxyCODONE (OXY IR/ROXICODONE) 5 MG immediate release tablet Take 1 tablet (5 mg total) by mouth every 4 (four) hours as needed.           saccharomyces boulardii (FLORASTOR) 250 MG capsule Take 1 capsule (250 mg total) by mouth 2 (two) times daily.           potassium chloride SA (K-DUR,KLOR-CON) 20 MEQ tablet Take 1 tablet (20 mEq total) by mouth daily.             Discharge Assessment: Filed Vitals:   01/14/12 1400  BP: 116/70  Pulse: 61  Temp: 98 F (36.7 C)  Resp: 14   Skin clean, dry and intact without evidence of skin  break down, no evidence of skin tears noted. IV catheter discontinued intact. Site without signs and symptoms of complications - no redness or edema noted at insertion site, patient denies c/o pain - only slight tenderness at site.  Dressing with slight pressure applied.  D/c Instructions-Education: Discharge instructions given to patient/family with verbalized understanding. D/c education completed with patient/family including follow up instructions, medication list, d/c activities limitations if indicated, with other d/c instructions as indicated by MD - patient able to verbalize understanding, all questions fully answered. Patient instructed to return to ED, call 911, or call MD for any changes in condition.  Patient escorted via WC, and D/C home via private auto.  Breaker Springer Consuella Lose, RN 01/14/2012 3:48 PM
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
## 2012-01-14 NOTE — Progress Notes (Signed)
PATIENT DETAILS Name: [name] Age: 61 y.o. Sex: female Date of Birth: 1950/05/16 Admit Date: 01/09/2012 Admitting Physician No admitting provider for patient encounter. WJX:BJYN,WGNFAOZ ALLEN, MD  Subjective: Feels much better, tolerated a dysphagia 3 diet. No hematochezia still. Abdominal pain now only minimal. With some loose stools-around 3-4 times yesterday. Patient is anxious to go home for Christmas- to be with family  Assessment/Plan: Principal Problem: Colitis - Significantly better, hardly any lower abdominal tenderness - Current working diagnosis is that of infectious colitis - Continue with Cipro and Flagyl- we changed to oral - Continue with probiotic - GI following - Believe colonoscopy can be done as an outpatient, as significantly clinically improved - As needed narcotics and antiemetics  Active Problems: Hypertension - Controlled with amlodipine, hydrochlorothiazide and then abdominal  Hypokalemia - Resolved - Continue with KCl supplementation. - Likely significant GI loss and also from HCTZ  Anemia - Likely secondary to acute blood - Hemoglobin 10.5 today- stable from previous readings - Monitor H&H periodic  Disposition: Remain inpatient- possible discharge later today  DVT Prophylaxis: SCD   Code Status: Full code  Procedures:  None  CONSULTS:  GI  PHYSICAL EXAM: Vital signs in last 24 hours: Filed Vitals:   01/13/12 1449 01/13/12 1604 01/13/12 2150 01/14/12 0509  BP: 124/63 137/72 126/92 122/84  Pulse: 65 112 66 66  Temp: 98.2 F (36.8 C) 99.4 F (37.4 C) 98.1 F (36.7 C) 98.8 F (37.1 C)  TempSrc: Oral Oral Oral Oral  Resp: 18 18 20 18   Height:      Weight:      SpO2: 100% 98% 98% 100%    Weight change:  Body mass index is 24.20 kg/(m^2).   Gen Exam: Awake and alert with clear speech.   Neck: Supple, No JVD.   Chest: B/L Clear.   CVS: S1 S2 Regular, no murmurs.  Abdomen: soft, BS +, hardly any lower abdominal  tenderness (had mild to moderate tenderness yesterday), non distended.  Extremities: no edema, lower extremities warm to touch. Neurologic: Non Focal.   Skin: No Rash.   Wounds: N/A.    Intake/Output from previous day:  Intake/Output Summary (Last 24 hours) at 01/14/12 1232 Last data filed at 01/14/12 0600  Gross per 24 hour  Intake    360 ml  Output      0 ml  Net    360 ml     LAB RESULTS: CBC  Lab 01/14/12 0649 01/13/12 0602 01/12/12 0505 01/11/12 0835 01/10/12 1832 01/10/12 0710 01/09/12 0842  WBC 6.7 8.2 11.1* 15.3* 17.4* -- --  HGB 10.5* 10.7* 9.5* 9.8* 10.6* -- --  HCT 31.7* 32.2* 28.9* 29.5* 31.6* -- --  PLT 272 253 192 176 196 -- --  MCV 96.6 97.3 97.6 99.0 97.8 -- --  MCH 32.0 32.3 32.1 32.9 32.8 -- --  MCHC 33.1 33.2 32.9 33.2 33.5 -- --  RDW 14.3 14.4 15.0 15.4 15.2 -- --  LYMPHSABS -- -- -- -- -- 2.0 1.1  MONOABS -- -- -- -- -- 1.1* 0.3  EOSABS -- -- -- -- -- 0.1 0.0  BASOSABS -- -- -- -- -- 0.0 0.0  BANDABS -- -- -- -- -- -- --    Chemistries   Lab 01/14/12 0649 01/13/12 0602 01/12/12 0505 01/11/12 0835 01/10/12 0710  NA 142 141 141 139 140  K 4.0 3.5 3.1* 4.1 2.8*  CL 102 102 107 107 104  CO2 30 29 28 25 26   GLUCOSE 111* 104*  92 110* 116*  BUN 4* 3* <3* 3* 4*  CREATININE 0.80 0.68 0.69 0.68 0.59  CALCIUM 9.3 9.4 8.8 8.4 8.3*  MG -- -- -- -- 2.1    CBG: No results found for this basename: GLUCAP:5 in the last 168 hours  GFR Estimated Creatinine Clearance: 58.8 ml/min (by C-G formula based on Cr of 0.8).  Coagulation profile  Lab 01/09/12 0842  INR 0.86  PROTIME --    Cardiac Enzymes No results found for this basename: CK:3,CKMB:3,TROPONINI:3,MYOGLOBIN:3 in the last 168 hours  No components found with this basename: POCBNP:3 No results found for this basename: DDIMER:2 in the last 72 hours No results found for this basename: HGBA1C:2 in the last 72 hours No results found for this basename:  CHOL:2,HDL:2,LDLCALC:2,TRIG:2,CHOLHDL:2,LDLDIRECT:2 in the last 72 hours No results found for this basename: TSH,T4TOTAL,FREET3,T3FREE,THYROIDAB in the last 72 hours No results found for this basename: VITAMINB12:2,FOLATE:2,FERRITIN:2,TIBC:2,IRON:2,RETICCTPCT:2 in the last 72 hours No results found for this basename: LIPASE:2,AMYLASE:2 in the last 72 hours  Urine Studies No results found for this basename: UACOL:2,UAPR:2,USPG:2,UPH:2,UTP:2,UGL:2,UKET:2,UBIL:2,UHGB:2,UNIT:2,UROB:2,ULEU:2,UEPI:2,UWBC:2,URBC:2,UBAC:2,CAST:2,CRYS:2,UCOM:2,BILUA:2 in the last 72 hours  MICROBIOLOGY: Recent Results (from the past 240 hour(s))  CLOSTRIDIUM DIFFICILE BY PCR     Status: Normal   Collection Time   01/09/12  1:50 PM      Component Value Range Status Comment   C difficile by pcr NEGATIVE  NEGATIVE Final   STOOL CULTURE     Status: Normal   Collection Time   01/09/12  1:50 PM      Component Value Range Status Comment   Specimen Description STOOL   Final    Special Requests NONE   Final    Culture     Final    Value: NO SALMONELLA, SHIGELLA, CAMPYLOBACTER, YERSINIA, OR E.COLI 0157:H7 ISOLATED   Report Status 01/13/2012 FINAL   Final     RADIOLOGY STUDIES/RESULTS: Ct Abdomen Pelvis W Contrast  01/09/2012  *RADIOLOGY REPORT*  Clinical Data: Abdominal pain.  Nausea, vomiting and diarrhea. Bright red blood in stools.  History of small bowel obstruction October.  Prior hysterectomy.  CT ABDOMEN AND PELVIS WITH CONTRAST  Technique:  Multidetector CT imaging of the abdomen and pelvis was performed following the standard protocol during bolus administration of intravenous contrast.  Contrast: 80mL OMNIPAQUE IOHEXOL 300 MG/ML  SOLN  Comparison: 01/29/2008.  Findings: Inferior lingula 4.9 mm nodule (series 3 image 6).  This appears minimally different than the prior examination (may be related to differences in sectioning plane).  Follow-up 6 -12 months recommended.  Basilar subsegmental atelectatic changes.   Diffuse inflammation/thickening of the transverse colon and left colon.  Findings consistent with colitis, etiology indeterminate with considerations including that secondary to; ischemia, inflammatory process or infectious process.  No free intraperitoneal air.  Small amount of free fluid in the pelvis.  Atherosclerotic type changes of the aorta without aneurysmal dilation.  Common origin and celiac artery superior mesenteric artery.  Flow is seen within the inferior mesenteric artery. Atherosclerotic type changes common iliac arteries.  No focal hepatic, splenic, pancreatic or adrenal lesion.  No hydronephrosis.  Low density structures within the kidneys too small to adequately characterize possibly cysts.  Degenerative changes lumbar spine most notable L5-S1.  No bony destructive lesion.  Partially contracted noncontrast filled imaging of the urinary bladder without gross abnormality.  Post hysterectomy.  Full gallbladder without calcified gallstone.  IMPRESSION: Prominent colitis transverse colon and left colon.  Please see above  Critical Value/emergent results were called by telephone at the  time of interpretation on 01/09/2012 at 12:38 p.m. to [facility] physician's assistant, who verbally acknowledged these results.   Original Report Authenticated By: Lacy Duverney, M.D.    Dg Abd Acute W/chest  01/09/2012  *RADIOLOGY REPORT*  Clinical Data: Nausea, vomiting, diarrhea and rectal bleeding. Abdominal pain.  ACUTE ABDOMEN SERIES (ABDOMEN 2 VIEW & CHEST 1 VIEW)  Comparison: Abdomen one-view 10/30/2011 and chest radiograph 12/01/2008.  Findings: Frontal view of the chest shows midline trachea and normal heart size.  Lungs are clear.  No pleural fluid.  Two views of the abdomen show a relative paucity of gas in the abdomen.  No gaseous distention of small bowel.  No free air.  IMPRESSION: Paucity of gas in the abdomen can be seen in the setting of vomiting.   Original Report Authenticated By: Leanna Battles,  M.D.     MEDICATIONS: Scheduled Meds:    . amLODipine  10 mg Oral Daily  . ciprofloxacin  500 mg Oral BID  . hydrochlorothiazide  25 mg Oral Daily  . metroNIDAZOLE  500 mg Oral Q8H  . nadolol  20 mg Oral Daily  . nicotine  14 mg Transdermal Daily  . pantoprazole  40 mg Oral Q1200  . potassium chloride  40 mEq Oral Daily  . saccharomyces boulardii  250 mg Oral BID   Continuous Infusions:  PRN Meds:.acetaminophen (TYLENOL) oral liquid 160 mg/5 mL, acetaminophen, acetaminophen, LORazepam, ondansetron (ZOFRAN) IV, ondansetron, oxyCODONE, traMADol, zolpidem  Antibiotics: Anti-infectives     Start     Dose/Rate Route Frequency Ordered Stop   01/10/12 1200   ciprofloxacin (CIPRO) tablet 500 mg        500 mg Oral 2 times daily 01/10/12 1035     01/09/12 1400   metroNIDAZOLE (FLAGYL) tablet 500 mg        500 mg Oral 3 times per day 01/09/12 1253     01/09/12 1400   ciprofloxacin (CIPRO) IVPB 400 mg  Status:  Discontinued        400 mg 200 mL/hr over 60 Minutes Intravenous 2 times daily 01/09/12 1253 01/10/12 1035           Jeoffrey Massed, MD  [facility] Pager:[phone]  If 7PM-7AM, please contact night-coverage [email or web address] Password [facility] 01/14/2012, 12:32 PM   LOS: 5 days

## 2012-01-14 NOTE — Progress Notes (Signed)
One loose stool this morning. The patient feels markedly improved. She feels ready to go home. C. difficile and regular stool cultures came back negative.  Impression: Resolving acute colitis, possibly infectious.  Plan: Agree with plans for discharge. I have instructed the patient to contact our office about a week from now to arrange a followup visit with Dr. Dorena Cookey for approximately one to 2 weeks from now.  Florencia Reasons, M.D. [phone]

## 2012-01-15 NOTE — Care Management Note (Signed)
    Page 1 of 1   01/15/2012     11:02:18 AM   CARE MANAGEMENT NOTE 01/15/2012  Patient:  [name], [name]   Account Number:  [id number]  Date Initiated:  01/15/2012  Documentation initiated by:  Letha Cape  Subjective/Objective Assessment:   dx abd pain  admit- lives with family.     Action/Plan:   Anticipated DC Date:  01/14/2012   Anticipated DC Plan:  HOME/SELF CARE      DC Planning Services  CM consult      Choice offered to / List presented to:             Status of service:  Completed, signed off Medicare Important Message given?   (If response is "NO", the following Medicare IM given date fields will be blank) Date Medicare IM given:   Date Additional Medicare IM given:    Discharge Disposition:  HOME/SELF CARE  Per UR Regulation:  Reviewed for med. necessity/level of care/duration of stay  If discussed at Long Length of Stay Meetings, dates discussed:    Comments:  01/15/12 11:01 Letha Cape RN, BSN [phone] patient lives with family, patient dc'd to home.

## 2012-01-22 ENCOUNTER — Other Ambulatory Visit: Payer: Self-pay | Admitting: Family Medicine

## 2012-01-26 ENCOUNTER — Other Ambulatory Visit: Payer: Self-pay | Admitting: Neurological Surgery

## 2012-02-02 ENCOUNTER — Ambulatory Visit: Payer: BC Managed Care – PPO | Admitting: Family Medicine

## 2012-03-04 ENCOUNTER — Ambulatory Visit: Payer: BC Managed Care – PPO | Admitting: Family Medicine

## 2012-03-05 ENCOUNTER — Ambulatory Visit: Payer: BC Managed Care – PPO | Admitting: Family Medicine

## 2012-03-09 ENCOUNTER — Other Ambulatory Visit: Payer: Self-pay | Admitting: Gastroenterology

## 2012-03-09 ENCOUNTER — Other Ambulatory Visit: Payer: Self-pay | Admitting: Family Medicine

## 2012-03-09 DIAGNOSIS — R109 Unspecified abdominal pain: Secondary | ICD-10-CM

## 2012-03-11 ENCOUNTER — Ambulatory Visit
Admission: RE | Admit: 2012-03-11 | Discharge: 2012-03-11 | Disposition: A | Discharge status: Home / Self Care | Payer: BC Managed Care – PPO | Source: Ambulatory Visit | Attending: Gastroenterology | Admitting: Gastroenterology

## 2012-03-11 MED ORDER — IOHEXOL 300 MG/ML  SOLN
100.0000 mL | Freq: Once | INTRAMUSCULAR | Status: AC | PRN
  Administered 2012-03-11: 100 mL via INTRAVENOUS

## 2012-03-12 ENCOUNTER — Other Ambulatory Visit: Payer: Self-pay | Admitting: Family Medicine

## 2012-04-09 ENCOUNTER — Encounter: Payer: Self-pay | Admitting: Family

## 2012-04-09 ENCOUNTER — Ambulatory Visit (INDEPENDENT_AMBULATORY_CARE_PROVIDER_SITE_OTHER): Payer: BC Managed Care – PPO | Admitting: Family

## 2012-04-09 VITALS — BP 140/82 | HR 74 | Temp 98.7°F | Wt 117.0 lb

## 2012-04-09 DIAGNOSIS — F411 Generalized anxiety disorder: Secondary | ICD-10-CM

## 2012-04-09 DIAGNOSIS — F329 Major depressive disorder, single episode, unspecified: Secondary | ICD-10-CM

## 2012-04-09 DIAGNOSIS — F418 Other specified anxiety disorders: Secondary | ICD-10-CM

## 2012-04-09 MED ORDER — ESCITALOPRAM OXALATE 10 MG PO TABS: 10.0000 mg | ORAL_TABLET | Freq: Every day | ORAL | Status: DC

## 2012-04-09 MED ORDER — VALACYCLOVIR HCL 1 G PO TABS: 1000.0000 mg | ORAL_TABLET | Freq: Three times a day (TID) | ORAL | Status: DC

## 2012-04-09 MED ORDER — CLONAZEPAM 0.5 MG PO TABS
0.5000 mg | ORAL_TABLET | Freq: Two times a day (BID) | ORAL | Status: DC | PRN
Start: 2012-04-09 — End: 2012-06-17

## 2012-04-09 NOTE — Patient Instructions (Signed)
Anxiety and Panic Attacks  Your caregiver has informed you that you are having an anxiety or panic attack. There may be many forms of this. Most of the time these attacks come suddenly and without warning. They come at any time of day, including periods of sleep, and at any time of life. They may be strong and unexplained. Although panic attacks are very scary, they are physically harmless. Sometimes the cause of your anxiety is not known. Anxiety is a protective mechanism of the body in its fight or flight mechanism. Most of these perceived danger situations are actually nonphysical situations (such as anxiety over losing a job).  CAUSES    The causes of an anxiety or panic attack are many. Panic attacks may occur in otherwise healthy people given a certain set of circumstances. There may be a genetic cause for panic attacks. Some medications may also have anxiety as a side effect.  SYMPTOMS    Some of the most common feelings are:   Intense terror.   Dizziness, feeling faint.   Hot and cold flashes.   Fear of going crazy.   Feelings that nothing is real.   Sweating.   Shaking.   Chest pain or a fast heartbeat (palpitations).   Smothering, choking sensations.   Feelings of impending doom and that death is near.   Tingling of extremities, this may be from over-breathing.   Altered reality (derealization).   Being detached from yourself (depersonalization).  Several symptoms can be present to make up anxiety or panic attacks.  DIAGNOSIS    The evaluation by your caregiver will depend on the type of symptoms you are experiencing. The diagnosis of anxiety or panic attack is made when no physical illness can be determined to be a cause of the symptoms.  TREATMENT    Treatment to prevent anxiety and panic attacks may include:   Avoidance of circumstances that cause anxiety.   Reassurance and relaxation.   Regular exercise.   Relaxation therapies, such as yoga.    Psychotherapy with a psychiatrist or therapist.   Avoidance of caffeine, alcohol and illegal drugs.   Prescribed medication.  SEEK IMMEDIATE MEDICAL CARE IF:     You experience panic attack symptoms that are different than your usual symptoms.   You have any worsening or concerning symptoms.  Document Released: 01/06/2005 Document Revised: 03/31/2011 Document Reviewed: 05/10/2009  ExitCare Patient Information 2013 [employer].    Depression, Adult  Depression refers to feeling sad, low, down in the dumps, blue, gloomy, or empty. In general, there are two kinds of depression:  1. Depression that we all experience from time to time because of upsetting life experiences, including the loss of a job or the ending of a relationship (normal sadness or normal grief). This kind of depression is considered normal, is short lived, and resolves within a few days to 2 weeks. (Depression experienced after the loss of a loved one is called bereavement. Bereavement often lasts longer than 2 weeks but normally gets better with time.)  2. Clinical depression, which lasts longer than normal sadness or normal grief or interferes with your ability to function at home, at work, and in school. It also interferes with your personal relationships. It affects almost every aspect of your life. Clinical depression is an illness.  Symptoms of depression also can be caused by conditions other than normal sadness and grief or clinical depression. Examples of these conditions are listed as follows:   Physical illness Some physical   illnesses, including underactive thyroid gland (hypothyroidism), severe anemia, specific types of cancer, diabetes, uncontrolled seizures, heart and lung problems, strokes, and chronic pain are commonly associated with symptoms of depression.   Side effects of some prescription medicine In some people, certain types of prescription medicine can cause symptoms of depression.    Substance abuse Abuse of alcohol and illicit drugs can cause symptoms of depression.  SYMPTOMS  Symptoms of normal sadness and normal grief include the following:   Feeling sad or crying for short periods of time.   Not caring about anything (apathy).   Difficulty sleeping or sleeping too much.   No longer able to enjoy the things you used to enjoy.   Desire to be by oneself all the time (social isolation).   Lack of energy or motivation.   Difficulty concentrating or remembering.   Change in appetite or weight.   Restlessness or agitation.  Symptoms of clinical depression include the same symptoms of normal sadness or normal grief and also the following symptoms:   Feeling sad or crying all the time.   Feelings of guilt or worthlessness.   Feelings of hopelessness or helplessness.   Thoughts of suicide or the desire to harm yourself (suicidal ideation).   Loss of touch with reality (psychotic symptoms). Seeing or hearing things that are not real (hallucinations) or having false beliefs about your life or the people around you (delusions and paranoia).  DIAGNOSIS    The diagnosis of clinical depression usually is based on the severity and duration of the symptoms. Your caregiver also will ask you questions about your medical history and substance use to find out if physical illness, use of prescription medicine, or substance abuse is causing your depression. Your caregiver also may order blood tests.  TREATMENT    Typically, normal sadness and normal grief do not require treatment. However, sometimes antidepressant medicine is prescribed for bereavement to ease the depressive symptoms until they resolve.  The treatment for clinical depression depends on the severity of your symptoms but typically includes antidepressant medicine, counseling with a mental health professional, or a combination of both. Your caregiver will help to determine what treatment is best for you.   Depression caused by physical illness usually goes away with appropriate medical treatment of the illness. If prescription medicine is causing depression, talk with your caregiver about stopping the medicine, decreasing the dose, or substituting another medicine.  Depression caused by abuse of alcohol or illicit drugs abuse goes away with abstinence from these substances. Some adults need professional help in order to stop drinking or using drugs.  SEEK IMMEDIATE CARE IF:   You have thoughts about hurting yourself or others.   You lose touch with reality (have psychotic symptoms).   You are taking medicine for depression and have a serious side effect.  FOR MORE INFORMATION  National Alliance on Mental Illness: www.nami.org   National Institute of Mental Health: www.nimh.nih.gov   Document Released: 01/04/2000 Document Revised: 07/08/2011 Document Reviewed: 04/07/2011  ExitCare Patient Information 2013 [employer].
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
## 2012-06-10 ENCOUNTER — Other Ambulatory Visit: Payer: BC Managed Care – PPO

## 2012-06-11 ENCOUNTER — Other Ambulatory Visit (INDEPENDENT_AMBULATORY_CARE_PROVIDER_SITE_OTHER): Payer: BC Managed Care – PPO

## 2012-06-11 DIAGNOSIS — Z0000 Encounter for general adult medical examination without abnormal findings: Secondary | ICD-10-CM

## 2012-06-11 LAB — POCT URINALYSIS DIPSTICK
Bilirubin, UA: NEGATIVE
Ketones, UA: NEGATIVE
Spec Grav, UA: 1.025
pH, UA: 6

## 2012-06-11 LAB — BASIC METABOLIC PANEL
Calcium: 9.5 mg/dL (ref 8.4–10.5)
GFR: 123.72 mL/min (ref >60.00)
Potassium: 3.8 mEq/L (ref 3.5–5.1)
Sodium: 141 mEq/L (ref 135–145)

## 2012-06-11 LAB — LIPID PANEL
Cholesterol: 180 mg/dL (ref 0–200)
HDL: 85.2 mg/dL (ref >39.00)
Triglycerides: 76 mg/dL (ref 0.0–149.0)
VLDL: 15.2 mg/dL (ref 0.0–40.0)

## 2012-06-11 LAB — CBC WITH DIFFERENTIAL/PLATELET
Basophils Absolute: 0 10*3/uL (ref 0.0–0.1)
Eosinophils Absolute: 0.1 10*3/uL (ref 0.0–0.7)
Eosinophils Relative: 1.3 % (ref 0.0–5.0)
MCHC: 34.4 g/dL (ref 30.0–36.0)
MCV: 99.2 fl (ref 78.0–100.0)
Monocytes Absolute: 0.8 10*3/uL (ref 0.1–1.0)
Neutrophils Relative %: 65.6 % (ref 43.0–77.0)
Platelets: 309 10*3/uL (ref 150.0–400.0)
RDW: 14.8 % — ABNORMAL HIGH (ref 11.5–14.6)
WBC: 8.6 10*3/uL (ref 4.5–10.5)

## 2012-06-11 LAB — HEPATIC FUNCTION PANEL
AST: 24 U/L (ref 0–37)
Alkaline Phosphatase: 44 U/L (ref 39–117)
Bilirubin, Direct: 0.1 mg/dL (ref 0.0–0.3)
Total Bilirubin: 0.5 mg/dL (ref 0.3–1.2)

## 2012-06-17 ENCOUNTER — Encounter: Payer: Self-pay | Admitting: Family Medicine

## 2012-06-17 ENCOUNTER — Ambulatory Visit (INDEPENDENT_AMBULATORY_CARE_PROVIDER_SITE_OTHER): Payer: BC Managed Care – PPO | Admitting: Family Medicine

## 2012-06-17 VITALS — BP 110/70 | Temp 98.9°F | Ht 61.0 in | Wt 114.0 lb

## 2012-06-17 DIAGNOSIS — R319 Hematuria, unspecified: Secondary | ICD-10-CM

## 2012-06-17 DIAGNOSIS — I10 Essential (primary) hypertension: Secondary | ICD-10-CM

## 2012-06-17 DIAGNOSIS — K5190 Ulcerative colitis, unspecified, without complications: Secondary | ICD-10-CM

## 2012-06-17 DIAGNOSIS — N390 Urinary tract infection, site not specified: Secondary | ICD-10-CM | POA: Insufficient documentation

## 2012-06-17 DIAGNOSIS — R3129 Other microscopic hematuria: Secondary | ICD-10-CM | POA: Insufficient documentation

## 2012-06-17 DIAGNOSIS — F329 Major depressive disorder, single episode, unspecified: Secondary | ICD-10-CM

## 2012-06-17 DIAGNOSIS — Z720 Tobacco use: Secondary | ICD-10-CM

## 2012-06-17 DIAGNOSIS — F17200 Nicotine dependence, unspecified, uncomplicated: Secondary | ICD-10-CM

## 2012-06-17 DIAGNOSIS — Z01419 Encounter for gynecological examination (general) (routine) without abnormal findings: Secondary | ICD-10-CM

## 2012-06-17 MED ORDER — ESCITALOPRAM OXALATE 10 MG PO TABS: 10.0000 mg | ORAL_TABLET | Freq: Every day | ORAL | Status: DC

## 2012-06-17 MED ORDER — ZOLPIDEM TARTRATE 10 MG PO TABS: ORAL_TABLET | ORAL | Status: DC

## 2012-06-17 MED ORDER — AMLODIPINE BESYLATE 2.5 MG PO TABS: 2.5000 mg | ORAL_TABLET | Freq: Every day | ORAL | Status: DC

## 2012-06-17 MED ORDER — SULFAMETHOXAZOLE-TRIMETHOPRIM 800-160 MG PO TABS: 1.0000 | ORAL_TABLET | Freq: Two times a day (BID) | ORAL | Status: DC

## 2012-06-17 MED ORDER — CLONAZEPAM 0.5 MG PO TABS: 0.5000 mg | ORAL_TABLET | Freq: Two times a day (BID) | ORAL | Status: DC | PRN

## 2012-06-17 MED ORDER — VARENICLINE TARTRATE 1 MG PO TABS: ORAL_TABLET | ORAL | Status: DC

## 2012-06-17 MED ORDER — NADOLOL 20 MG PO TABS: 20.0000 mg | ORAL_TABLET | Freq: Every day | ORAL | Status: DC

## 2012-06-17 NOTE — Progress Notes (Signed)
  Subjective:    Patient ID: [name], female    DOB: 10/27/1950, 62 y.o.   MRN: [id number]  HPI [name] is a 62 year old married female smoker,,,, 7 per day,,,,,,, who comes in today for general physical examination  She has a history of anxiety and depression. Part of which is related to the fact that her son was killed on [address] in the winter a year and a half ago. She takes Klonopin 0.5 mg twice a day, Lexapro 20 mg daily and Corgard 20 mg daily for rapid heart rate.  She takes Norvasc 10 mg daily for hypertension BP actually too low 110/78. With the GI problem she's lost 30 pounds. Current weight 114. We'll cut the Norvasc in half  She takes Ambien 10 mg each bedtime or she can't sleep  She's had 2 admissions last fall for abdominal pain and bloody diarrhea. Etiology was unknown.  She gets routine eye care, dental care, BSE monthly, last mammogram 2011, GI followup by Dr. Madilyn Fireman  Tetanus 2009 seasonal flu shot 2013.    She had her uterus and ovaries removed for nonmalignant reasons   Review of Systems  Constitutional: Negative.   HENT: Negative.   Eyes: Negative.   Respiratory: Negative.   Cardiovascular: Negative.   Gastrointestinal: Negative.   Genitourinary: Negative.   Musculoskeletal: Negative.   Neurological: Negative.   Psychiatric/Behavioral: Negative.        Objective:   Physical Exam  Constitutional: She appears well-developed and well-nourished.  HENT:  Head: Normocephalic and atraumatic.  Right Ear: External ear normal.  Left Ear: External ear normal.  Nose: Nose normal.  Mouth/Throat: Oropharynx is clear and moist.  Eyes: EOM are normal. Pupils are equal, round, and reactive to light.  Neck: Normal range of motion. Neck supple. No thyromegaly present.  Cardiovascular: Normal rate, regular rhythm, normal heart sounds and intact distal pulses.  Exam reveals no gallop and no friction rub.   No murmur heard. Pulmonary/Chest: Effort normal  and breath sounds normal.  Abdominal: Soft. Bowel sounds are normal. She exhibits no distension and no mass. There is no tenderness. There is no rebound.  Genitourinary:  Pelvic exam normal bimanual exam shows no palpable masses cervix is been surgically removed  Bilateral breast exam normal  Musculoskeletal: Normal range of motion.  Lymphadenopathy:    She has no cervical adenopathy.  Neurological: She is alert. She has normal reflexes. No cranial nerve deficit. She exhibits normal muscle tone. Coordination normal.  Skin: Skin is warm and dry.  Total body skin exam normal  Psychiatric: She has a normal mood and affect. Her behavior is normal. Judgment and thought content normal.          Assessment & Plan:  Healthy female  Depression continue Lexapro 10 mg daily  Anxiety continue Klonopin 0.5 twice a day, Corgard 20 daily and Ambien 10 mg each bedtime  Tobacco abuse begin Chantix program  Hematuria,,,,,,,,,,, Septra twice a day for 10 days recheck urine in 2 weeks,,,,,,,, urologic evaluation of hematuria process  Colitis etiology unknown,,,,,, followup by Dr. Madilyn Fireman  Hypertension BP too low since she's lost weight decrease the Norvasc to 5 mg daily

## 2012-06-17 NOTE — Patient Instructions (Signed)
Decrease the Norvasc to 2.5 mg daily  Continue the Corgard 20 mg daily  Continue Lexapro 10 mg at bedtime, Klonopin 0.5 twice a day, and Ambien 10 mg each bedtime  Begin the Chantix 1 mg,,,,,,,, one half tab every morning  Drink lots of water,,,,,,,,,, Septra one twice daily  Return in 2 weeks for followup  We will also get you set up for a nutrition consult

## 2012-07-01 ENCOUNTER — Encounter: Payer: Self-pay | Admitting: Family Medicine

## 2012-07-01 ENCOUNTER — Ambulatory Visit (INDEPENDENT_AMBULATORY_CARE_PROVIDER_SITE_OTHER): Payer: BC Managed Care – PPO | Admitting: Family Medicine

## 2012-07-01 VITALS — BP 120/70 | Temp 98.0°F | Wt 118.0 lb

## 2012-07-01 DIAGNOSIS — Z8719 Personal history of other diseases of the digestive system: Secondary | ICD-10-CM

## 2012-07-01 DIAGNOSIS — I10 Essential (primary) hypertension: Secondary | ICD-10-CM

## 2012-07-01 DIAGNOSIS — Z87448 Personal history of other diseases of urinary system: Secondary | ICD-10-CM

## 2012-07-01 DIAGNOSIS — N390 Urinary tract infection, site not specified: Secondary | ICD-10-CM

## 2012-07-01 LAB — POCT URINALYSIS DIPSTICK
Blood, UA: NEGATIVE
Glucose, UA: NEGATIVE
Protein, UA: NEGATIVE
Spec Grav, UA: 1.01
Urobilinogen, UA: 0.2

## 2012-07-01 NOTE — Patient Instructions (Signed)
We will set you up a consult in GI for further evaluation.  Have a copy of all your studies that were done at Dr. Madilyn Fireman office with you when you go for her first appointment  Continue to tapper

## 2012-07-01 NOTE — Progress Notes (Signed)
  Subjective:    Patient ID: [name], female    DOB: [birth year], 62 y.o.   MRN: [id number]  HPI [name] is a 62 year old married female smoker who,,,,,,, down to 3 cigarettes a day she was intolerant of the Chantix,,,,, who comes in today for followup of hematuria  She had a urinary tract infection with hematuria we started on Septra DS twice a day she comes back today for followup. She's asymptomatic and indeed her urine is normal. She currently is on Norvasc 2.5 mg Corgard 20 mg BP today 120/70  She had a GI evaluation and colonoscopy by [name] at the [facility]. She says she tried 3 times to make followup appointments and every time she did they canceled her appointments. Because of her frustration she would like to see one of our GI people   Review of Systems Review of systems negative    Objective:   Physical Exam Well-developed well-nourished female no acute distress urinalysis normal       Assessment & Plan:  Hematuria with urinary tract infection resolved  Tobacco use continue tapering as outlined  We'll set up GI consult for further evaluation

## 2012-07-15 ENCOUNTER — Other Ambulatory Visit: Payer: Self-pay | Admitting: Family Medicine

## 2012-07-16 ENCOUNTER — Encounter: Payer: Self-pay | Admitting: Gastroenterology

## 2012-07-16 ENCOUNTER — Telehealth: Payer: Self-pay | Admitting: Gastroenterology

## 2012-07-16 NOTE — Telephone Encounter (Signed)
Patient is an established patient with [facility].  She has recent admission for SBO and colitis and had follow up scheduled with Dr. Madilyn Fireman for 12/2011.  I have left a message for her to call back

## 2012-07-19 NOTE — Telephone Encounter (Signed)
No return call from the patient 

## 2012-07-20 ENCOUNTER — Encounter: Payer: Self-pay | Admitting: Gastroenterology

## 2012-07-28 ENCOUNTER — Telehealth: Payer: Self-pay | Admitting: *Deleted

## 2012-07-28 NOTE — Telephone Encounter (Signed)
Patient is calling because she has on going abdominal pain.  She has not seen blood in her stool.  She doesn't have an appointment with the specialist until 08/05/12.  She would like to know if something can be called in?

## 2012-07-29 NOTE — Telephone Encounter (Signed)
Spoke with GI and patient will need to get records from [facility] and have them reviewed before being seen.  Left message on machine for patient. Per Dr Tawanna Cooler patient should be seen by [facility] or [facility]

## 2012-07-29 NOTE — Telephone Encounter (Signed)
Patient is aware and will call [facility]

## 2012-08-02 ENCOUNTER — Other Ambulatory Visit: Payer: Self-pay | Admitting: Family Medicine

## 2012-08-05 ENCOUNTER — Ambulatory Visit: Payer: BC Managed Care – PPO | Admitting: Gastroenterology

## 2012-08-10 ENCOUNTER — Ambulatory Visit: Payer: BC Managed Care – PPO | Admitting: Dietician

## 2012-08-25 ENCOUNTER — Ambulatory Visit: Payer: BC Managed Care – PPO | Admitting: Dietician

## 2012-09-25 ENCOUNTER — Other Ambulatory Visit: Payer: Self-pay | Admitting: Family Medicine

## 2012-10-18 ENCOUNTER — Other Ambulatory Visit: Payer: Self-pay | Admitting: Family Medicine

## 2012-11-18 ENCOUNTER — Other Ambulatory Visit: Payer: Self-pay | Admitting: Family Medicine

## 2012-12-21 ENCOUNTER — Other Ambulatory Visit: Payer: Self-pay | Admitting: Family Medicine

## 2013-01-04 ENCOUNTER — Encounter: Payer: Self-pay | Admitting: *Deleted

## 2013-01-05 ENCOUNTER — Ambulatory Visit (INDEPENDENT_AMBULATORY_CARE_PROVIDER_SITE_OTHER): Payer: BC Managed Care – PPO | Admitting: Family Medicine

## 2013-01-05 ENCOUNTER — Encounter: Payer: Self-pay | Admitting: Family Medicine

## 2013-01-05 VITALS — BP 190/100 | Temp 98.4°F | Wt 113.0 lb

## 2013-01-05 DIAGNOSIS — F329 Major depressive disorder, single episode, unspecified: Secondary | ICD-10-CM

## 2013-01-05 DIAGNOSIS — I10 Essential (primary) hypertension: Secondary | ICD-10-CM

## 2013-01-05 MED ORDER — NADOLOL 20 MG PO TABS: 20.0000 mg | ORAL_TABLET | Freq: Every day | ORAL | Status: DC

## 2013-01-05 MED ORDER — LORAZEPAM 0.5 MG PO TABS: ORAL_TABLET | ORAL | Status: DC

## 2013-01-05 MED ORDER — ESCITALOPRAM OXALATE 10 MG PO TABS: 10.0000 mg | ORAL_TABLET | Freq: Every day | ORAL | Status: DC
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
## 2013-01-05 NOTE — Patient Instructions (Signed)
Norvasc 10 mg........ Corgard 20 mg......... one of each daily in the morning for high blood pressure  Lexapro.........Marland Kitchen 1 at bedtime  . Ativan,,,,,,,,,,,, one tablet 3 times daily  Call and make an appointment to see [name] ASAP  If however you feel so anxious to your medication in the is not helping or you feel suicidal call and go to the behavior health center for immediate evaluation  Return in 2 weeks for followup with me

## 2013-01-06 ENCOUNTER — Telehealth: Payer: Self-pay | Admitting: Family Medicine

## 2013-01-06 NOTE — Telephone Encounter (Signed)
Patient went to the pharmacy and was able to pick up her Ativan and Norvasc, however, Corgard was not available.  [facility] [address] per pt request, per Epic instructions : Corgard 20mg  PO 1x daily, #100 tabs, 3 refills.   Spoke with Anna/Pharmacist and gave patient a pickup time of 14:00 01/06/2013.   Patient requests  Refill on her Klonopin as well.  She states that she has taken Ativan and Klonopin together and she has run out.  Please call in to [facility] [address] or notify her otherwise.  Thanks.

## 2013-01-07 NOTE — Telephone Encounter (Signed)
Left detailed message on machine for patient that she should not take ativan with klonopin per Dr Tawanna Cooler

## 2013-01-19 ENCOUNTER — Ambulatory Visit: Payer: BC Managed Care – PPO | Admitting: Family Medicine

## 2013-01-31 ENCOUNTER — Ambulatory Visit: Payer: BC Managed Care – PPO | Admitting: Family Medicine

## 2013-02-01 ENCOUNTER — Other Ambulatory Visit: Payer: Self-pay | Admitting: Family Medicine

## 2013-02-03 ENCOUNTER — Ambulatory Visit: Payer: BC Managed Care – PPO | Admitting: Family Medicine

## 2013-04-15 ENCOUNTER — Telehealth: Payer: Self-pay | Admitting: Family Medicine

## 2013-04-15 NOTE — Telephone Encounter (Signed)
Pt would like you to call concerning some issues pt has. Before she make appt, pt would like to discuss w/ rachel  Pt has memory loss issues and anxiety.

## 2013-04-15 NOTE — Telephone Encounter (Signed)
Spoke with patient.  She plans on moving to [place] with her daughter but would like to keep Dr Sherren Mocha as here PCP.  She will call back for an appointment.

## 2013-05-09 ENCOUNTER — Ambulatory Visit (INDEPENDENT_AMBULATORY_CARE_PROVIDER_SITE_OTHER): Payer: BC Managed Care – PPO | Admitting: Family Medicine

## 2013-05-09 ENCOUNTER — Encounter: Payer: Self-pay | Admitting: Family Medicine

## 2013-05-09 VITALS — BP 140/90 | Temp 97.9°F | Wt 120.0 lb

## 2013-05-09 DIAGNOSIS — F410 Panic disorder [episodic paroxysmal anxiety] without agoraphobia: Secondary | ICD-10-CM

## 2013-05-09 DIAGNOSIS — F329 Major depressive disorder, single episode, unspecified: Secondary | ICD-10-CM

## 2013-05-09 DIAGNOSIS — I10 Essential (primary) hypertension: Secondary | ICD-10-CM

## 2013-05-09 DIAGNOSIS — F3289 Other specified depressive episodes: Secondary | ICD-10-CM

## 2013-05-09 MED ORDER — LORAZEPAM 0.5 MG PO TABS
ORAL_TABLET | ORAL | Status: DC
Start: 2013-05-09 — End: 2013-10-31

## 2013-05-09 MED ORDER — TRAZODONE HCL 50 MG PO TABS: ORAL_TABLET | ORAL | Status: DC

## 2013-05-09 MED ORDER — AMLODIPINE BESYLATE 2.5 MG PO TABS: 2.5000 mg | ORAL_TABLET | Freq: Every day | ORAL | Status: DC

## 2013-05-09 NOTE — Patient Instructions (Signed)
Norvasc 2.5 mg............. one tablet in the morning for high blood pressure  Check your blood pressure daily in the morning for 6 weeks  Blood pressure goal 135/85 or less. If not at goal call and leave a voicemail for Apolonio Schneiders and we will change her medication  Ativan 0.5......... one tablet 4 times daily. Continue your exercise program  Try to decrease her Ativan
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
## 2013-05-10 ENCOUNTER — Telehealth: Payer: Self-pay | Admitting: Family Medicine

## 2013-05-10 NOTE — Telephone Encounter (Signed)
Relevant patient education mailed to patient.  

## 2013-08-29 ENCOUNTER — Telehealth: Payer: Self-pay | Admitting: Family Medicine

## 2013-08-29 NOTE — Telephone Encounter (Signed)
Pt would like to talk to you about some signs and symptoms before she makes an appointment.

## 2013-08-30 NOTE — Telephone Encounter (Signed)
Spoke with patient and she thinks she has pulled a muscle on her right side.  Patient will call back for an appointment or go to ortho closer to where she now lives.

## 2013-09-30 ENCOUNTER — Telehealth: Payer: Self-pay | Admitting: Family Medicine

## 2013-09-30 NOTE — Telephone Encounter (Signed)
error 

## 2013-10-03 ENCOUNTER — Ambulatory Visit (INDEPENDENT_AMBULATORY_CARE_PROVIDER_SITE_OTHER): Payer: BC Managed Care – PPO | Admitting: Family Medicine

## 2013-10-03 ENCOUNTER — Encounter: Payer: Self-pay | Admitting: Family Medicine

## 2013-10-03 VITALS — BP 130/80 | Temp 98.1°F | Wt 130.9 lb

## 2013-10-03 DIAGNOSIS — I10 Essential (primary) hypertension: Secondary | ICD-10-CM

## 2013-10-03 DIAGNOSIS — M542 Cervicalgia: Secondary | ICD-10-CM

## 2013-10-03 DIAGNOSIS — F410 Panic disorder [episodic paroxysmal anxiety] without agoraphobia: Secondary | ICD-10-CM

## 2013-10-03 MED ORDER — AMLODIPINE BESYLATE 2.5 MG PO TABS: 2.5000 mg | ORAL_TABLET | Freq: Every day | ORAL | Status: DC

## 2013-10-03 NOTE — Progress Notes (Signed)
   Subjective:    Patient ID: [name], female    DOB: 1950-09-28, 63 y.o.   MRN: [id number]  HPI [name] is a 63 year old female separated from her husband nonsmoker who comes in today for evaluation of 3 problems  3 weeks ago her blood pressure went up she therefore increased her Norvasc from 2.5-5 mg daily 6 BP now 130/80  She's been on Ativan one tablet 0.5 mg 4 times daily when necessary for anxiety however she says her anxiety is worse and also her depression is worse unresponsive to the trazodone 50 mg at bedtime  She's also having sure with her neck and numbness in her left arm. She's had cervical disc problems and has been treated by neurosurgery Dr. Ellene Route   Review of Systems    review of systems otherwise negative Objective:   Physical Exam  Well-developed well-nourished female no acute distress vital signs stable he is afebrile BP today 130/80 she is oriented x3 alert awake      Assessment & Plan:  Hypertension at goal.............. continue Norvasc 5 mg daily  .Marland Kitchen Cervical disc disease...........Marland Kitchen reconsult with Dr. Clarice [facility] group....  Anxiety/depression/sleep dysfunction........... consult with Dr. Arvil Persons group.Marland Kitchen

## 2013-10-03 NOTE — Patient Instructions (Signed)
Call Dr. Sim Boast [facility]'s group phone number is [phone]  Norvasc 5 mg.........Marland Kitchen 1 daily  If the numbness in your arm persists I would consult with your neurosurgeon

## 2013-10-27 ENCOUNTER — Telehealth: Payer: Self-pay | Admitting: Family Medicine

## 2013-10-27 DIAGNOSIS — I10 Essential (primary) hypertension: Secondary | ICD-10-CM

## 2013-10-27 DIAGNOSIS — F411 Generalized anxiety disorder: Secondary | ICD-10-CM

## 2013-10-27 NOTE — Telephone Encounter (Signed)
Pt would like a new rx amlodipine 5 mg #30 w/refills and lorazepam #120 [facility]. Please call pt once rxs has been call or sent to [facility]

## 2013-10-31 MED ORDER — AMLODIPINE BESYLATE 2.5 MG PO TABS: 2.5000 mg | ORAL_TABLET | Freq: Every day | ORAL | Status: DC

## 2013-10-31 MED ORDER — LORAZEPAM 0.5 MG PO TABS: ORAL_TABLET | ORAL | Status: DC

## 2013-10-31 NOTE — Telephone Encounter (Signed)
Ok per Dr Sherren Mocha but pt needs to see Dr Letta Moynahan.  Pt aware she stated that she will call them today and give them her credit card information and schedule appt.  Lorazepam and amlodipine called in to [facility]

## 2013-11-03 ENCOUNTER — Telehealth: Payer: Self-pay | Admitting: Family Medicine

## 2013-11-03 NOTE — Telephone Encounter (Signed)
Spoke with pharmacist and he will get it ready.

## 2013-11-03 NOTE — Telephone Encounter (Signed)
Pt request refill amLODipine (NORVASC) 2.5 MG tablet Pt states [facility] and she has been out [facility]

## 2013-11-04 ENCOUNTER — Telehealth: Payer: Self-pay | Admitting: Family Medicine

## 2013-11-04 MED ORDER — AMLODIPINE BESYLATE 5 MG PO TABS: 5.0000 mg | ORAL_TABLET | Freq: Every day | ORAL | Status: DC

## 2013-11-04 NOTE — Telephone Encounter (Signed)
Pt said she need a new rx for amLODipine (NORVASC) 2.5 MG tablet she said Dr Sherren Mocha up her mg to 5    Pharmacy; [place]

## 2013-11-21 ENCOUNTER — Encounter: Payer: Self-pay | Admitting: Family Medicine

## 2014-02-20 ENCOUNTER — Other Ambulatory Visit: Payer: Self-pay | Admitting: Family Medicine

## 2014-03-07 ENCOUNTER — Telehealth: Payer: Self-pay

## 2014-03-07 MED ORDER — AMLODIPINE BESYLATE 5 MG PO TABS: 5.0000 mg | ORAL_TABLET | Freq: Every day | ORAL | Status: DC

## 2014-03-07 NOTE — Telephone Encounter (Signed)
Rx sent 

## 2014-03-07 NOTE — Telephone Encounter (Signed)
[facility] request for AMLODIPINE 5MG  TAB

## 2014-05-18 ENCOUNTER — Other Ambulatory Visit: Payer: Self-pay | Admitting: Family Medicine

## 2014-06-02 ENCOUNTER — Telehealth: Payer: Self-pay | Admitting: *Deleted

## 2014-06-02 NOTE — Telephone Encounter (Signed)
Left message on machine for patient to call back with mammogram result

## 2014-08-07 ENCOUNTER — Other Ambulatory Visit: Payer: Self-pay | Admitting: Family Medicine

## 2014-11-02 ENCOUNTER — Other Ambulatory Visit: Payer: Self-pay | Admitting: Family Medicine

## 2014-11-26 ENCOUNTER — Other Ambulatory Visit: Payer: Self-pay | Admitting: Family Medicine

## 2014-12-06 ENCOUNTER — Ambulatory Visit (INDEPENDENT_AMBULATORY_CARE_PROVIDER_SITE_OTHER): Payer: BLUE CROSS/BLUE SHIELD | Admitting: Family Medicine

## 2014-12-06 DIAGNOSIS — Z23 Encounter for immunization: Secondary | ICD-10-CM

## 2014-12-06 DIAGNOSIS — E049 Nontoxic goiter, unspecified: Secondary | ICD-10-CM

## 2014-12-06 DIAGNOSIS — E042 Nontoxic multinodular goiter: Secondary | ICD-10-CM | POA: Diagnosis not present

## 2014-12-06 LAB — T4, FREE: FREE T4: 0.57 ng/dL — AB (ref 0.60–1.60)

## 2014-12-06 LAB — T3, FREE: T3, Free: 2.5 pg/mL (ref 2.3–4.2)

## 2014-12-06 LAB — TSH: TSH: 4.87 u[IU]/mL — AB (ref 0.35–4.50)

## 2014-12-06 MED ORDER — TRAZODONE HCL 50 MG PO TABS: ORAL_TABLET | ORAL | Status: DC

## 2014-12-06 NOTE — Progress Notes (Signed)
   Subjective:    Patient ID: [name], female    DOB: October 17, 1950, 64 y.o.   MRN: [id number]  HPI [name] is a 64 year old married female....... states he doesn't smoke but she smells of smoke...Marland KitchenMarland KitchenMarland Kitchen who comes in today for evaluation of a lump in her neck  She's notices for about 3 weeks. It's on the right side of her neck. She tells me she's had a history of thyroid disease and one time was on a thyroid supplement but was taken off and her thyroid level stayed normal for many years.  She is otherwise asymptomatic   Review of Systems Review of systems negative    Objective:   Physical Exam Well-developed well-nourished female no acute distress vital signs stable she's afebrile  Examination neck shows a markedly enlarged thyroid gland more prominent on the right than the left extremely nodular       Assessment & Plan:  Nodular goiter......Marland Kitchen Labs............ ultrasound follow-up after studies.Marland KitchenMarland Kitchen

## 2014-12-06 NOTE — Patient Instructions (Signed)
Labs today  We will get you set up for a scan of your thyroid gland ASAP  A week after your thyroid study ......... see me for follow-up

## 2014-12-06 NOTE — Progress Notes (Signed)
Pre visit review using our clinic review tool, if applicable. No additional management support is needed unless otherwise documented below in the visit note. 

## 2014-12-19 ENCOUNTER — Encounter (HOSPITAL_COMMUNITY)
Admission: RE | Admit: 2014-12-19 | Discharge: 2014-12-19 | Disposition: A | Discharge status: Home / Self Care | Payer: BLUE CROSS/BLUE SHIELD | Source: Ambulatory Visit | Attending: Family Medicine | Admitting: Family Medicine

## 2014-12-19 ENCOUNTER — Encounter (HOSPITAL_COMMUNITY): Payer: BLUE CROSS/BLUE SHIELD

## 2014-12-19 DIAGNOSIS — E042 Nontoxic multinodular goiter: Secondary | ICD-10-CM | POA: Insufficient documentation

## 2014-12-19 DIAGNOSIS — E049 Nontoxic goiter, unspecified: Secondary | ICD-10-CM

## 2014-12-19 MED ORDER — SODIUM IODIDE I 131 CAPSULE
7.5500 | Freq: Once | INTRAVENOUS | Status: AC | PRN
  Administered 2014-12-19: 7.55 via ORAL

## 2014-12-20 ENCOUNTER — Encounter (HOSPITAL_COMMUNITY)
Admission: RE | Admit: 2014-12-20 | Discharge: 2014-12-20 | Disposition: A | Discharge status: Home / Self Care | Payer: BLUE CROSS/BLUE SHIELD | Source: Ambulatory Visit | Attending: Family Medicine | Admitting: Family Medicine

## 2014-12-20 DIAGNOSIS — E042 Nontoxic multinodular goiter: Secondary | ICD-10-CM | POA: Diagnosis not present

## 2014-12-20 MED ORDER — SODIUM PERTECHNETATE TC 99M INJECTION
10.5000 | Freq: Once | INTRAVENOUS | Status: AC | PRN
  Administered 2014-12-20: 11 via INTRAVENOUS

## 2015-01-01 ENCOUNTER — Telehealth: Payer: Self-pay | Admitting: Family Medicine

## 2015-01-01 DIAGNOSIS — E039 Hypothyroidism, unspecified: Secondary | ICD-10-CM

## 2015-01-01 NOTE — Telephone Encounter (Signed)
[name] called saying she had an Xray of her Thyroid and she's wondering if she can get a phone call with her results. Please give her a call.  Pt's ph# [phone] Thank you.

## 2015-01-01 NOTE — Telephone Encounter (Signed)
Normal per Dr Sherren Mocha.  Patient should follow up in 6 months with a TSH.  Patient aware, lab ordered, appointment made.

## 2015-03-02 ENCOUNTER — Other Ambulatory Visit: Payer: Self-pay | Admitting: Family Medicine

## 2015-03-23 ENCOUNTER — Ambulatory Visit (INDEPENDENT_AMBULATORY_CARE_PROVIDER_SITE_OTHER): Payer: BLUE CROSS/BLUE SHIELD | Admitting: Family Medicine

## 2015-03-23 VITALS — BP 160/100 | HR 110 | Temp 98.3°F | Ht 61.0 in | Wt 130.9 lb

## 2015-03-23 DIAGNOSIS — S5011XA Contusion of right forearm, initial encounter: Secondary | ICD-10-CM

## 2015-03-23 NOTE — Patient Instructions (Signed)
Hematoma  A hematoma is a collection of blood under the skin, in an organ, in a body space, in a joint space, or in other tissue. The blood can clot to form a lump that you can see and feel. The lump is often firm and may sometimes become sore and tender. Most hematomas get better in a few days to weeks. However, some hematomas may be serious and require medical care. Hematomas can range in size from very small to very large.  CAUSES   A hematoma can be caused by a blunt or penetrating injury. It can also be caused by spontaneous leakage from a blood vessel under the skin. Spontaneous leakage from a blood vessel is more likely to occur in older people, especially those taking blood thinners. Sometimes, a hematoma can develop after certain medical procedures.  SIGNS AND SYMPTOMS   · A firm lump on the body.  · Possible pain and tenderness in the area.  · Bruising. Blue, dark blue, purple-red, or yellowish skin may appear at the site of the hematoma if the hematoma is close to the surface of the skin.  For hematomas in deeper tissues or body spaces, the signs and symptoms may be subtle. For example, an intra-abdominal hematoma may cause abdominal pain, weakness, fainting, and shortness of breath. An intracranial hematoma may cause a headache or symptoms such as weakness, trouble speaking, or a change in consciousness.  DIAGNOSIS   A hematoma can usually be diagnosed based on your medical history and a physical exam. Imaging tests may be needed if your health care provider suspects a hematoma in deeper tissues or body spaces, such as the abdomen, head, or chest. These tests may include ultrasonography or a CT scan.   TREATMENT   Hematomas usually go away on their own over time. Rarely does the blood need to be drained out of the body. Large hematomas or those that may affect vital organs will sometimes need surgical drainage or monitoring.  HOME CARE INSTRUCTIONS   · Apply ice to the injured area:      Put ice in a  plastic bag.      Place a towel between your skin and the bag.      Leave the ice on for 20 minutes, 2-3 times a day for the first 1 to 2 days.    · After the first 2 days, switch to using warm compresses on the hematoma.    · Elevate the injured area to help decrease pain and swelling. Wrapping the area with an elastic bandage may also be helpful. Compression helps to reduce swelling and promotes shrinking of the hematoma. Make sure the bandage is not wrapped too tight.    · If your hematoma is on a lower extremity and is painful, crutches may be helpful for a couple days.    · Only take over-the-counter or prescription medicines as directed by your health care provider.  SEEK IMMEDIATE MEDICAL CARE IF:   · You have increasing pain, or your pain is not controlled with medicine.    · You have a fever.    · You have worsening swelling or discoloration.    · Your skin over the hematoma breaks or starts bleeding.    · Your hematoma is in your chest or abdomen and you have weakness, shortness of breath, or a change in consciousness.  · Your hematoma is on your scalp (caused by a fall or injury) and you have a worsening headache or a change in alertness or consciousness.  MAKE SURE YOU:   ·   Understand these instructions.  · Will watch your condition.  · Will get help right away if you are not doing well or get worse.     This information is not intended to replace advice given to you by your health care provider. Make sure you discuss any questions you have with your health care provider.     Document Released: 08/21/2003 Document Revised: 09/08/2012 Document Reviewed: 06/16/2012  Elsevier Interactive Patient Education ©2016 [employer]

## 2015-03-23 NOTE — Progress Notes (Signed)
Pre visit review using our clinic review tool, if applicable. No additional management support is needed unless otherwise documented below in the visit note. 
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
## 2015-05-28 ENCOUNTER — Encounter: Payer: Self-pay | Admitting: Gastroenterology

## 2015-05-29 ENCOUNTER — Telehealth: Payer: Self-pay | Admitting: Family Medicine

## 2015-05-29 DIAGNOSIS — N644 Mastodynia: Secondary | ICD-10-CM

## 2015-05-29 NOTE — Telephone Encounter (Signed)
Pt req a referral to [place] she said her right breast is very sore and has a knot

## 2015-05-29 NOTE — Telephone Encounter (Signed)
Referral placed.

## 2015-05-31 ENCOUNTER — Other Ambulatory Visit: Payer: Self-pay | Admitting: Family Medicine

## 2015-05-31 DIAGNOSIS — N644 Mastodynia: Secondary | ICD-10-CM

## 2015-06-01 ENCOUNTER — Other Ambulatory Visit: Payer: BLUE CROSS/BLUE SHIELD

## 2015-06-07 ENCOUNTER — Other Ambulatory Visit: Payer: BLUE CROSS/BLUE SHIELD

## 2015-06-11 ENCOUNTER — Other Ambulatory Visit (INDEPENDENT_AMBULATORY_CARE_PROVIDER_SITE_OTHER): Payer: BLUE CROSS/BLUE SHIELD

## 2015-06-11 DIAGNOSIS — E039 Hypothyroidism, unspecified: Secondary | ICD-10-CM

## 2015-06-11 LAB — TSH: TSH: 3.07 u[IU]/mL (ref 0.35–4.50)

## 2015-06-12 ENCOUNTER — Ambulatory Visit
Admission: RE | Admit: 2015-06-12 | Discharge: 2015-06-12 | Disposition: A | Discharge status: Home / Self Care | Payer: BLUE CROSS/BLUE SHIELD | Source: Ambulatory Visit | Attending: Family Medicine | Admitting: Family Medicine

## 2015-06-12 ENCOUNTER — Other Ambulatory Visit: Payer: Self-pay | Admitting: Family Medicine

## 2015-06-12 DIAGNOSIS — N644 Mastodynia: Secondary | ICD-10-CM

## 2015-06-12 DIAGNOSIS — N6320 Unspecified lump in the left breast, unspecified quadrant: Secondary | ICD-10-CM

## 2015-11-07 ENCOUNTER — Ambulatory Visit: Payer: BLUE CROSS/BLUE SHIELD | Admitting: Family Medicine

## 2015-11-19 ENCOUNTER — Ambulatory Visit (INDEPENDENT_AMBULATORY_CARE_PROVIDER_SITE_OTHER): Payer: BLUE CROSS/BLUE SHIELD | Admitting: Family Medicine

## 2015-11-19 ENCOUNTER — Encounter: Payer: Self-pay | Admitting: Family Medicine

## 2015-11-19 VITALS — BP 172/96 | HR 120 | Temp 98.3°F | Wt 122.5 lb

## 2015-11-19 DIAGNOSIS — I10 Essential (primary) hypertension: Secondary | ICD-10-CM

## 2015-11-19 DIAGNOSIS — M542 Cervicalgia: Secondary | ICD-10-CM | POA: Diagnosis not present

## 2015-11-19 DIAGNOSIS — Z23 Encounter for immunization: Secondary | ICD-10-CM

## 2015-11-19 DIAGNOSIS — F339 Major depressive disorder, recurrent, unspecified: Secondary | ICD-10-CM

## 2015-11-19 MED ORDER — AMLODIPINE BESYLATE 10 MG PO TABS: 10.0000 mg | ORAL_TABLET | Freq: Every day | ORAL | 3 refills | Status: DC

## 2015-11-19 MED ORDER — HYDROCHLOROTHIAZIDE 12.5 MG PO TABS
12.5000 mg | ORAL_TABLET | Freq: Every day | ORAL | 3 refills | Status: DC
Start: 2015-11-19 — End: 2016-11-05

## 2015-11-19 NOTE — Patient Instructions (Signed)
Increase the Norvasc to 10 mg daily in the morning  At hydrochlorothiazide 12.5 mg daily in the morning  Check your blood pressure daily in the morning  Return in one month for follow-up on your blood pressure.......Marland Kitchen bring all your blood pressure readings and the device with your appointment  Call Dr. Tomasita [facility] [phone]  For your neck pain continue to use Motrin and rest

## 2015-11-19 NOTE — Progress Notes (Signed)
Pre visit review using our clinic review tool, if applicable. No additional management support is needed unless otherwise documented below in the visit note. 

## 2015-11-19 NOTE — Progress Notes (Signed)
[name] 65 year old female smoker who comes in today for evaluation of hypertension and headache and depression  She states for the past 7 months she's had pain at the base of her left skull. She says takes Motrin and Tylenol goes lies down and goes away. She has no neurologic symptoms. The pain comes and goes. Worse now than was 7 months ago. No history of trauma  She's been on Norvasc 5 mg daily for hypertension past 6 months her blood pressures been going up. It's been in the 170/90 range at home. Today years 172/96. Repeat by me same right arm sitting position  Last year we recommended because of her history of depression that she see Dr. Caprice Beaver in group. She hasn't done that. She stopped her trazodone. Recommend she go back and see Dr. Caprice Beaver  Physical exam vital signs stable she's afebrile except for BP as noted above  Examination neck is normal except for some palpable tenderness in the paraspinal muscle at the base of skull. Neurologic exam normal  BP right arm sitting position 170/95  Impression #1 hypertension not at goal...... increase Norvasc 10 mg daily add low-dose diuretic  #2 neck pain.......Marland Kitchen most likely muscular.....Marland Kitchen can treat symptomatically with Motrin and rest  #3 depression......Marland Kitchen refer to Dr. Caprice Beaver in group

## 2015-12-06 ENCOUNTER — Other Ambulatory Visit: Payer: Self-pay | Admitting: Family Medicine

## 2015-12-06 DIAGNOSIS — N63 Unspecified lump in unspecified breast: Secondary | ICD-10-CM

## 2015-12-17 ENCOUNTER — Ambulatory Visit
Admission: RE | Admit: 2015-12-17 | Discharge: 2015-12-17 | Disposition: A | Discharge status: Home / Self Care | Payer: BLUE CROSS/BLUE SHIELD | Source: Ambulatory Visit | Attending: Family Medicine | Admitting: Family Medicine

## 2015-12-17 DIAGNOSIS — N63 Unspecified lump in unspecified breast: Secondary | ICD-10-CM

## 2015-12-19 ENCOUNTER — Encounter: Payer: Self-pay | Admitting: Family Medicine

## 2015-12-19 ENCOUNTER — Ambulatory Visit (INDEPENDENT_AMBULATORY_CARE_PROVIDER_SITE_OTHER): Payer: BLUE CROSS/BLUE SHIELD | Admitting: Family Medicine

## 2015-12-19 VITALS — BP 154/80 | HR 89 | Temp 98.3°F | Wt 125.0 lb

## 2015-12-19 DIAGNOSIS — I10 Essential (primary) hypertension: Secondary | ICD-10-CM

## 2015-12-19 LAB — BASIC METABOLIC PANEL
BUN: 11 mg/dL (ref 6–23)
CHLORIDE: 104 meq/L (ref 96–112)
CO2: 30 mEq/L (ref 19–32)
CREATININE: 0.76 mg/dL (ref 0.40–1.20)
Calcium: 9.4 mg/dL (ref 8.4–10.5)
GFR: 98.5 mL/min (ref >60.00)
Glucose, Bld: 84 mg/dL (ref 70–99)
Potassium: 3.5 mEq/L (ref 3.5–5.1)
Sodium: 143 mEq/L (ref 135–145)

## 2015-12-19 LAB — CBC WITH DIFFERENTIAL/PLATELET
BASOS ABS: 0 10*3/uL (ref 0.0–0.1)
Basophils Relative: 0.6 % (ref 0.0–3.0)
EOS PCT: 2.1 % (ref 0.0–5.0)
Eosinophils Absolute: 0.1 10*3/uL (ref 0.0–0.7)
HCT: 35.8 % — ABNORMAL LOW (ref 36.0–46.0)
Hemoglobin: 12.1 g/dL (ref 12.0–15.0)
LYMPHS ABS: 1.8 10*3/uL (ref 0.7–4.0)
Lymphocytes Relative: 38.1 % (ref 12.0–46.0)
MCHC: 33.7 g/dL (ref 30.0–36.0)
MCV: 99.4 fl (ref 78.0–100.0)
MONO ABS: 0.4 10*3/uL (ref 0.1–1.0)
Monocytes Relative: 8.7 % (ref 3.0–12.0)
NEUTROS ABS: 2.4 10*3/uL (ref 1.4–7.7)
NEUTROS PCT: 50.5 % (ref 43.0–77.0)
PLATELETS: 283 10*3/uL (ref 150.0–400.0)
RBC: 3.6 Mil/uL — AB (ref 3.87–5.11)
RDW: 13.6 % (ref 11.5–15.5)
WBC: 4.8 10*3/uL (ref 4.0–10.5)

## 2015-12-19 LAB — POCT URINALYSIS DIPSTICK
Bilirubin, UA: NEGATIVE
Glucose, UA: NEGATIVE
Ketones, UA: NEGATIVE
Leukocytes, UA: NEGATIVE
Nitrite, UA: NEGATIVE
SPEC GRAV UA: 1.015
UROBILINOGEN UA: 0.2
pH, UA: 5.5

## 2015-12-19 LAB — TSH: TSH: 3.58 u[IU]/mL (ref 0.35–4.50)

## 2015-12-19 MED ORDER — LOSARTAN POTASSIUM 25 MG PO TABS
25.0000 mg | ORAL_TABLET | Freq: Every day | ORAL | 4 refills | Status: DC
Start: 2015-12-19 — End: 2016-03-04

## 2015-12-19 NOTE — Patient Instructions (Signed)
Cozaar 25 mg........Marland Kitchen 1 daily in the morning  Norvasc 10 mg......Marland Kitchen 1 daily in the morning  Hydrocort thiazide 12.5 mg..... One daily in the morning  Check your blood pressure daily in the morning  Return in 2 weeks for follow-up with all your blood pressure readings and the device  You can do it stop smoking completely

## 2015-12-19 NOTE — Progress Notes (Signed)
[name] is a 65 year old married female smoker but she's down to a couple cigarettes a day who comes in today for follow-up of hypertension. She's on Norvasc 10 and Hydrocort thiazide 12.5 mg daily. BP today 154/80. These are the pressure she's getting at home also. She states she is compliant in taking her medication daily. She's also changed her diet is gotten rid of salt.  Physical exam..BP (!) 154/80 (BP Location: Left Arm, Patient Position: Sitting, Cuff Size: Normal)   Pulse 89   Temp 98.3 F (36.8 C) (Oral)   Wt 125 lb (56.7 kg)   BMI 23.62 kg/m  BP right arm sitting position 154/80  Impression hypertension not at goal on current meds add Cozaar 25 mg daily continue Norvasc and Hydrocort thiazide. BP check daily follow-up in 2 weeks

## 2015-12-19 NOTE — Progress Notes (Signed)
Pre visit review using our clinic review tool, if applicable. No additional management support is needed unless otherwise documented below in the visit note. 

## 2016-01-02 ENCOUNTER — Encounter: Payer: Self-pay | Admitting: Family Medicine

## 2016-01-02 ENCOUNTER — Ambulatory Visit (INDEPENDENT_AMBULATORY_CARE_PROVIDER_SITE_OTHER): Payer: BLUE CROSS/BLUE SHIELD | Admitting: Family Medicine

## 2016-01-02 VITALS — BP 144/82 | HR 117 | Temp 98.3°F | Ht 61.0 in | Wt 126.3 lb

## 2016-01-02 DIAGNOSIS — Z720 Tobacco use: Secondary | ICD-10-CM | POA: Diagnosis not present

## 2016-01-02 DIAGNOSIS — I10 Essential (primary) hypertension: Secondary | ICD-10-CM | POA: Diagnosis not present

## 2016-01-02 NOTE — Patient Instructions (Signed)
Continue current medication  Use the other blood pressure cuff......... check your blood pressure Monday Wednesday Friday  Return in 2 months for follow-up,,,,,,,,,, bring a record of all your blood pressure readings and the device each time you come  Stop smoking completely

## 2016-01-02 NOTE — Progress Notes (Signed)
Pre visit review using our clinic review tool, if applicable. No additional management support is needed unless otherwise documented below in the visit note. 

## 2016-01-02 NOTE — Progress Notes (Signed)
[name] is a 65 year old married female smoker who comes in today for reevaluation of hypertension  We added Cozaar 25 mg to Norvasc 10 and had a core thiazide 12.5 mg because her blood pressures not at goal. She was running in the 160-170 range over 90. BP today right arm sitting position 140/80.  BP (!) 144/82 (BP Location: Left Arm, Patient Position: Sitting, Cuff Size: Normal)   Pulse (!) 117   Temp 98.3 F (36.8 C) (Oral)   Ht 5\' 1"  (1.549 m)   Wt 126 lb 4.8 oz (57.3 kg)   BMI 23.86 kg/m  Blood pressure right arm sitting position 140/80. However with her automatic device it's 20 points to on the top and 20 points to high on the diastolic. I asked her to get another cuff.  Hypertension at goal,,,,,,, continue current therapy,,,,,,, follow-up in 2 months  Tobacco abuse,,,,,,,,,, again encouraged stop smoking

## 2016-02-08 ENCOUNTER — Other Ambulatory Visit: Payer: Self-pay | Admitting: Family Medicine

## 2016-03-04 ENCOUNTER — Ambulatory Visit (INDEPENDENT_AMBULATORY_CARE_PROVIDER_SITE_OTHER): Payer: BLUE CROSS/BLUE SHIELD | Admitting: Family Medicine

## 2016-03-04 ENCOUNTER — Encounter: Payer: Self-pay | Admitting: Family Medicine

## 2016-03-04 VITALS — BP 150/90 | HR 127 | Temp 98.2°F | Ht 61.0 in | Wt 124.8 lb

## 2016-03-04 DIAGNOSIS — F339 Major depressive disorder, recurrent, unspecified: Secondary | ICD-10-CM | POA: Insufficient documentation

## 2016-03-04 DIAGNOSIS — I10 Essential (primary) hypertension: Secondary | ICD-10-CM | POA: Diagnosis not present

## 2016-03-04 MED ORDER — LOSARTAN POTASSIUM 50 MG PO TABS: 50.0000 mg | ORAL_TABLET | Freq: Every day | ORAL | 3 refills | Status: DC

## 2016-03-04 MED ORDER — TRAZODONE HCL 100 MG PO TABS: 100.0000 mg | ORAL_TABLET | Freq: Every day | ORAL | 4 refills | Status: DC

## 2016-03-04 MED ORDER — LORAZEPAM 0.5 MG PO TABS: ORAL_TABLET | ORAL | 1 refills | Status: DC

## 2016-03-04 NOTE — Patient Instructions (Signed)
Cozaar........... increased dose to 50 mg daily in the morning  Stop the Norvasc  Ativan ..............Marland Kitchen 1 tablet at bedtime when necessary  Increase the trazodone to 100 mg daily............. Call  health to see if you can access whether counselors  Follow-up in one month,,,,,,,,,,,, bring a record of all your blood pressure readings with you,,,,,,,,, remember to check your blood pressure daily

## 2016-03-04 NOTE — Progress Notes (Signed)
Pre visit review using our clinic review tool, if applicable. No additional management support is needed unless otherwise documented below in the visit note. 

## 2016-03-04 NOTE — Progress Notes (Signed)
[name] is a 66 year old married female who comes in today for follow-up of hypertension  When I walked in the room she was crying. She says her nephew committed suicide, her mother-in-law's on life-support and a sister has a terminal illness. She's been seeing Dr. Ar[facility] Persons group for many years however she says she owes them money and they won't see her until she pays her pill. She says she can't afford to go there anymore. They have her on trazodone 50 mg at bedtime.  She states she's had side effects from the increase in the Norvasc from 5 mg to 10. Should the 10 mg dose gives her headache and diarrhea.  Despite distress she is under BP is 150/90  BP (!) 150/90 (BP Location: Left Arm, Patient Position: Sitting, Cuff Size: Normal)   Pulse (!) 127   Temp 98.2 F (36.8 C) (Oral)   Ht 5\' 1"  (1.549 m)   Wt 124 lb 12.8 oz (56.6 kg)   BMI 23.58 kg/m  Well-developed well-nourished female crying obviously distressed over her above social situation  #1 hypertension not at goal....... side effects from Norvasc.... Discontinue Norvasc...Marland KitchenMarland KitchenMarland Kitchen increase Cozaar to 50 mg daily.... BP check daily follow-up in one month  #2 depression.......... increase trazodone to 100 mg at bedtime. Refill Ativan temporarily...Marland KitchenMarland KitchenMarland Kitchen encouraged her to call [facility] for follow-up

## 2016-03-17 ENCOUNTER — Encounter: Payer: Self-pay | Admitting: Family Medicine

## 2016-03-17 ENCOUNTER — Ambulatory Visit (INDEPENDENT_AMBULATORY_CARE_PROVIDER_SITE_OTHER): Payer: BLUE CROSS/BLUE SHIELD | Admitting: Family Medicine

## 2016-03-17 VITALS — BP 134/78 | HR 95 | Temp 98.3°F | Ht 61.0 in | Wt 126.2 lb

## 2016-03-17 DIAGNOSIS — R5383 Other fatigue: Secondary | ICD-10-CM | POA: Diagnosis not present

## 2016-03-17 DIAGNOSIS — F339 Major depressive disorder, recurrent, unspecified: Secondary | ICD-10-CM

## 2016-03-17 DIAGNOSIS — M545 Low back pain, unspecified: Secondary | ICD-10-CM

## 2016-03-17 DIAGNOSIS — I10 Essential (primary) hypertension: Secondary | ICD-10-CM | POA: Diagnosis not present

## 2016-03-17 MED ORDER — ESCITALOPRAM OXALATE 10 MG PO TABS: 10.0000 mg | ORAL_TABLET | Freq: Every day | ORAL | 5 refills | Status: DC

## 2016-03-17 NOTE — Progress Notes (Signed)
Subjective:  [name] is a 66 y.o. year old very pleasant female patient who presents for/with See problem oriented charting ROS- admits to depressed mood, poor sleep. No SI. No chest pain or shortness of breath   Past Medical History-  Patient Active Problem List   Diagnosis Date Noted  . Depression, recurrent ([place]) 03/04/2016    Priority: High  . Essential hypertension 09/03/2006    Priority: Medium  . Goiter, nodular 12/06/2014    Priority: Low  . NECK PAIN, CHRONIC 07/15/2007    Priority: Low  . ALLERGIC RHINITIS 09/03/2006    Priority: Low    Medications- reviewed and updated Current Outpatient Prescriptions  Medication Sig Dispense Refill  . hydrochlorothiazide (HYDRODIURIL) 12.5 MG tablet Take 1 tablet (12.5 mg total) by mouth daily. 90 tablet 3  . LORazepam (ATIVAN) 0.5 MG tablet 1 tablet at bedtime when necessary 30 tablet 1  . losartan (COZAAR) 50 MG tablet Take 1 tablet (50 mg total) by mouth daily. 90 tablet 3  . traZODone (DESYREL) 100 MG tablet Take 1 tablet (100 mg total) by mouth at bedtime. 100 tablet 4  . escitalopram (LEXAPRO) 10 MG tablet Take 1 tablet (10 mg total) by mouth daily. 30 tablet 5   No current facility-administered medications for this visit.     Objective: BP 134/78 (BP Location: Left Arm, Patient Position: Sitting, Cuff Size: Normal)   Pulse 95   Temp 98.3 F (36.8 C) (Oral)   Ht 5\' 1"  (1.549 m)   Wt 126 lb 3.2 oz (57.2 kg)   SpO2 98%   BMI 23.85 kg/m  Gen: NAD, resting comfortably CV: RRR no murmurs rubs or gallops Lungs: CTAB no crackles, wheeze, rhonchi Ext: no edema Skin: warm, dry Psych: tearful several times in visit  Assessment/Plan:  Essential hypertension S: controlled on losartan 50mg  (off amlodipine due to HA and diarrhea). States occasional loose stool BP Readings from Last 3 Encounters:  03/17/16 134/78  03/04/16 (!) 150/90  01/02/16 (!) 144/82  A/P:Continue current meds:  Improved control and lower  SE  Depression, recurrent (H[facility]) S:Also complains of fatigue and states had thyroid issues in the past. Was on medicine in past and now off. Energy level down for about 5 weeks. States got worse before the change in trazodone.   Major issues in recent memory- had lost son in last few years, lost nephew recently to suicide, mother in law had been on life support and sister with terminal illness.   PHQ9 at 22 with no SI.  A/P: Would really love to use wellbutrin as still smoking but has some baseline anxiety. We opted lexapro 10mg  instead, reduce trazodone to 50mg  (does not feel like it helps sleep and causes abdominal pain higher doses), encouraged [facility]. She also has a spiritual retreat for amonth in  [place] in April. We opted to follow up 4 weeks- sooner if worsening symptoms and warned of SI risk  Low back pain S: lower back pain off and on for about a year. Not as severe as starting about a week ago- was sweeping and then felt frozen in place. Tried icy hot pain patches. Tried vicks vaporub as well. No fecal or urinary incontinence or leg weakness A/P:no red flags. We agreed to monitor- she would prefer not to use any mor eintense pain medicines or something that would cause fatigue like flexeril for now. Can continue OTC treatments  Also down to 1 pack per week- congratulated effort. Full cessation would be  ideal and we discussed this  4 weeks- asked her to schedule 30 minute establish visit as she wants to transition her care to me from Dr. Sherren Mocha  Also update labs to make sure not another cause of depression Orders Placed This Encounter  Procedures  . TSH    [place]  . CBC with Differential/Platelet  . Comprehensive metabolic panel    [place]    Meds ordered this encounter  Medications  . escitalopram (LEXAPRO) 10 MG tablet    Sig: Take 1 tablet (10 mg total) by mouth daily.    Dispense:  30 tablet    Refill:  5    Return precautions advised.  Garret Reddish, MD

## 2016-03-17 NOTE — Patient Instructions (Signed)
I think your symptoms are coming from depression  Please stop by lab before you go- to make sure something else not causing  Start lexapro 10mg  and follow up in 1 month with me    Taking the medicine as directed and not missing any doses is one of the best things you can do to treat your depression.  Here are some things to keep in mind:  1) Side effects (stomach upset, some increased anxiety) may happen before you notice a benefit.  These side effects typically go away over time. 2) Changes to your dose of medicine or a change in medication all together is sometimes necessary 3) Most people need to be on medication at least 6-12 months 4) Many people will notice an improvement within two weeks but the full effect of the medication can take up to 4-6 weeks 5) Stopping the medication when you start feeling better often results in a return of symptoms 6) If you start having thoughts of hurting yourself or others after starting this medicine, call our office immediately at [phone] or seek care through 911.

## 2016-03-17 NOTE — Assessment & Plan Note (Signed)
S: controlled on losartan 50mg  (off amlodipine due to HA and diarrhea). States occasional loose stool BP Readings from Last 3 Encounters:  03/17/16 134/78  03/04/16 (!) 150/90  01/02/16 (!) 144/82  A/P:Continue current meds:  Improved control and lower SE

## 2016-03-17 NOTE — Progress Notes (Signed)
Pre visit review using our clinic review tool, if applicable. No additional management support is needed unless otherwise documented below in the visit note. 

## 2016-03-17 NOTE — Assessment & Plan Note (Signed)
S:Also complains of fatigue and states had thyroid issues in the past. Was on medicine in past and now off. Energy level down for about 5 weeks. States got worse before the change in trazodone.   Major issues in recent memory- had lost son in last few years, lost nephew recently to suicide, mother in law had been on life support and sister with terminal illness.   PHQ9 at 22 with no SI.  A/P: Would really love to use wellbutrin as still smoking but has some baseline anxiety. We opted lexapro 10mg  instead, reduce trazodone to 50mg  (does not feel like it helps sleep and causes abdominal pain higher doses), encouraged [facility]. She also has a spiritual retreat for amonth in  [place] in April. We opted to follow up 4 weeks- sooner if worsening symptoms and warned of SI risk

## 2016-03-18 LAB — CBC WITH DIFFERENTIAL/PLATELET
BASOS ABS: 0 10*3/uL (ref 0.0–0.1)
Basophils Relative: 0.8 % (ref 0.0–3.0)
EOS ABS: 0.1 10*3/uL (ref 0.0–0.7)
Eosinophils Relative: 1.8 % (ref 0.0–5.0)
HCT: 37.8 % (ref 36.0–46.0)
Hemoglobin: 12.6 g/dL (ref 12.0–15.0)
LYMPHS PCT: 40.3 % (ref 12.0–46.0)
Lymphs Abs: 2.4 10*3/uL (ref 0.7–4.0)
MCHC: 33.4 g/dL (ref 30.0–36.0)
MCV: 99.6 fl (ref 78.0–100.0)
MONO ABS: 0.4 10*3/uL (ref 0.1–1.0)
Monocytes Relative: 7.5 % (ref 3.0–12.0)
Neutro Abs: 2.9 10*3/uL (ref 1.4–7.7)
Neutrophils Relative %: 49.6 % (ref 43.0–77.0)
Platelets: 323 10*3/uL (ref 150.0–400.0)
RBC: 3.79 Mil/uL — ABNORMAL LOW (ref 3.87–5.11)
RDW: 13.4 % (ref 11.5–15.5)
WBC: 5.9 10*3/uL (ref 4.0–10.5)

## 2016-03-18 LAB — COMPREHENSIVE METABOLIC PANEL WITH GFR
ALT: 7 U/L (ref 0–35)
AST: 15 U/L (ref 0–37)
Albumin: 4.6 g/dL (ref 3.5–5.2)
Alkaline Phosphatase: 42 U/L (ref 39–117)
BUN: 13 mg/dL (ref 6–23)
CO2: 30 meq/L (ref 19–32)
Calcium: 10.1 mg/dL (ref 8.4–10.5)
Chloride: 104 meq/L (ref 96–112)
Creatinine, Ser: 0.89 mg/dL (ref 0.40–1.20)
GFR: 82.03 mL/min
Glucose, Bld: 93 mg/dL (ref 70–99)
Potassium: 3.2 meq/L — ABNORMAL LOW (ref 3.5–5.1)
Sodium: 142 meq/L (ref 135–145)
Total Bilirubin: 0.3 mg/dL (ref 0.2–1.2)
Total Protein: 7.5 g/dL (ref 6.0–8.3)

## 2016-03-18 LAB — TSH: TSH: 2.44 u[IU]/mL (ref 0.35–4.50)

## 2016-09-03 ENCOUNTER — Other Ambulatory Visit: Payer: Self-pay | Admitting: Family Medicine

## 2016-10-01 ENCOUNTER — Encounter: Payer: BLUE CROSS/BLUE SHIELD | Admitting: Family Medicine

## 2016-10-20 ENCOUNTER — Other Ambulatory Visit: Payer: Self-pay | Admitting: Family Medicine

## 2016-11-05 ENCOUNTER — Other Ambulatory Visit: Payer: Self-pay | Admitting: Family Medicine

## 2017-01-07 ENCOUNTER — Encounter: Payer: Self-pay | Admitting: Family Medicine

## 2017-01-07 ENCOUNTER — Ambulatory Visit (INDEPENDENT_AMBULATORY_CARE_PROVIDER_SITE_OTHER): Payer: Medicare Other | Admitting: Family Medicine

## 2017-01-07 VITALS — BP 132/76 | HR 108 | Temp 98.9°F | Wt 114.0 lb

## 2017-01-07 DIAGNOSIS — B9789 Other viral agents as the cause of diseases classified elsewhere: Secondary | ICD-10-CM

## 2017-01-07 DIAGNOSIS — J069 Acute upper respiratory infection, unspecified: Secondary | ICD-10-CM | POA: Diagnosis not present

## 2017-01-07 MED ORDER — HYDROCODONE-HOMATROPINE 5-1.5 MG/5ML PO SYRP: 5.0000 mL | ORAL_SOLUTION | Freq: Three times a day (TID) | ORAL | 0 refills | Status: DC | PRN

## 2017-01-07 NOTE — Progress Notes (Signed)
[name] is a 66 year old married female smoker who comes in today with a three-day history fever chills head congestion sore throat and cough  She states she took care of her granddaughter 10 days ago had the croup.  Skip the flu shot this fall  BP 132/76 (BP Location: Left Arm, Patient Position: Sitting, Cuff Size: Normal)   Pulse (!) 108   Temp 98.9 F (37.2 C) (Oral)   Wt 114 lb (51.7 kg)   SpO2 99%   BMI 21.54 kg/m  Will develop well-nourished female no acute distress HEENT were negative neck was supple no adenopathy. Lungs are clear to auscultation  Smells of smoke  #1 viral syndrome........... stop smoking completely........... lots of liquids.......... Hydromet when necessary for cough.

## 2017-01-07 NOTE — Patient Instructions (Signed)
Drink lots of liquids  Tylenol,,,,,,,,,,,,,,, 2 tabs every 6-8 hours for fever chills  Rest at home  Hydromet,,,,,,,,,,,,,,, 1/2-1 teaspoon 3 times daily when necessary for cough  Return when necessary  Stop smoking completely

## 2017-01-12 ENCOUNTER — Encounter (HOSPITAL_COMMUNITY): Payer: Self-pay

## 2017-01-12 ENCOUNTER — Emergency Department (HOSPITAL_COMMUNITY)
Admission: EM | Admit: 2017-01-12 | Discharge: 2017-01-12 | Disposition: A | Discharge status: Home / Self Care | Payer: Medicare Other | Attending: Emergency Medicine | Admitting: Emergency Medicine

## 2017-01-12 ENCOUNTER — Emergency Department (HOSPITAL_COMMUNITY): Payer: Medicare Other

## 2017-01-12 ENCOUNTER — Other Ambulatory Visit: Payer: Self-pay

## 2017-01-12 DIAGNOSIS — R05 Cough: Secondary | ICD-10-CM | POA: Insufficient documentation

## 2017-01-12 DIAGNOSIS — J069 Acute upper respiratory infection, unspecified: Secondary | ICD-10-CM | POA: Insufficient documentation

## 2017-01-12 DIAGNOSIS — B9789 Other viral agents as the cause of diseases classified elsewhere: Secondary | ICD-10-CM | POA: Insufficient documentation

## 2017-01-12 DIAGNOSIS — I10 Essential (primary) hypertension: Secondary | ICD-10-CM | POA: Diagnosis not present

## 2017-01-12 DIAGNOSIS — R000 Tachycardia, unspecified: Secondary | ICD-10-CM | POA: Diagnosis not present

## 2017-01-12 DIAGNOSIS — F17210 Nicotine dependence, cigarettes, uncomplicated: Secondary | ICD-10-CM | POA: Diagnosis not present

## 2017-01-12 DIAGNOSIS — R0602 Shortness of breath: Secondary | ICD-10-CM | POA: Diagnosis not present

## 2017-01-12 DIAGNOSIS — B349 Viral infection, unspecified: Secondary | ICD-10-CM | POA: Diagnosis not present

## 2017-01-12 MED ORDER — ALBUTEROL SULFATE (2.5 MG/3ML) 0.083% IN NEBU
5.0000 mg | INHALATION_SOLUTION | Freq: Once | RESPIRATORY_TRACT | Status: AC
  Administered 2017-01-12: 5 mg via RESPIRATORY_TRACT

## 2017-01-12 MED ORDER — ALBUTEROL SULFATE HFA 108 (90 BASE) MCG/ACT IN AERS
1.0000 | INHALATION_SPRAY | Freq: Once | RESPIRATORY_TRACT | Status: AC
  Administered 2017-01-12: 2 via RESPIRATORY_TRACT
  Filled 2017-01-12: qty 6.7

## 2017-01-12 MED ORDER — ALBUTEROL SULFATE (2.5 MG/3ML) 0.083% IN NEBU
INHALATION_SOLUTION | RESPIRATORY_TRACT | Status: AC
  Filled 2017-01-12: qty 3

## 2017-01-12 MED ORDER — ALBUTEROL SULFATE (2.5 MG/3ML) 0.083% IN NEBU
5.0000 mg | INHALATION_SOLUTION | Freq: Once | RESPIRATORY_TRACT | Status: AC
  Administered 2017-01-12: 5 mg via RESPIRATORY_TRACT
  Filled 2017-01-12: qty 6

## 2017-01-12 NOTE — Discharge Instructions (Signed)
Please read instructions below. You can take tylenol as needed for sore throat or body aches. Drink plenty of water.  You can use 1-2 puffs of the albuterol inhaler, every 6 hours as needed for shortness of breath or wheezing.  Follow up with your primary care provider. Return to the ER for difficulty swallowing liquids, difficulty breathing, or new or worsening symptoms.
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
## 2017-01-12 NOTE — ED Triage Notes (Signed)
She c/o cough/shortness of breath x 2-3 days. She is in no distress, but does have audible wheezes.

## 2017-01-12 NOTE — ED Notes (Signed)
ED Provider at bedside. 

## 2017-04-08 ENCOUNTER — Other Ambulatory Visit: Payer: Self-pay | Admitting: Family Medicine

## 2017-05-08 ENCOUNTER — Other Ambulatory Visit: Payer: Self-pay | Admitting: Family Medicine

## 2017-05-21 ENCOUNTER — Ambulatory Visit: Payer: Self-pay | Admitting: *Deleted

## 2017-05-21 NOTE — Telephone Encounter (Signed)
Noted  

## 2017-05-21 NOTE — Telephone Encounter (Signed)
  Patient is calling with complaints of vertigo- but her dizziness may not be linked to actual vertigo. She is having other complaints: headaches, dizziness, wt loss. Patient PCP is not available- appointment scheduled with another provider in practice. Reason for Disposition . [1] MILD dizziness (e.g., vertigo; walking normally) AND [2] has NOT been evaluated by physician for this  Answer Assessment - Initial Assessment Questions 1. DESCRIPTION: "Describe your dizziness."     When gets up too fast- patient has to sit down- she has a split second of dizziness 2. VERTIGO: "Do you feel like either you or the room is spinning or tilting?"      Patient feels like she is tilting 3. LIGHTHEADED: "Do you feel lightheaded?" (e.g., somewhat faint, woozy, weak upon standing)     Yes- she feels lightheaded 4. SEVERITY: "How bad is it?"  "Can you walk?"   - MILD - Feels unsteady but walking normally.   - MODERATE - Feels very unsteady when walking, but not falling; interferes with normal activities (e.g., school, work) .   - SEVERE - Unable to walk without falling (requires assistance).     mild 5. ONSET:  "When did the dizziness begin?"     Started after the stomach virus 6. AGGRAVATING FACTORS: "Does anything make it worse?" (e.g., standing, change in head position)     Stress 7. CAUSE: "What do you think is causing the dizziness?"     Patient has had weight loss- dehydration 8. RECURRENT SYMPTOM: "Have you had dizziness before?" If so, ask: "When was the last time?" "What happened that time?"     no 9. OTHER SYMPTOMS: "Do you have any other symptoms?" (e.g., headache, weakness, numbness, vomiting, earache)     Headache, nausea, weight loss 10. PREGNANCY: "Is there any chance you are pregnant?" "When was your last menstrual period?"       n/a  Protocols used: DIZZINESS - VERTIGO-A-AH

## 2017-05-22 ENCOUNTER — Ambulatory Visit (INDEPENDENT_AMBULATORY_CARE_PROVIDER_SITE_OTHER): Payer: Medicare Other | Admitting: Family Medicine

## 2017-05-22 ENCOUNTER — Encounter: Payer: Self-pay | Admitting: Family Medicine

## 2017-05-22 VITALS — BP 102/70 | HR 102 | Temp 98.3°F | Ht 61.0 in | Wt 104.5 lb

## 2017-05-22 DIAGNOSIS — Z1211 Encounter for screening for malignant neoplasm of colon: Secondary | ICD-10-CM | POA: Diagnosis not present

## 2017-05-22 DIAGNOSIS — R5383 Other fatigue: Secondary | ICD-10-CM | POA: Diagnosis not present

## 2017-05-22 DIAGNOSIS — Z1321 Encounter for screening for nutritional disorder: Secondary | ICD-10-CM

## 2017-05-22 DIAGNOSIS — R1084 Generalized abdominal pain: Secondary | ICD-10-CM

## 2017-05-22 DIAGNOSIS — R634 Abnormal weight loss: Secondary | ICD-10-CM

## 2017-05-22 LAB — COMPREHENSIVE METABOLIC PANEL
ALT: 20 U/L (ref 0–35)
AST: 26 U/L (ref 0–37)
Albumin: 3.9 g/dL (ref 3.5–5.2)
Alkaline Phosphatase: 53 U/L (ref 39–117)
BUN: 9 mg/dL (ref 6–23)
CALCIUM: 9.5 mg/dL (ref 8.4–10.5)
CHLORIDE: 96 meq/L (ref 96–112)
CO2: 33 meq/L — AB (ref 19–32)
Creatinine, Ser: 0.92 mg/dL (ref 0.40–1.20)
GFR: 78.66 mL/min (ref >60.00)
Glucose, Bld: 82 mg/dL (ref 70–99)
POTASSIUM: 3.7 meq/L (ref 3.5–5.1)
Sodium: 137 mEq/L (ref 135–145)
Total Bilirubin: 0.3 mg/dL (ref 0.2–1.2)
Total Protein: 6.9 g/dL (ref 6.0–8.3)

## 2017-05-22 LAB — CBC WITH DIFFERENTIAL/PLATELET
BASOS ABS: 0.1 10*3/uL (ref 0.0–0.1)
Basophils Relative: 1.3 % (ref 0.0–3.0)
EOS ABS: 0.1 10*3/uL (ref 0.0–0.7)
Eosinophils Relative: 1 % (ref 0.0–5.0)
HEMATOCRIT: 30.5 % — AB (ref 36.0–46.0)
Hemoglobin: 10.5 g/dL — ABNORMAL LOW (ref 12.0–15.0)
LYMPHS PCT: 29.3 % (ref 12.0–46.0)
Lymphs Abs: 1.8 10*3/uL (ref 0.7–4.0)
MCHC: 34.4 g/dL (ref 30.0–36.0)
MCV: 103.4 fl — AB (ref 78.0–100.0)
MONOS PCT: 9.7 % (ref 3.0–12.0)
Monocytes Absolute: 0.6 10*3/uL (ref 0.1–1.0)
Neutro Abs: 3.6 10*3/uL (ref 1.4–7.7)
Neutrophils Relative %: 58.7 % (ref 43.0–77.0)
Platelets: 386 10*3/uL (ref 150.0–400.0)
RBC: 2.95 Mil/uL — AB (ref 3.87–5.11)
RDW: 12.8 % (ref 11.5–15.5)
WBC: 6.2 10*3/uL (ref 4.0–10.5)

## 2017-05-22 LAB — LIPASE: Lipase: 20 U/L (ref 11.0–59.0)

## 2017-05-22 MED ORDER — PANTOPRAZOLE SODIUM 20 MG PO TBEC: 20.0000 mg | DELAYED_RELEASE_TABLET | Freq: Every day | ORAL | 3 refills | Status: DC

## 2017-05-22 NOTE — Patient Instructions (Addendum)
Abdominal Pain, Adult Abdominal pain can be caused by many things. Often, abdominal pain is not serious and it gets better with no treatment or by being treated at home. However, sometimes abdominal pain is serious. Your health care provider will do a medical history and a physical exam to try to determine the cause of your abdominal pain. Follow these instructions at home:  Take over-the-counter and prescription medicines only as told by your health care provider. Do not take a laxative unless told by your health care provider.  Drink enough fluid to keep your urine clear or pale yellow.  Watch your condition for any changes.  Keep all follow-up visits as told by your health care provider. This is important. Contact a health care provider if:  Your abdominal pain changes or gets worse.  You are not hungry or you lose weight without trying.  You are constipated or have diarrhea for more than 2-3 days.  You have pain when you urinate or have a bowel movement.  Your abdominal pain wakes you up at night.  Your pain gets worse with meals, after eating, or with certain foods.  You are throwing up and cannot keep anything down.  You have a fever. Get help right away if:  Your pain does not go away as soon as your health care provider told you to expect.  You cannot stop throwing up.  Your pain is only in areas of the abdomen, such as the right side or the left lower portion of the abdomen.  You have bloody or black stools, or stools that look like tar.  You have severe pain, cramping, or bloating in your abdomen.  You have signs of dehydration, such as: ? Dark urine, very little urine, or no urine. ? Cracked lips. ? Dry mouth. ? Sunken eyes. ? Sleepiness. ? Weakness. This information is not intended to replace advice given to you by your health care provider. Make sure you discuss any questions you have with your health care provider. Document Released: 10/16/2004 Document  Revised: 07/27/2015 Document Reviewed: 06/20/2015 Elsevier Interactive Patient Education  2018 Elsevier Inc.  Food Choices for Gastroesophageal Reflux Disease, Adult When you have gastroesophageal reflux disease (GERD), the foods you eat and your eating habits are very important. Choosing the right foods can help ease your discomfort. What guidelines do I need to follow?  Choose fruits, vegetables, whole grains, and low-fat dairy products.  Choose low-fat meat, fish, and poultry.  Limit fats such as oils, salad dressings, butter, nuts, and avocado.  Keep a food diary. This helps you identify foods that cause symptoms.  Avoid foods that cause symptoms. These may be different for everyone.  Eat small meals often instead of 3 large meals a day.  Eat your meals slowly, in a place where you are relaxed.  Limit fried foods.  Cook foods using methods other than frying.  Avoid drinking alcohol.  Avoid drinking large amounts of liquids with your meals.  Avoid bending over or lying down until 2-3 hours after eating. What foods are not recommended? These are some foods and drinks that may make your symptoms worse: Vegetables Tomatoes. Tomato juice. Tomato and spaghetti sauce. Chili peppers. Onion and garlic. Horseradish. Fruits Oranges, grapefruit, and lemon (fruit and juice). Meats High-fat meats, fish, and poultry. This includes hot dogs, ribs, ham, sausage, salami, and bacon. Dairy Whole milk and chocolate milk. Sour cream. Cream. Butter. Ice cream. Cream cheese. Drinks Coffee and tea. Bubbly (carbonated) drinks or energy drinks.   Condiments Hot sauce. Barbecue sauce. Sweets/Desserts Chocolate and cocoa. Donuts. Peppermint and spearmint. Fats and Oils High-fat foods. This includes [place] fries and potato chips. Other Vinegar. Strong spices. This includes black pepper, white pepper, red pepper, cayenne, curry powder, cloves, ginger, and chili powder. The items listed above  may not be a complete list of foods and drinks to avoid. Contact your dietitian for more information. This information is not intended to replace advice given to you by your health care provider. Make sure you discuss any questions you have with your health care provider. Document Released: 07/08/2011 Document Revised: 06/14/2015 Document Reviewed: 11/10/2012 Elsevier Interactive Patient Education  2017 [employer].  Gastroesophageal Reflux Disease, Adult Normally, food travels down the esophagus and stays in the stomach to be digested. However, when a person has gastroesophageal reflux disease (GERD), food and stomach acid move back up into the esophagus. When this happens, the esophagus becomes sore and inflamed. Over time, GERD can create small holes (ulcers) in the lining of the esophagus. What are the causes? This condition is caused by a problem with the muscle between the esophagus and the stomach (lower esophageal sphincter, or LES). Normally, the LES muscle closes after food passes through the esophagus to the stomach. When the LES is weakened or abnormal, it does not close properly, and that allows food and stomach acid to go back up into the esophagus. The LES can be weakened by certain dietary substances, medicines, and medical conditions, including:  Tobacco use.  Pregnancy.  Having a hiatal hernia.  Heavy alcohol use.  Certain foods and beverages, such as coffee, chocolate, onions, and peppermint.  What increases the risk? This condition is more likely to develop in:  People who have an increased body weight.  People who have connective tissue disorders.  People who use NSAID medicines.  What are the signs or symptoms? Symptoms of this condition include:  Heartburn.  Difficult or painful swallowing.  The feeling of having a lump in the throat.  Abitter taste in the mouth.  Bad breath.  Having a large amount of saliva.  Having an upset or bloated  stomach.  Belching.  Chest pain.  Shortness of breath or wheezing.  Ongoing (chronic) cough or a night-time cough.  Wearing away of tooth enamel.  Weight loss.  Different conditions can cause chest pain. Make sure to see your health care provider if you experience chest pain. How is this diagnosed? Your health care provider will take a medical history and perform a physical exam. To determine if you have mild or severe GERD, your health care provider may also monitor how you respond to treatment. You may also have other tests, including:  An endoscopy toexamine your stomach and esophagus with a small camera.  A test thatmeasures the acidity level in your esophagus.  A test thatmeasures how much pressure is on your esophagus.  A barium swallow or modified barium swallow to show the shape, size, and functioning of your esophagus.  How is this treated? The goal of treatment is to help relieve your symptoms and to prevent complications. Treatment for this condition may vary depending on how severe your symptoms are. Your health care provider may recommend:  Changes to your diet.  Medicine.  Surgery.  Follow these instructions at home: Diet  Follow a diet as recommended by your health care provider. This may involve avoiding foods and drinks such as: ? Coffee and tea (with or without caffeine). ? Drinks that containalcohol. ? Energy  drinks and sports drinks. ? Carbonated drinks or sodas. ? Chocolate and cocoa. ? Peppermint and mint flavorings. ? Garlic and onions. ? Horseradish. ? Spicy and acidic foods, including peppers, chili powder, curry powder, vinegar, hot sauces, and barbecue sauce. ? Citrus fruit juices and citrus fruits, such as oranges, lemons, and limes. ? Tomato-based foods, such as red sauce, chili, salsa, and pizza with red sauce. ? Fried and fatty foods, such as donuts, french fries, potato chips, and high-fat dressings. ? High-fat meats, such as hot  dogs and fatty cuts of red and white meats, such as rib eye steak, sausage, ham, and bacon. ? High-fat dairy items, such as whole milk, butter, and cream cheese.  Eat small, frequent meals instead of large meals.  Avoid drinking large amounts of liquid with your meals.  Avoid eating meals during the 2-3 hours before bedtime.  Avoid lying down right after you eat.  Do not exercise right after you eat. General instructions  Pay attention to any changes in your symptoms.  Take over-the-counter and prescription medicines only as told by your health care provider. Do not take aspirin, ibuprofen, or other NSAIDs unless your health care provider told you to do so.  Do not use any tobacco products, including cigarettes, chewing tobacco, and e-cigarettes. If you need help quitting, ask your health care provider.  Wear loose-fitting clothing. Do not wear anything tight around your waist that causes pressure on your abdomen.  Raise (elevate) the head of your bed 6 inches (15cm).  Try to reduce your stress, such as with yoga or meditation. If you need help reducing stress, ask your health care provider.  If you are overweight, reduce your weight to an amount that is healthy for you. Ask your health care provider for guidance about a safe weight loss goal.  Keep all follow-up visits as told by your health care provider. This is important. Contact a health care provider if:  You have new symptoms.  You have unexplained weight loss.  You have difficulty swallowing, or it hurts to swallow.  You have wheezing or a persistent cough.  Your symptoms do not improve with treatment.  You have a hoarse voice. Get help right away if:  You have pain in your arms, neck, jaw, teeth, or back.  You feel sweaty, dizzy, or light-headed.  You have chest pain or shortness of breath.  You vomit and your vomit looks like blood or coffee grounds.  You faint.  Your stool is bloody or black.  You  cannot swallow, drink, or eat. This information is not intended to replace advice given to you by your health care provider. Make sure you discuss any questions you have with your health care provider. Document Released: 10/16/2004 Document Revised: 06/06/2015 Document Reviewed: 05/03/2014 Elsevier Interactive Patient Education  [employer].
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
## 2017-05-23 LAB — T4, FREE: FREE T4: 0.57 ng/dL — AB (ref 0.60–1.60)

## 2017-05-23 LAB — TSH: TSH: 3.88 u[IU]/mL (ref 0.35–4.50)

## 2017-05-28 ENCOUNTER — Telehealth: Payer: Self-pay | Admitting: Family Medicine

## 2017-05-28 NOTE — Telephone Encounter (Unsigned)
Copied from [address] [id number]. Topic: Quick Communication - Lab Results >> May 28, 2017 11:33 AM Carolyn Stare wrote:  Pt calling back for lab results   [phone]

## 2017-05-29 NOTE — Telephone Encounter (Signed)
See result note.  

## 2017-06-01 ENCOUNTER — Other Ambulatory Visit: Payer: Self-pay | Admitting: *Deleted

## 2017-06-01 DIAGNOSIS — E039 Hypothyroidism, unspecified: Secondary | ICD-10-CM

## 2017-06-01 DIAGNOSIS — E038 Other specified hypothyroidism: Secondary | ICD-10-CM

## 2017-06-03 ENCOUNTER — Telehealth: Payer: Self-pay | Admitting: Gastroenterology

## 2017-06-03 NOTE — Telephone Encounter (Signed)
Hi Dr. Fuller Plan, we received a referral for this pt for a repeat colon. She had a colon with you in 2007 and then she transfered care to [facility] and had a repeat colon in 2014 with Dr. Amedeo Plenty. She was not please with her care at [facility]. Therefore, she would like to transfer back to [place] and would like to see you again. She is also having some GI symptoms. Are you ok with seeing patient again? Records have been placed on your desk for review. Thank you.

## 2017-06-04 ENCOUNTER — Other Ambulatory Visit: Payer: Self-pay | Admitting: Family Medicine

## 2017-06-04 DIAGNOSIS — D649 Anemia, unspecified: Secondary | ICD-10-CM

## 2017-06-09 NOTE — Telephone Encounter (Signed)
Records reviewed by physician and if there is no family history of polyps or Colon CA the next Colonoscopy should be March 2024. If patient is having GI symptoms she will need an office visit. I have called Mrs Loma and left her a voicemail to call back. Dr Fuller Plan has accepted her transfer request back to [place].

## 2017-06-12 ENCOUNTER — Encounter: Payer: Self-pay | Admitting: Gastroenterology

## 2017-07-09 ENCOUNTER — Ambulatory Visit (INDEPENDENT_AMBULATORY_CARE_PROVIDER_SITE_OTHER): Payer: Medicare Other | Admitting: Physician Assistant

## 2017-07-09 ENCOUNTER — Other Ambulatory Visit (INDEPENDENT_AMBULATORY_CARE_PROVIDER_SITE_OTHER): Payer: Medicare Other

## 2017-07-09 ENCOUNTER — Encounter: Payer: Self-pay | Admitting: Physician Assistant

## 2017-07-09 ENCOUNTER — Encounter (INDEPENDENT_AMBULATORY_CARE_PROVIDER_SITE_OTHER): Payer: Self-pay

## 2017-07-09 VITALS — BP 122/74 | HR 86 | Ht 61.0 in | Wt 106.0 lb

## 2017-07-09 DIAGNOSIS — D649 Anemia, unspecified: Secondary | ICD-10-CM

## 2017-07-09 DIAGNOSIS — R109 Unspecified abdominal pain: Secondary | ICD-10-CM

## 2017-07-09 DIAGNOSIS — R194 Change in bowel habit: Secondary | ICD-10-CM

## 2017-07-09 DIAGNOSIS — R634 Abnormal weight loss: Secondary | ICD-10-CM | POA: Diagnosis not present

## 2017-07-09 DIAGNOSIS — K921 Melena: Secondary | ICD-10-CM | POA: Diagnosis not present

## 2017-07-09 LAB — CBC WITH DIFFERENTIAL/PLATELET
BASOS ABS: 0.1 10*3/uL (ref 0.0–0.1)
Basophils Relative: 1.1 % (ref 0.0–3.0)
EOS ABS: 0.1 10*3/uL (ref 0.0–0.7)
Eosinophils Relative: 1 % (ref 0.0–5.0)
HCT: 31.8 % — ABNORMAL LOW (ref 36.0–46.0)
HEMOGLOBIN: 11.1 g/dL — AB (ref 12.0–15.0)
LYMPHS ABS: 2.2 10*3/uL (ref 0.7–4.0)
Lymphocytes Relative: 36.8 % (ref 12.0–46.0)
MCHC: 34.9 g/dL (ref 30.0–36.0)
MCV: 102.1 fl — ABNORMAL HIGH (ref 78.0–100.0)
MONO ABS: 0.6 10*3/uL (ref 0.1–1.0)
Monocytes Relative: 9.6 % (ref 3.0–12.0)
NEUTROS PCT: 51.5 % (ref 43.0–77.0)
Neutro Abs: 3.1 10*3/uL (ref 1.4–7.7)
Platelets: 296 10*3/uL (ref 150.0–400.0)
RBC: 3.11 Mil/uL — AB (ref 3.87–5.11)
RDW: 12.4 % (ref 11.5–15.5)
WBC: 6.1 10*3/uL (ref 4.0–10.5)

## 2017-07-09 LAB — FERRITIN: FERRITIN: 91.3 ng/mL (ref 10.0–291.0)

## 2017-07-09 LAB — IBC PANEL
IRON: 91 ug/dL (ref 42–145)
SATURATION RATIOS: 25.8 % (ref 20.0–50.0)
Transferrin: 252 mg/dL (ref 212.0–360.0)

## 2017-07-09 LAB — VITAMIN B12: VITAMIN B 12: 233 pg/mL (ref 211–911)

## 2017-07-09 MED ORDER — HYOSCYAMINE SULFATE 0.125 MG SL SUBL: 0.1250 mg | SUBLINGUAL_TABLET | SUBLINGUAL | 1 refills | Status: DC | PRN

## 2017-07-09 NOTE — Progress Notes (Signed)
Reviewed and agree with initial management plan.  Kaela Beitz T. Cymone Yeske, MD FACG 

## 2017-07-09 NOTE — Patient Instructions (Signed)
If you are age 66 or older, your body mass index should be between 23-30. Your Body mass index is 20.03 kg/m. If this is out of the aforementioned range listed, please consider follow up with your Primary Care Provider.  If you are age 93 or younger, your body mass index should be between 19-25. Your Body mass index is 20.03 kg/m. If this is out of the aformentioned range listed, please consider follow up with your Primary Care Provider.   Your provider has requested that you go to the basement level for lab work before leaving today. Press "B" on the elevator. The lab is located at the first door on the left as you exit the elevator.  We have sent the following medications to your pharmacy for you to pick up at your convenience: Hyoscyamine   Fiber 25-35 grams daily You have been given a High Fiber Diet handout.  Probiotic - Align daily for 2-3 months. (over-the-counter)   Thank you for choosing me and [facility].   Ellouise Newer, PA-C
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
## 2017-07-14 ENCOUNTER — Telehealth: Payer: Self-pay

## 2017-07-14 NOTE — Telephone Encounter (Signed)
-----   Message from Levin Erp, [place] sent at 07/14/2017  9:47 AM EDT ----- Regarding: FW: needs double Can you please put this patient on Dr. Steve Rattler schedule. Thanks-JLL  ----- Message ----- From: Jackquline Denmark, MD Sent: 07/14/2017   9:23 AM To: Levin Erp, PA Subject: RE: needs double                               No problems at all. Please put her on my schedule for double. Merrie Roof ----- Message ----- From: Levin Erp, PA Sent: 07/13/2017   9:23 AM To: Jackquline Denmark, MD Subject: FW: needs double                               Would you mind helping me/Dr. Fuller Plan out?  Thanks-JLL  ----- Message ----- From: Lowell Guitar, RMA Sent: 07/13/2017   9:15 AM To: Levin Erp, PA Subject: FW: needs double                               Claris Gladden, I looked at Dr. Steve Rattler schedule.  He has several double slots starting on 07/29/17.  Are you going to talk with him about putting this patient on? Thanks, Peter Congo ----- Message ----- From: Levin Erp, PA Sent: 07/09/2017   3:43 PM To: Lowell Guitar, RMA Subject: FW: needs double                               Peter Congo, can you look on Dr. Steve Rattler schedule for a double- thanks-JLL  ----- Message ----- From: Ladene Artist, MD Sent: 07/09/2017   2:31 PM To: Levin Erp, PA Subject: RE: needs double                               Plz ask to patient to schedule EGD and colonoscopy separately if I don't have a double slot available or ask Dr. Lyndel Safe or other MD to do the procedures. Thx.   ----- Message ----- From: Levin Erp, PA Sent: 07/09/2017   2:21 PM To: Ladene Artist, MD Subject: needs double                                   Please see OV note from today, needs double- anywhere you can squeeze her in? Thanks-JLL

## 2017-07-14 NOTE — Telephone Encounter (Signed)
The pt has been scheduled for colon endo with Dr Lyndel Safe and previsit on 7/9.

## 2017-07-28 ENCOUNTER — Other Ambulatory Visit: Payer: Self-pay

## 2017-07-28 ENCOUNTER — Ambulatory Visit (AMBULATORY_SURGERY_CENTER): Payer: Self-pay | Admitting: *Deleted

## 2017-07-28 VITALS — Ht 59.0 in | Wt 107.0 lb

## 2017-07-28 DIAGNOSIS — D649 Anemia, unspecified: Secondary | ICD-10-CM

## 2017-07-28 DIAGNOSIS — K921 Melena: Secondary | ICD-10-CM

## 2017-07-28 MED ORDER — SUPREP BOWEL PREP KIT 17.5-3.13-1.6 GM/177ML PO SOLN: 1.0000 | Freq: Once | ORAL | 0 refills | Status: AC

## 2017-07-28 NOTE — Progress Notes (Signed)
No egg or soy allergy known to patient  No issues with past sedation with any surgeries  or procedures, no intubation problems  No diet pills per patient No home 02 use per patient  No blood thinners per patient  Pt denies issues with constipation  No A fib or A flutter  EMMI video sent to pt's e mail  Suprep sample given Lot # [id number], Exp 06/21

## 2017-07-29 ENCOUNTER — Other Ambulatory Visit (INDEPENDENT_AMBULATORY_CARE_PROVIDER_SITE_OTHER): Payer: Medicare Other

## 2017-07-29 DIAGNOSIS — E038 Other specified hypothyroidism: Secondary | ICD-10-CM

## 2017-07-29 DIAGNOSIS — E039 Hypothyroidism, unspecified: Secondary | ICD-10-CM

## 2017-07-29 LAB — T4, FREE: FREE T4: 0.59 ng/dL — AB (ref 0.60–1.60)

## 2017-07-29 LAB — TSH: TSH: 3.51 u[IU]/mL (ref 0.35–4.50)

## 2017-08-02 ENCOUNTER — Other Ambulatory Visit: Payer: Self-pay | Admitting: Family Medicine

## 2017-08-03 ENCOUNTER — Ambulatory Visit (AMBULATORY_SURGERY_CENTER): Payer: Medicare Other | Admitting: Gastroenterology

## 2017-08-03 ENCOUNTER — Encounter: Payer: Self-pay | Admitting: Gastroenterology

## 2017-08-03 VITALS — BP 166/99 | HR 87 | Temp 98.0°F | Resp 20 | Ht 61.0 in | Wt 106.0 lb

## 2017-08-03 DIAGNOSIS — R634 Abnormal weight loss: Secondary | ICD-10-CM | POA: Diagnosis not present

## 2017-08-03 DIAGNOSIS — D649 Anemia, unspecified: Secondary | ICD-10-CM

## 2017-08-03 DIAGNOSIS — K2970 Gastritis, unspecified, without bleeding: Secondary | ICD-10-CM | POA: Diagnosis not present

## 2017-08-03 DIAGNOSIS — K2951 Unspecified chronic gastritis with bleeding: Secondary | ICD-10-CM | POA: Diagnosis not present

## 2017-08-03 DIAGNOSIS — K921 Melena: Secondary | ICD-10-CM

## 2017-08-03 DIAGNOSIS — I10 Essential (primary) hypertension: Secondary | ICD-10-CM | POA: Diagnosis not present

## 2017-08-03 DIAGNOSIS — G894 Chronic pain syndrome: Secondary | ICD-10-CM | POA: Diagnosis not present

## 2017-08-03 DIAGNOSIS — K219 Gastro-esophageal reflux disease without esophagitis: Secondary | ICD-10-CM | POA: Diagnosis not present

## 2017-08-03 DIAGNOSIS — D120 Benign neoplasm of cecum: Secondary | ICD-10-CM

## 2017-08-03 MED ORDER — SODIUM CHLORIDE 0.9 % IV SOLN: 500.0000 mL | Freq: Once | INTRAVENOUS | Status: DC

## 2017-08-03 NOTE — Op Note (Signed)
[facility] Patient Name: [name] Procedure Date: 08/03/2017 1:35 PM MRN: [id number] Endoscopist: Jackquline Denmark , MD Age: 67 Referring MD:  Date of Birth: May 13, 1950 Gender: Female Account #: [id number] Procedure:                Upper GI endoscopy Indications:              Epigastric abdominal pain, Weight loss, macrocytic                            anemia. Medicines:                Monitored Anesthesia Care Procedure:                Pre-Anesthesia Assessment:                           - Prior to the procedure, a History and Physical                            was performed, and patient medications and                            allergies were reviewed. The patient is competent.                            The risks and benefits of the procedure and the                            sedation options and risks were discussed with the                            patient. All questions were answered and informed                            consent was obtained. Patient identification and                            proposed procedure were verified by the physician                            in the procedure room. Mental Status Examination:                            alert and oriented. Prophylactic Antibiotics: The                            patient does not require prophylactic antibiotics.                            Prior Anticoagulants: The patient has taken no                            previous anticoagulant or antiplatelet agents. ASA  Grade Assessment: II - A patient with mild systemic                            disease. After reviewing the risks and benefits,                            the patient was deemed in satisfactory condition to                            undergo the procedure. The anesthesia plan was to                            use monitored anesthesia care (MAC). Immediately                            prior to administration of  medications, the patient                            was re-assessed for adequacy to receive sedatives.                            The heart rate, respiratory rate, oxygen                            saturations, blood pressure, adequacy of pulmonary                            ventilation, and response to care were monitored                            throughout the procedure. The physical status of                            the patient was re-assessed after the procedure.                           After obtaining informed consent, the endoscope was                            passed under direct vision. Throughout the                            procedure, the patient's blood pressure, pulse, and                            oxygen saturations were monitored continuously. The                            Endoscope was introduced through the mouth, and                            advanced to the second part of duodenum. The upper  GI endoscopy was accomplished without difficulty.                            The patient tolerated the procedure well. Scope In: Scope Out: Findings:                 Localized mild inflammation characterized by                            erythema was found in the gastric antrum. Biopsies                            were taken with a cold forceps for histology.                            Biopsies for histology were taken from the second                            portion of the duodenum with a cold forceps for                            evaluation of celiac disease.                           The exam was otherwise without abnormality. Complications:            No immediate complications. Estimated Blood Loss:     Estimated blood loss: none. Impression:               - Mild Gastritis. Biopsied.                           - The examination was otherwise normal. Recommendation:           - Patient has a contact number available for                             emergencies. The signs and symptoms of potential                            delayed complications were discussed with the                            patient. Return to normal activities tomorrow.                            Written discharge instructions were provided to the                            patient.                           - Resume previous diet.                           - Continue present medications.                           -  Await pathology results.                           - proceed with colonoscopy. Jackquline Denmark, MD 08/03/2017 2:12:54 PM This report has been signed electronically.

## 2017-08-03 NOTE — Progress Notes (Signed)
approx 1401 pt noted to de gulping, suction started, gulping progressed to vomitting, bile colored, 100cc in cannister and more on pillow.  Head was dropped into steep t burg as soon as bile colored fluid came up and sedation was stopped

## 2017-08-03 NOTE — Op Note (Signed)
[facility] Patient Name: [name] Procedure Date: 08/03/2017 1:35 PM MRN: [id number] Endoscopist: Jackquline Denmark , MD Age: 67 Referring MD:  Date of Birth: 1950-12-28 Gender: Female Account #: [id number] Procedure:                Colonoscopy Indications:              Rectal bleeding with anemia and history of weight                            loss. Medicines:                Monitored Anesthesia Care Procedure:                Pre-Anesthesia Assessment:                           - Prior to the procedure, a History and Physical                            was performed, and patient medications and                            allergies were reviewed. The patient is competent.                            The risks and benefits of the procedure and the                            sedation options and risks were discussed with the                            patient. All questions were answered and informed                            consent was obtained. Patient identification and                            proposed procedure were verified by the physician                            in the procedure room. Mental Status Examination:                            alert and oriented. Prophylactic Antibiotics: The                            patient does not require prophylactic antibiotics.                            Prior Anticoagulants: The patient has taken no                            previous anticoagulant or antiplatelet agents. ASA  Grade Assessment: II - A patient with mild systemic                            disease. After reviewing the risks and benefits,                            the patient was deemed in satisfactory condition to                            undergo the procedure. The anesthesia plan was to                            use monitored anesthesia care (MAC). Immediately                            prior to administration of medications,  the patient                            was re-assessed for adequacy to receive sedatives.                            The heart rate, respiratory rate, oxygen                            saturations, blood pressure, adequacy of pulmonary                            ventilation, and response to care were monitored                            throughout the procedure. The physical status of                            the patient was re-assessed after the procedure.                           After obtaining informed consent, the colonoscope                            was passed under direct vision. Throughout the                            procedure, the patient's blood pressure, pulse, and                            oxygen saturations were monitored continuously. The                            Colonoscope was introduced through the anus and                            advanced to the 2 cm into the ileum. The  colonoscopy was performed without difficulty. The                            patient tolerated the procedure well. The quality                            of the bowel preparation was good. Scope In: 1:54:28 PM Scope Out: 2:07:30 PM Scope Withdrawal Time: 0 hours 9 minutes 42 seconds  Total Procedure Duration: 0 hours 13 minutes 2 seconds  Findings:                 A 6 mm polyp was found in the cecum. The polyp was                            sessile. The polyp was removed with a cold snare.                            Resection and retrieval were complete. Estimated                            blood loss was minimal.                           A few rare diverticula were found in the sigmoid                            colon.                           Non-bleeding internal hemorrhoids were found during                            retroflexion. The hemorrhoids were small.                           The exam was otherwise without abnormality. Complications:            No  immediate complications. Estimated Blood Loss:     Estimated blood loss: none. Impression:               - Small colonic polyp status post polypectomy.                           - mild sigmoid diverticulosis.                           - Non-bleeding internal hemorrhoids.                           - The examination was otherwise normal. Recommendation:           - Patient has a contact number available for                            emergencies. The signs and symptoms of potential  delayed complications were discussed with the                            patient. Return to normal activities tomorrow.                            Written discharge instructions were provided to the                            patient.                           - Resume previous diet.                           - Continue present medications.                           - Await pathology results.                           - Repeat colonoscopy for surveillance based on                            pathology results.                           - If there is any further rectal bleeding, she                            would use Preparation H one twice a day after the                            bowel movement for 7-10 days.                           - Return to GI clinic (with Dr. Fuller Plan) in 8-12                            weeks for further evaluation, if needed. Jackquline Denmark, MD 08/03/2017 2:19:07 PM This report has been signed electronically.

## 2017-08-03 NOTE — Progress Notes (Signed)
Report to PACU, RN, vss, BBS= Clear.  

## 2017-08-03 NOTE — Progress Notes (Signed)
Pt's states no medical or surgical changes since previsit or office visit. 

## 2017-08-03 NOTE — Progress Notes (Signed)
Called to room to assist during endoscopic procedure.  Patient ID and intended procedure confirmed with present staff. Received instructions for my participation in the procedure from the performing physician.  

## 2017-08-03 NOTE — Patient Instructions (Signed)
INFORMATION ON POLYPS ,DIVERTICULOSIS,AND HEMORRHOIDS GIVEN TO YOU TODAY  IF YOU HAVE FURTHER BLEEDING ,USE PREPARATION H ONE SUPPOSITORY TWICE DAILY AFTER A BOWEL MOVEMENT FOR 7-10 DAYS  RETURN APPOINTMENT WITH DR Fuller Plan IN 8- 12 WEEKS FOR FURTHER EVALUATION IF NEEDED   INFORMATION ON GASTRITIS GIVEN TO YOU TODAY  AWAIT PATHOLOGY RESULTS ON THE POLYP REMOVED TODAY AND ON THE GASTRITIS BIOPSIES   YOU HAD AN ENDOSCOPIC PROCEDURE TODAY AT [facility]:   Refer to the procedure report that was given to you for any specific questions about what was found during the examination.  If the procedure report does not answer your questions, please call your gastroenterologist to clarify.  If you requested that your care partner not be given the details of your procedure findings, then the procedure report has been included in a sealed envelope for you to review at your convenience later.  YOU SHOULD EXPECT: Some feelings of bloating in the abdomen. Passage of more gas than usual.  Walking can help get rid of the air that was put into your GI tract during the procedure and reduce the bloating. If you had a lower endoscopy (such as a colonoscopy or flexible sigmoidoscopy) you may notice spotting of blood in your stool or on the toilet paper. If you underwent a bowel prep for your procedure, you may not have a normal bowel movement for a few days.  Please Note:  You might notice some irritation and congestion in your nose or some drainage.  This is from the oxygen used during your procedure.  There is no need for concern and it should clear up in a day or so.  SYMPTOMS TO REPORT IMMEDIATELY:   Following lower endoscopy (colonoscopy or flexible sigmoidoscopy):  Excessive amounts of blood in the stool  Significant tenderness or worsening of abdominal pains  Swelling of the abdomen that is new, acute  Fever of 100F or higher   Following upper endoscopy (EGD)  Vomiting of blood or  coffee ground material  New chest pain or pain under the shoulder blades  Painful or persistently difficult swallowing  New shortness of breath  Fever of 100F or higher  Black, tarry-looking stools  For urgent or emergent issues, a gastroenterologist can be reached at any hour by calling [phone].   DIET:  We do recommend a small meal at first, but then you may proceed to your regular diet.  Drink plenty of fluids but you should avoid alcoholic beverages for 24 hours.  ACTIVITY:  You should plan to take it easy for the rest of today and you should NOT DRIVE or use heavy machinery until tomorrow (because of the sedation medicines used during the test).    FOLLOW UP: Our staff will call the number listed on your records the next business day following your procedure to check on you and address any questions or concerns that you may have regarding the information given to you following your procedure. If we do not reach you, we will leave a message.  However, if you are feeling well and you are not experiencing any problems, there is no need to return our call.  We will assume that you have returned to your regular daily activities without incident.  If any biopsies were taken you will be contacted by phone or by letter within the next 1-3 weeks.  Please call us at [phone] if you have not heard about the biopsies in 3 weeks.  SIGNATURES/CONFIDENTIALITY: You and/or your care partner have signed paperwork which will be entered into your electronic medical record.  These signatures attest to the fact that that the information above on your After Visit Summary has been reviewed and is understood.  Full responsibility of the confidentiality of this discharge information lies with you and/or your care-partner.

## 2017-08-04 ENCOUNTER — Telehealth: Payer: Self-pay | Admitting: *Deleted

## 2017-08-04 NOTE — Telephone Encounter (Signed)
No answer for post procedure call back. Left message and will attempt to call back later this afternoon. SM 

## 2017-08-04 NOTE — Telephone Encounter (Signed)
Patient says that she is not having any problems and is feeling fine.

## 2017-08-04 NOTE — Telephone Encounter (Signed)
  Follow up Call-  Call back number 08/03/2017  Post procedure Call Back phone  # [phone]  Permission to leave phone message No  Some recent data might be hidden     Patient questions:  Do you have a fever, pain , or abdominal swelling? No. Pain Score  0 *  Have you tolerated food without any problems? Yes.    Have you been able to return to your normal activities? Yes.    Do you have any questions about your discharge instructions: Diet   No. Medications  No. Follow up visit  No.  Do you have questions or concerns about your Care? No.  Actions: * If pain score is 4 or above: No action needed, pain <4.

## 2017-08-07 ENCOUNTER — Telehealth: Payer: Self-pay | Admitting: Family Medicine

## 2017-08-07 ENCOUNTER — Encounter: Payer: Self-pay | Admitting: Gastroenterology

## 2017-08-07 ENCOUNTER — Other Ambulatory Visit: Payer: Self-pay

## 2017-08-07 MED ORDER — TRAZODONE HCL 100 MG PO TABS: 100.0000 mg | ORAL_TABLET | Freq: Every day | ORAL | 0 refills | Status: DC

## 2017-08-07 NOTE — Telephone Encounter (Signed)
Refill has been sent to pt pharmacy 

## 2017-08-07 NOTE — Telephone Encounter (Signed)
Pt is in the office today requesting a refill on Trazodone 100 MG  Pharm: [facility] [address]

## 2017-08-15 ENCOUNTER — Other Ambulatory Visit: Payer: Self-pay | Admitting: Physician Assistant

## 2017-08-19 ENCOUNTER — Ambulatory Visit: Payer: Medicare Other | Admitting: Gastroenterology

## 2017-08-28 ENCOUNTER — Ambulatory Visit: Payer: Medicare Other | Admitting: Family Medicine

## 2017-09-01 ENCOUNTER — Other Ambulatory Visit: Payer: Self-pay | Admitting: Family Medicine

## 2017-09-01 DIAGNOSIS — R1084 Generalized abdominal pain: Secondary | ICD-10-CM

## 2017-09-23 ENCOUNTER — Ambulatory Visit: Payer: Medicare Other | Admitting: Family Medicine

## 2017-09-25 ENCOUNTER — Other Ambulatory Visit: Payer: Self-pay | Admitting: Family Medicine

## 2017-09-30 ENCOUNTER — Encounter: Payer: Self-pay | Admitting: Family Medicine

## 2017-09-30 ENCOUNTER — Ambulatory Visit (INDEPENDENT_AMBULATORY_CARE_PROVIDER_SITE_OTHER): Payer: Medicare Other | Admitting: Family Medicine

## 2017-09-30 VITALS — BP 110/66 | HR 86 | Temp 98.0°F | Wt 105.4 lb

## 2017-09-30 DIAGNOSIS — F339 Major depressive disorder, recurrent, unspecified: Secondary | ICD-10-CM

## 2017-09-30 MED ORDER — ESCITALOPRAM OXALATE 10 MG PO TABS: ORAL_TABLET | ORAL | 4 refills | Status: DC

## 2017-09-30 MED ORDER — LORAZEPAM 0.5 MG PO TABS: ORAL_TABLET | ORAL | 4 refills | Status: DC

## 2017-09-30 NOTE — Progress Notes (Signed)
[name] is a 67 year old married female nonsmoker who comes in today for evaluation of anxiety and depression  She has a long-standing history of depression that was triggered by the death of her son 5 years ago. Since that time she's been on 10 mg of Lexapro at bedtime and 100 mg of trazodone at bedtime.  .her husband retired 6 months ago and since then he's been sick with no diagnosis present weight loss syncope has seen numerous physicians and they've not come up with a diagnosis. Because of this her anxiety is obviously increased..  Vs BP 110/66 (BP Location: Left Arm, Patient Position: Sitting, Cuff Size: Normal)   Pulse 86   Temp 98 F (36.7 C) (Oral)   Wt 105 lb 6 oz (47.8 kg)   SpO2 94%   BMI 19.91 kg/m  Well-developed well-nourished female no acute distress vital signs stable she's afebrile. She is appropriate has not have a depressed affect  #1 anxiety and depression............. Increase Lexapro to 15 mg daily continue trazodone and Ativan 0.5 daily at bedtime...........consult with mental health  Hypertension........ BP too low.......Marland Kitchen Decrease Norvasc to 5 mg daily

## 2017-09-30 NOTE — Patient Instructions (Signed)
Increase the Lexapro to 15 mg at bedtime  Ativan 0.5.............Marland Kitchen 1 at bedtime when necessary  Call the number on the pressure to get set up to see one of our people, to help you work through this anxiety

## 2017-10-28 ENCOUNTER — Other Ambulatory Visit: Payer: Self-pay | Admitting: Family Medicine

## 2017-11-05 ENCOUNTER — Ambulatory Visit (INDEPENDENT_AMBULATORY_CARE_PROVIDER_SITE_OTHER): Payer: Medicare Other | Admitting: *Deleted

## 2017-11-05 DIAGNOSIS — Z23 Encounter for immunization: Secondary | ICD-10-CM | POA: Diagnosis not present

## 2017-11-27 ENCOUNTER — Other Ambulatory Visit: Payer: Self-pay | Admitting: Family Medicine

## 2017-11-28 ENCOUNTER — Other Ambulatory Visit: Payer: Self-pay | Admitting: Family Medicine

## 2017-12-25 ENCOUNTER — Ambulatory Visit (INDEPENDENT_AMBULATORY_CARE_PROVIDER_SITE_OTHER): Payer: Medicare Other | Admitting: Internal Medicine

## 2017-12-25 ENCOUNTER — Encounter: Payer: Self-pay | Admitting: Internal Medicine

## 2017-12-25 VITALS — BP 110/70 | HR 72 | Temp 98.4°F | Wt 102.5 lb

## 2017-12-25 DIAGNOSIS — F339 Major depressive disorder, recurrent, unspecified: Secondary | ICD-10-CM | POA: Diagnosis not present

## 2017-12-25 DIAGNOSIS — Z13820 Encounter for screening for osteoporosis: Secondary | ICD-10-CM | POA: Diagnosis not present

## 2017-12-25 DIAGNOSIS — Z23 Encounter for immunization: Secondary | ICD-10-CM | POA: Diagnosis not present

## 2017-12-25 DIAGNOSIS — Z780 Asymptomatic menopausal state: Secondary | ICD-10-CM

## 2017-12-25 DIAGNOSIS — I10 Essential (primary) hypertension: Secondary | ICD-10-CM

## 2017-12-25 NOTE — Patient Instructions (Signed)
-  It was nice meeting you today.  -Please schedule an appointment with [name].  -Please see me back in 4 months for annual physical, please come fasting at that time.
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
## 2017-12-25 NOTE — Addendum Note (Signed)
Addended by: Westley Hummer B on: 12/25/2017 09:02 AM   Modules accepted: Orders

## 2018-04-07 ENCOUNTER — Other Ambulatory Visit: Payer: Self-pay | Admitting: *Deleted

## 2018-04-07 MED ORDER — LOSARTAN POTASSIUM 50 MG PO TABS: 50.0000 mg | ORAL_TABLET | Freq: Every day | ORAL | 1 refills | Status: DC

## 2018-05-25 ENCOUNTER — Other Ambulatory Visit: Payer: Self-pay | Admitting: *Deleted

## 2018-05-25 DIAGNOSIS — R1084 Generalized abdominal pain: Secondary | ICD-10-CM

## 2018-05-25 MED ORDER — PANTOPRAZOLE SODIUM 20 MG PO TBEC: 20.0000 mg | DELAYED_RELEASE_TABLET | Freq: Every day | ORAL | 1 refills | Status: DC

## 2018-05-27 ENCOUNTER — Other Ambulatory Visit: Payer: Self-pay | Admitting: Internal Medicine

## 2018-05-27 ENCOUNTER — Telehealth: Payer: Self-pay | Admitting: Internal Medicine

## 2018-05-27 DIAGNOSIS — F339 Major depressive disorder, recurrent, unspecified: Secondary | ICD-10-CM

## 2018-05-27 NOTE — Telephone Encounter (Signed)
Wrong provider I sent a message to her PCP.  Deaveon Schoen,cma

## 2018-05-27 NOTE — Telephone Encounter (Signed)
Attempted to call patient, but unable to leave a message.  Will try again at another time.

## 2018-05-27 NOTE — Telephone Encounter (Signed)
Copied from [place] [id number]. Topic: Quick Communication - Rx Refill/Question >> May 27, 2018 10:27 AM Antonieta Iba C wrote: Medication: traZODone (DESYREL) 100 MG tablet  Has the patient contacted their pharmacy? Yes  (Agent: If no, request that the patient contact the pharmacy for the refill.) (Agent: If yes, when and what did the pharmacy advise?)  Preferred Pharmacy (with phone number or street name): [id number] [place] [place] - [address] [phone] (Phone) [phone] (Fax)    Agent: Please be advised that RX refills may take up to 3 business days. We ask that you follow-up with your pharmacy.

## 2018-05-27 NOTE — Telephone Encounter (Signed)
Let's schedule e visit to discuss trazodone refills. I have not seen her since December.

## 2018-07-12 DIAGNOSIS — I10 Essential (primary) hypertension: Secondary | ICD-10-CM | POA: Diagnosis not present

## 2018-07-12 DIAGNOSIS — W57XXXA Bitten or stung by nonvenomous insect and other nonvenomous arthropods, initial encounter: Secondary | ICD-10-CM | POA: Diagnosis not present

## 2018-07-12 DIAGNOSIS — S80869A Insect bite (nonvenomous), unspecified lower leg, initial encounter: Secondary | ICD-10-CM | POA: Diagnosis not present

## 2018-11-02 ENCOUNTER — Encounter: Payer: Self-pay | Admitting: Gastroenterology

## 2018-11-11 ENCOUNTER — Ambulatory Visit (INDEPENDENT_AMBULATORY_CARE_PROVIDER_SITE_OTHER): Payer: Medicare Other | Admitting: Gastroenterology

## 2018-11-11 ENCOUNTER — Other Ambulatory Visit: Payer: Self-pay

## 2018-11-11 ENCOUNTER — Encounter: Payer: Self-pay | Admitting: Gastroenterology

## 2018-11-11 VITALS — BP 142/90 | HR 83 | Temp 97.9°F | Ht 59.0 in | Wt 98.4 lb

## 2018-11-11 DIAGNOSIS — K580 Irritable bowel syndrome with diarrhea: Secondary | ICD-10-CM

## 2018-11-11 DIAGNOSIS — R634 Abnormal weight loss: Secondary | ICD-10-CM

## 2018-11-11 DIAGNOSIS — R1084 Generalized abdominal pain: Secondary | ICD-10-CM | POA: Diagnosis not present

## 2018-11-11 MED ORDER — DIPHENOXYLATE-ATROPINE 2.5-0.025 MG PO TABS: 1.0000 | ORAL_TABLET | Freq: Two times a day (BID) | ORAL | 2 refills | Status: DC | PRN

## 2018-11-11 MED ORDER — MIRTAZAPINE 15 MG PO TABS: ORAL_TABLET | ORAL | 6 refills | Status: DC
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
## 2018-11-11 NOTE — Patient Instructions (Signed)
If you are age 67 or older, your body mass index should be between 23-30. Your Body mass index is 19.87 kg/m. If this is out of the aforementioned range listed, please consider follow up with your Primary Care Provider.  If you are age 76 or younger, your body mass index should be between 19-25. Your Body mass index is 19.87 kg/m. If this is out of the aformentioned range listed, please consider follow up with your Primary Care Provider.   Please go to the lab at [facility] ([place] You will need to go to level "B", you do not need an appointment for this. Hours available are 7:30 am - 4:30 pm. You will need to have your blood work completed before the day of your CT Scan or your CT Scan will be canceled.  We have sent the following medications to your pharmacy for you to pick up at your convenience: Remeron  Stop Lexapro.  You have been scheduled [facility] CT [facility] [place] [address] [address] [facility]).   You are scheduled on 11/19/18 at [place] should arrive 15 minutes prior to your appointment time for registration. Please follow the written instructions below on the day of your exam:  WARNING: IF YOU ARE ALLERGIC TO IODINE/X-RAY DYE, PLEASE NOTIFY RADIOLOGY IMMEDIATELY AT [phone]! YOU WILL BE GIVEN A 13 HOUR PREMEDICATION PREP.  1) Do not eat or drink anything after 5am (4 hours prior to your test) 2) You have been given 2 bottles of oral contrast to drink. The solution may taste better if refrigerated, but do NOT add ice or any other liquid to this solution. Shake well before drinking.    Drink 1 bottle of contrast @ 7am (2 hours prior to your exam)  Drink 1 bottle of contrast @ 8am (1 hour prior to your exam)  You may take any medications as prescribed with a small amount of water, if necessary. If you take any of the following medications: METFORMIN, GLUCOPHAGE, GLUCOVANCE,  AVANDAMET, RIOMET, FORTAMET, [place] MET, JANUMET, GLUMETZA or METAGLIP, you MAY be asked to HOLD this medication 48 hours AFTER the exam.  The purpose of you drinking the oral contrast is to aid in the visualization of your intestinal tract. The contrast solution may cause some diarrhea. Depending on your individual set of symptoms, you may also receive an intravenous injection of x-ray contrast/dye. Plan on being at [facility] for 30 minutes or longer, depending on the type of exam you are having performed.  This test typically takes 30-45 minutes to complete.  If you have any questions regarding your exam or if you need to reschedule, you may call the CT department at [phone] between the hours of 8:00 am and 5:00 pm, Monday-Friday.  ________________________________________________________________________   Thank you,  Dr. Jackquline Denmark

## 2018-11-12 NOTE — Addendum Note (Signed)
Addended by: Karena Addison on: 11/12/2018 10:55 AM   Modules accepted: Orders

## 2018-11-16 ENCOUNTER — Other Ambulatory Visit (INDEPENDENT_AMBULATORY_CARE_PROVIDER_SITE_OTHER): Payer: Medicare Other

## 2018-11-16 DIAGNOSIS — K580 Irritable bowel syndrome with diarrhea: Secondary | ICD-10-CM

## 2018-11-16 DIAGNOSIS — R634 Abnormal weight loss: Secondary | ICD-10-CM

## 2018-11-16 DIAGNOSIS — R1084 Generalized abdominal pain: Secondary | ICD-10-CM

## 2018-11-16 LAB — COMPREHENSIVE METABOLIC PANEL
ALT: 14 U/L (ref 0–35)
AST: 29 U/L (ref 0–37)
Albumin: 3.7 g/dL (ref 3.5–5.2)
Alkaline Phosphatase: 59 U/L (ref 39–117)
BUN: 10 mg/dL (ref 6–23)
CO2: 28 mEq/L (ref 19–32)
Calcium: 8.8 mg/dL (ref 8.4–10.5)
Chloride: 108 mEq/L (ref 96–112)
Creatinine, Ser: 0.85 mg/dL (ref 0.40–1.20)
GFR: 80.72 mL/min (ref >60.00)
Glucose, Bld: 80 mg/dL (ref 70–99)
Potassium: 3.6 mEq/L (ref 3.5–5.1)
Sodium: 143 mEq/L (ref 135–145)
Total Bilirubin: 0.3 mg/dL (ref 0.2–1.2)
Total Protein: 6.7 g/dL (ref 6.0–8.3)

## 2018-11-16 LAB — CBC WITH DIFFERENTIAL/PLATELET
Basophils Absolute: 0.1 10*3/uL (ref 0.0–0.1)
Basophils Relative: 0.8 % (ref 0.0–3.0)
Eosinophils Absolute: 0.1 10*3/uL (ref 0.0–0.7)
Eosinophils Relative: 1.5 % (ref 0.0–5.0)
HCT: 33.2 % — ABNORMAL LOW (ref 36.0–46.0)
Hemoglobin: 11.1 g/dL — ABNORMAL LOW (ref 12.0–15.0)
Lymphocytes Relative: 38.5 % (ref 12.0–46.0)
Lymphs Abs: 2.3 10*3/uL (ref 0.7–4.0)
MCHC: 33.4 g/dL (ref 30.0–36.0)
MCV: 107.9 fl — ABNORMAL HIGH (ref 78.0–100.0)
Monocytes Absolute: 0.5 10*3/uL (ref 0.1–1.0)
Monocytes Relative: 9.1 % (ref 3.0–12.0)
Neutro Abs: 3 10*3/uL (ref 1.4–7.7)
Neutrophils Relative %: 50.1 % (ref 43.0–77.0)
Platelets: 303 10*3/uL (ref 150.0–400.0)
RBC: 3.08 Mil/uL — ABNORMAL LOW (ref 3.87–5.11)
RDW: 14.1 % (ref 11.5–15.5)
WBC: 5.9 10*3/uL (ref 4.0–10.5)

## 2018-11-16 LAB — C-REACTIVE PROTEIN: CRP: <1 mg/dL (ref 0.5–20.0)

## 2018-11-16 LAB — LIPASE: Lipase: 11 U/L (ref 11.0–59.0)

## 2018-11-18 ENCOUNTER — Other Ambulatory Visit: Payer: Medicare Other

## 2018-11-18 DIAGNOSIS — R1084 Generalized abdominal pain: Secondary | ICD-10-CM | POA: Diagnosis not present

## 2018-11-18 DIAGNOSIS — R634 Abnormal weight loss: Secondary | ICD-10-CM | POA: Diagnosis not present

## 2018-11-18 DIAGNOSIS — K580 Irritable bowel syndrome with diarrhea: Secondary | ICD-10-CM

## 2018-11-18 LAB — TSH: TSH: 1.45 u[IU]/mL (ref 0.35–4.50)

## 2018-11-19 ENCOUNTER — Ambulatory Visit (HOSPITAL_BASED_OUTPATIENT_CLINIC_OR_DEPARTMENT_OTHER)
Admission: RE | Admit: 2018-11-19 | Discharge: 2018-11-19 | Disposition: A | Discharge status: Home / Self Care | Payer: Medicare Other | Source: Ambulatory Visit | Attending: Gastroenterology | Admitting: Gastroenterology

## 2018-11-19 ENCOUNTER — Other Ambulatory Visit: Payer: Self-pay

## 2018-11-19 ENCOUNTER — Telehealth: Payer: Self-pay | Admitting: Gastroenterology

## 2018-11-19 ENCOUNTER — Ambulatory Visit (HOSPITAL_BASED_OUTPATIENT_CLINIC_OR_DEPARTMENT_OTHER): Admission: RE | Admit: 2018-11-19 | Payer: Medicare Other | Source: Ambulatory Visit

## 2018-11-19 DIAGNOSIS — R634 Abnormal weight loss: Secondary | ICD-10-CM | POA: Diagnosis not present

## 2018-11-19 DIAGNOSIS — N281 Cyst of kidney, acquired: Secondary | ICD-10-CM | POA: Diagnosis not present

## 2018-11-19 DIAGNOSIS — K580 Irritable bowel syndrome with diarrhea: Secondary | ICD-10-CM | POA: Insufficient documentation

## 2018-11-19 DIAGNOSIS — R1084 Generalized abdominal pain: Secondary | ICD-10-CM | POA: Diagnosis not present

## 2018-11-19 DIAGNOSIS — R197 Diarrhea, unspecified: Secondary | ICD-10-CM | POA: Diagnosis not present

## 2018-11-19 MED ORDER — IOHEXOL 300 MG/ML  SOLN
100.0000 mL | Freq: Once | INTRAMUSCULAR | Status: AC | PRN
  Administered 2018-11-19: 80 mL via INTRAVENOUS

## 2018-11-20 LAB — TIQ-NTM

## 2018-11-22 ENCOUNTER — Telehealth: Payer: Self-pay | Admitting: Gastroenterology

## 2018-11-22 NOTE — Telephone Encounter (Signed)
Please see additional documentation

## 2018-11-22 NOTE — Telephone Encounter (Signed)
Pt returned your call, pls call her again.  °

## 2018-11-23 NOTE — Telephone Encounter (Signed)
Pt returning call would like to speak with you today.

## 2018-11-23 NOTE — Telephone Encounter (Signed)
Please see additional documentation concerning this patient 

## 2018-11-24 ENCOUNTER — Telehealth: Payer: Medicare Other | Admitting: Internal Medicine

## 2018-11-24 ENCOUNTER — Other Ambulatory Visit: Payer: Self-pay

## 2018-11-25 LAB — OVA AND PARASITE EXAMINATION
CONCENTRATE RESULT:: NONE SEEN
MICRO NUMBER:: 1044754
SPECIMEN QUALITY:: ADEQUATE
TRICHROME RESULT:: NONE SEEN

## 2018-11-25 LAB — GASTROINTESTINAL PATHOGEN PANEL PCR
C. difficile Tox A/B, PCR: NOT DETECTED
Campylobacter, PCR: NOT DETECTED
Cryptosporidium, PCR: NOT DETECTED
E coli (ETEC) LT/ST PCR: NOT DETECTED
E coli (STEC) stx1/stx2, PCR: NOT DETECTED
E coli 0157, PCR: NOT DETECTED
Giardia lamblia, PCR: NOT DETECTED
Norovirus, PCR: NOT DETECTED
Rotavirus A, PCR: NOT DETECTED
Salmonella, PCR: NOT DETECTED
Shigella, PCR: NOT DETECTED

## 2018-11-25 LAB — FECAL LACTOFERRIN, QUANT
Fecal Lactoferrin: NEGATIVE
MICRO NUMBER:: 1045061
SPECIMEN QUALITY:: ADEQUATE

## 2018-11-25 LAB — GIARDIA ANTIGEN
MICRO NUMBER:: 1044753
RESULT:: NOT DETECTED
SPECIMEN QUALITY:: ADEQUATE

## 2018-11-25 LAB — FECAL FAT, QUALITATIVE

## 2018-11-26 ENCOUNTER — Other Ambulatory Visit: Payer: Medicare Other

## 2018-11-26 ENCOUNTER — Other Ambulatory Visit: Payer: Self-pay

## 2018-11-26 DIAGNOSIS — R1084 Generalized abdominal pain: Secondary | ICD-10-CM

## 2018-11-26 DIAGNOSIS — R634 Abnormal weight loss: Secondary | ICD-10-CM

## 2018-11-26 DIAGNOSIS — D649 Anemia, unspecified: Secondary | ICD-10-CM

## 2018-11-26 DIAGNOSIS — R194 Change in bowel habit: Secondary | ICD-10-CM

## 2018-11-26 DIAGNOSIS — K921 Melena: Secondary | ICD-10-CM

## 2018-11-26 DIAGNOSIS — K580 Irritable bowel syndrome with diarrhea: Secondary | ICD-10-CM

## 2018-11-26 NOTE — Addendum Note (Signed)
Addended by: Mohammed Kindle on: 11/26/2018 11:45 AM   Modules accepted: Orders

## 2018-11-28 DIAGNOSIS — R0902 Hypoxemia: Secondary | ICD-10-CM | POA: Diagnosis not present

## 2018-11-28 DIAGNOSIS — R062 Wheezing: Secondary | ICD-10-CM | POA: Diagnosis not present

## 2018-11-28 DIAGNOSIS — R000 Tachycardia, unspecified: Secondary | ICD-10-CM | POA: Diagnosis not present

## 2018-11-28 DIAGNOSIS — Z20828 Contact with and (suspected) exposure to other viral communicable diseases: Secondary | ICD-10-CM | POA: Diagnosis not present

## 2018-11-28 DIAGNOSIS — J189 Pneumonia, unspecified organism: Secondary | ICD-10-CM | POA: Diagnosis not present

## 2018-11-28 DIAGNOSIS — R069 Unspecified abnormalities of breathing: Secondary | ICD-10-CM | POA: Diagnosis not present

## 2018-11-28 DIAGNOSIS — J441 Chronic obstructive pulmonary disease with (acute) exacerbation: Secondary | ICD-10-CM | POA: Diagnosis not present

## 2018-11-28 DIAGNOSIS — R0602 Shortness of breath: Secondary | ICD-10-CM | POA: Diagnosis not present

## 2018-11-29 ENCOUNTER — Emergency Department (HOSPITAL_COMMUNITY)
Admission: EM | Admit: 2018-11-29 | Discharge: 2018-11-29 | Disposition: A | Discharge status: Home / Self Care | Payer: Medicare Other | Attending: Emergency Medicine | Admitting: Emergency Medicine

## 2018-11-29 ENCOUNTER — Encounter (HOSPITAL_COMMUNITY): Payer: Self-pay | Admitting: Emergency Medicine

## 2018-11-29 ENCOUNTER — Emergency Department (HOSPITAL_COMMUNITY): Payer: Medicare Other

## 2018-11-29 ENCOUNTER — Other Ambulatory Visit: Payer: Self-pay

## 2018-11-29 DIAGNOSIS — Z79899 Other long term (current) drug therapy: Secondary | ICD-10-CM | POA: Insufficient documentation

## 2018-11-29 DIAGNOSIS — R059 Cough, unspecified: Secondary | ICD-10-CM

## 2018-11-29 DIAGNOSIS — J189 Pneumonia, unspecified organism: Secondary | ICD-10-CM | POA: Insufficient documentation

## 2018-11-29 DIAGNOSIS — R0602 Shortness of breath: Secondary | ICD-10-CM | POA: Diagnosis not present

## 2018-11-29 DIAGNOSIS — F17210 Nicotine dependence, cigarettes, uncomplicated: Secondary | ICD-10-CM | POA: Insufficient documentation

## 2018-11-29 DIAGNOSIS — J441 Chronic obstructive pulmonary disease with (acute) exacerbation: Secondary | ICD-10-CM | POA: Diagnosis not present

## 2018-11-29 DIAGNOSIS — R05 Cough: Secondary | ICD-10-CM | POA: Diagnosis not present

## 2018-11-29 DIAGNOSIS — R000 Tachycardia, unspecified: Secondary | ICD-10-CM | POA: Diagnosis not present

## 2018-11-29 DIAGNOSIS — Z20828 Contact with and (suspected) exposure to other viral communicable diseases: Secondary | ICD-10-CM | POA: Insufficient documentation

## 2018-11-29 DIAGNOSIS — I10 Essential (primary) hypertension: Secondary | ICD-10-CM | POA: Diagnosis not present

## 2018-11-29 LAB — CBC WITH DIFFERENTIAL/PLATELET
Abs Immature Granulocytes: 0.04 10*3/uL (ref 0.00–0.07)
Basophils Absolute: 0 10*3/uL (ref 0.0–0.1)
Basophils Relative: 0 %
Eosinophils Absolute: 0.1 10*3/uL (ref 0.0–0.5)
Eosinophils Relative: 1 %
HCT: 31.2 % — ABNORMAL LOW (ref 36.0–46.0)
Hemoglobin: 10.1 g/dL — ABNORMAL LOW (ref 12.0–15.0)
Immature Granulocytes: 0 %
Lymphocytes Relative: 15 %
Lymphs Abs: 1.4 10*3/uL (ref 0.7–4.0)
MCH: 35.8 pg — ABNORMAL HIGH (ref 26.0–34.0)
MCHC: 32.4 g/dL (ref 30.0–36.0)
MCV: 110.6 fL — ABNORMAL HIGH (ref 80.0–100.0)
Monocytes Absolute: 0.5 10*3/uL (ref 0.1–1.0)
Monocytes Relative: 5 %
Neutro Abs: 7.3 10*3/uL (ref 1.7–7.7)
Neutrophils Relative %: 79 %
Platelets: 262 10*3/uL (ref 150–400)
RBC: 2.82 MIL/uL — ABNORMAL LOW (ref 3.87–5.11)
RDW: 12.7 % (ref 11.5–15.5)
WBC: 9.4 10*3/uL (ref 4.0–10.5)
nRBC: 0 % (ref 0.0–0.2)

## 2018-11-29 LAB — POCT I-STAT 7, (LYTES, BLD GAS, ICA,H+H)
Bicarbonate: 23.9 mmol/L (ref 20.0–28.0)
Calcium, Ion: 1.14 mmol/L — ABNORMAL LOW (ref 1.15–1.40)
HCT: 31 % — ABNORMAL LOW (ref 36.0–46.0)
Hemoglobin: 10.5 g/dL — ABNORMAL LOW (ref 12.0–15.0)
O2 Saturation: 94 %
Patient temperature: 98.9
Potassium: 3 mmol/L — ABNORMAL LOW (ref 3.5–5.1)
Sodium: 135 mmol/L (ref 135–145)
TCO2: 25 mmol/L (ref 22–32)
pCO2 arterial: 35.7 mmHg (ref 32.0–48.0)
pH, Arterial: 7.435 (ref 7.350–7.450)
pO2, Arterial: 68 mmHg — ABNORMAL LOW (ref 83.0–108.0)

## 2018-11-29 LAB — BASIC METABOLIC PANEL
Anion gap: 13 (ref 5–15)
BUN: 9 mg/dL (ref 8–23)
CO2: 23 mmol/L (ref 22–32)
Calcium: 8.5 mg/dL — ABNORMAL LOW (ref 8.9–10.3)
Chloride: 98 mmol/L (ref 98–111)
Creatinine, Ser: 0.94 mg/dL (ref 0.44–1.00)
GFR calc Af Amer: >60 mL/min (ref >60)
GFR calc non Af Amer: >60 mL/min (ref >60)
Glucose, Bld: 139 mg/dL — ABNORMAL HIGH (ref 70–99)
Potassium: 3.4 mmol/L — ABNORMAL LOW (ref 3.5–5.1)
Sodium: 134 mmol/L — ABNORMAL LOW (ref 135–145)

## 2018-11-29 LAB — SARS CORONAVIRUS 2 (TAT 6-24 HRS): SARS Coronavirus 2: NEGATIVE

## 2018-11-29 LAB — LACTIC ACID, PLASMA: Lactic Acid, Venous: 2.4 mmol/L (ref 0.5–1.9)

## 2018-11-29 MED ORDER — AEROCHAMBER PLUS FLO-VU LARGE MISC
Status: AC
  Administered 2018-11-29: 01:00:00
  Filled 2018-11-29: qty 1

## 2018-11-29 MED ORDER — SODIUM CHLORIDE 0.9 % IV SOLN
1.0000 g | Freq: Once | INTRAVENOUS | Status: AC
  Administered 2018-11-29: 03:00:00 1 g via INTRAVENOUS
  Filled 2018-11-29: qty 10

## 2018-11-29 MED ORDER — IPRATROPIUM BROMIDE HFA 17 MCG/ACT IN AERS
2.0000 | INHALATION_SPRAY | Freq: Once | RESPIRATORY_TRACT | Status: AC
  Administered 2018-11-29: 03:00:00 2 via RESPIRATORY_TRACT
  Filled 2018-11-29: qty 12.9

## 2018-11-29 MED ORDER — SODIUM CHLORIDE 0.9 % IV SOLN
500.0000 mg | Freq: Once | INTRAVENOUS | Status: AC
  Administered 2018-11-29: 500 mg via INTRAVENOUS
  Filled 2018-11-29: qty 500

## 2018-11-29 MED ORDER — DOXYCYCLINE HYCLATE 100 MG PO CAPS: 100.0000 mg | ORAL_CAPSULE | Freq: Two times a day (BID) | ORAL | 0 refills | Status: AC

## 2018-11-29 MED ORDER — PREDNISONE 10 MG PO TABS: 40.0000 mg | ORAL_TABLET | Freq: Every day | ORAL | 0 refills | Status: AC

## 2018-11-29 MED ORDER — ALBUTEROL SULFATE HFA 108 (90 BASE) MCG/ACT IN AERS
6.0000 | INHALATION_SPRAY | Freq: Once | RESPIRATORY_TRACT | Status: AC
  Administered 2018-11-29: 6 via RESPIRATORY_TRACT
  Filled 2018-11-29: qty 6.7

## 2018-11-29 NOTE — ED Notes (Signed)
Patient ambulated hallway with no difficulty , O2 sat maintained at 94% room air.
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
## 2018-11-29 NOTE — ED Triage Notes (Signed)
Patient arrived with EMS from home reports SOB , chest congestion and occasional productive cough onset this week , no fever or chills , she received Solumedrol 125 mg IV and Albuterol 5 mg nebulizer by EMS with relief .

## 2018-11-29 NOTE — ED Notes (Signed)
Pharmacy notified on patient's Atrovent inhaler order .

## 2018-11-29 NOTE — ED Notes (Signed)
Pt ambulatory to restroom. On the way from bathroom, pt laid head on the desk stating she felt lightheaded, increased sob, speaking in short sentences. Taken to resus room via wheelchair, pulse ox reading 85%, tachycardic at 130s. Oxygen placed back on patient

## 2018-11-29 NOTE — Discharge Instructions (Signed)
Take 4 puffs of albuterol every 4 hours for the next 24 hours. Tomorrow, you can take 2-4 puffs every 4-6 hours as needed for wheezing or shortness of breath.

## 2018-12-02 ENCOUNTER — Telehealth: Payer: Self-pay | Admitting: Gastroenterology

## 2018-12-02 DIAGNOSIS — K580 Irritable bowel syndrome with diarrhea: Secondary | ICD-10-CM

## 2018-12-02 DIAGNOSIS — R1084 Generalized abdominal pain: Secondary | ICD-10-CM

## 2018-12-02 DIAGNOSIS — F339 Major depressive disorder, recurrent, unspecified: Secondary | ICD-10-CM

## 2018-12-02 MED ORDER — MIRTAZAPINE 15 MG PO TABS: 15.0000 mg | ORAL_TABLET | Freq: Every day | ORAL | 11 refills | Status: DC

## 2018-12-02 NOTE — Telephone Encounter (Signed)
Called and spoke with patient-patient informed that RX is being sent in to her pharmacy-patient verified pharmacy of choice-patient reports she will submit the stool sample as soon as she is able; Patient advised to call back to the office at [phone] should questions/concerns arise;  Patient verbalized understanding of information/instructions;

## 2018-12-02 NOTE — Telephone Encounter (Signed)
Please do Remeron 15 mg p.o. nightly, 30, 11 refills RG

## 2018-12-02 NOTE — Telephone Encounter (Signed)
Called and spoke with patient-patient reports she has pneumonia and will need to postpone her stool sample being sent in; patient informed as soon as she is feeling better she can submit the sample to the [facility] office;   Side note: Patient is requesting to have a refill on there Remeron as she has been taking 1 tablet at night-as advised -  please advise

## 2018-12-03 NOTE — Telephone Encounter (Signed)
Pt state she has other questions best call back [phone].

## 2018-12-14 DIAGNOSIS — R0602 Shortness of breath: Secondary | ICD-10-CM | POA: Diagnosis not present

## 2018-12-14 DIAGNOSIS — Z1159 Encounter for screening for other viral diseases: Secondary | ICD-10-CM | POA: Diagnosis not present

## 2018-12-14 DIAGNOSIS — I10 Essential (primary) hypertension: Secondary | ICD-10-CM | POA: Diagnosis not present

## 2018-12-25 ENCOUNTER — Other Ambulatory Visit: Payer: Self-pay

## 2018-12-25 ENCOUNTER — Emergency Department (HOSPITAL_COMMUNITY): Payer: Medicare Other

## 2018-12-25 ENCOUNTER — Inpatient Hospital Stay (HOSPITAL_COMMUNITY)
Admission: EM | Admit: 2018-12-25 | Discharge: 2018-12-30 | DRG: 291 | Disposition: A | Discharge status: Home / Self Care | Payer: Medicare Other | Attending: Internal Medicine | Admitting: Internal Medicine

## 2018-12-25 ENCOUNTER — Encounter (HOSPITAL_COMMUNITY): Payer: Self-pay

## 2018-12-25 DIAGNOSIS — J9620 Acute and chronic respiratory failure, unspecified whether with hypoxia or hypercapnia: Secondary | ICD-10-CM | POA: Diagnosis present

## 2018-12-25 DIAGNOSIS — I5041 Acute combined systolic (congestive) and diastolic (congestive) heart failure: Secondary | ICD-10-CM | POA: Diagnosis present

## 2018-12-25 DIAGNOSIS — E785 Hyperlipidemia, unspecified: Secondary | ICD-10-CM | POA: Diagnosis present

## 2018-12-25 DIAGNOSIS — R079 Chest pain, unspecified: Secondary | ICD-10-CM | POA: Diagnosis not present

## 2018-12-25 DIAGNOSIS — R0902 Hypoxemia: Secondary | ICD-10-CM

## 2018-12-25 DIAGNOSIS — I43 Cardiomyopathy in diseases classified elsewhere: Secondary | ICD-10-CM | POA: Diagnosis present

## 2018-12-25 DIAGNOSIS — F329 Major depressive disorder, single episode, unspecified: Secondary | ICD-10-CM | POA: Diagnosis present

## 2018-12-25 DIAGNOSIS — I340 Nonrheumatic mitral (valve) insufficiency: Secondary | ICD-10-CM

## 2018-12-25 DIAGNOSIS — I313 Pericardial effusion (noninflammatory): Secondary | ICD-10-CM | POA: Diagnosis present

## 2018-12-25 DIAGNOSIS — J9601 Acute respiratory failure with hypoxia: Secondary | ICD-10-CM | POA: Diagnosis not present

## 2018-12-25 DIAGNOSIS — I5021 Acute systolic (congestive) heart failure: Secondary | ICD-10-CM | POA: Diagnosis not present

## 2018-12-25 DIAGNOSIS — E872 Acidosis: Secondary | ICD-10-CM | POA: Diagnosis present

## 2018-12-25 DIAGNOSIS — R0689 Other abnormalities of breathing: Secondary | ICD-10-CM | POA: Diagnosis not present

## 2018-12-25 DIAGNOSIS — J80 Acute respiratory distress syndrome: Secondary | ICD-10-CM | POA: Diagnosis not present

## 2018-12-25 DIAGNOSIS — R0603 Acute respiratory distress: Secondary | ICD-10-CM | POA: Diagnosis not present

## 2018-12-25 DIAGNOSIS — N1831 Chronic kidney disease, stage 3a: Secondary | ICD-10-CM | POA: Diagnosis present

## 2018-12-25 DIAGNOSIS — F17210 Nicotine dependence, cigarettes, uncomplicated: Secondary | ICD-10-CM | POA: Diagnosis present

## 2018-12-25 DIAGNOSIS — I361 Nonrheumatic tricuspid (valve) insufficiency: Secondary | ICD-10-CM

## 2018-12-25 DIAGNOSIS — N179 Acute kidney failure, unspecified: Secondary | ICD-10-CM | POA: Diagnosis present

## 2018-12-25 DIAGNOSIS — I959 Hypotension, unspecified: Secondary | ICD-10-CM | POA: Diagnosis present

## 2018-12-25 DIAGNOSIS — I110 Hypertensive heart disease with heart failure: Secondary | ICD-10-CM | POA: Diagnosis not present

## 2018-12-25 DIAGNOSIS — I509 Heart failure, unspecified: Secondary | ICD-10-CM

## 2018-12-25 DIAGNOSIS — I5033 Acute on chronic diastolic (congestive) heart failure: Secondary | ICD-10-CM | POA: Diagnosis not present

## 2018-12-25 DIAGNOSIS — R0602 Shortness of breath: Secondary | ICD-10-CM | POA: Diagnosis not present

## 2018-12-25 DIAGNOSIS — Z20828 Contact with and (suspected) exposure to other viral communicable diseases: Secondary | ICD-10-CM | POA: Diagnosis present

## 2018-12-25 DIAGNOSIS — Z23 Encounter for immunization: Secondary | ICD-10-CM

## 2018-12-25 DIAGNOSIS — G894 Chronic pain syndrome: Secondary | ICD-10-CM | POA: Diagnosis present

## 2018-12-25 DIAGNOSIS — Z79899 Other long term (current) drug therapy: Secondary | ICD-10-CM

## 2018-12-25 DIAGNOSIS — I130 Hypertensive heart and chronic kidney disease with heart failure and stage 1 through stage 4 chronic kidney disease, or unspecified chronic kidney disease: Principal | ICD-10-CM | POA: Diagnosis present

## 2018-12-25 DIAGNOSIS — K219 Gastro-esophageal reflux disease without esophagitis: Secondary | ICD-10-CM | POA: Diagnosis present

## 2018-12-25 DIAGNOSIS — Z90710 Acquired absence of both cervix and uterus: Secondary | ICD-10-CM | POA: Diagnosis not present

## 2018-12-25 DIAGNOSIS — I10 Essential (primary) hypertension: Secondary | ICD-10-CM | POA: Diagnosis present

## 2018-12-25 DIAGNOSIS — F410 Panic disorder [episodic paroxysmal anxiety] without agoraphobia: Secondary | ICD-10-CM | POA: Diagnosis present

## 2018-12-25 DIAGNOSIS — J9621 Acute and chronic respiratory failure with hypoxia: Secondary | ICD-10-CM | POA: Diagnosis present

## 2018-12-25 DIAGNOSIS — F339 Major depressive disorder, recurrent, unspecified: Secondary | ICD-10-CM | POA: Diagnosis present

## 2018-12-25 DIAGNOSIS — N171 Acute kidney failure with acute cortical necrosis: Secondary | ICD-10-CM | POA: Diagnosis not present

## 2018-12-25 DIAGNOSIS — E1165 Type 2 diabetes mellitus with hyperglycemia: Secondary | ICD-10-CM | POA: Diagnosis not present

## 2018-12-25 DIAGNOSIS — R069 Unspecified abnormalities of breathing: Secondary | ICD-10-CM | POA: Diagnosis not present

## 2018-12-25 DIAGNOSIS — I209 Angina pectoris, unspecified: Secondary | ICD-10-CM | POA: Diagnosis not present

## 2018-12-25 DIAGNOSIS — E876 Hypokalemia: Secondary | ICD-10-CM | POA: Diagnosis present

## 2018-12-25 LAB — COMPREHENSIVE METABOLIC PANEL
ALT: 17 U/L (ref 0–44)
AST: 28 U/L (ref 15–41)
Albumin: 3.8 g/dL (ref 3.5–5.0)
Alkaline Phosphatase: 69 U/L (ref 38–126)
Anion gap: 15 (ref 5–15)
BUN: 24 mg/dL — ABNORMAL HIGH (ref 8–23)
CO2: 19 mmol/L — ABNORMAL LOW (ref 22–32)
Calcium: 9.5 mg/dL (ref 8.9–10.3)
Chloride: 99 mmol/L (ref 98–111)
Creatinine, Ser: 1.32 mg/dL — ABNORMAL HIGH (ref 0.44–1.00)
GFR calc Af Amer: 48 mL/min — ABNORMAL LOW (ref >60)
GFR calc non Af Amer: 42 mL/min — ABNORMAL LOW (ref >60)
Glucose, Bld: 328 mg/dL — ABNORMAL HIGH (ref 70–99)
Potassium: 3.1 mmol/L — ABNORMAL LOW (ref 3.5–5.1)
Sodium: 133 mmol/L — ABNORMAL LOW (ref 135–145)
Total Bilirubin: 0.5 mg/dL (ref 0.3–1.2)
Total Protein: 7.5 g/dL (ref 6.5–8.1)

## 2018-12-25 LAB — CBC WITH DIFFERENTIAL/PLATELET
Abs Immature Granulocytes: 0.05 10*3/uL (ref 0.00–0.07)
Basophils Absolute: 0 10*3/uL (ref 0.0–0.1)
Basophils Relative: 0 %
Eosinophils Absolute: 0 10*3/uL (ref 0.0–0.5)
Eosinophils Relative: 0 %
HCT: 39.2 % (ref 36.0–46.0)
Hemoglobin: 12.9 g/dL (ref 12.0–15.0)
Immature Granulocytes: 0 %
Lymphocytes Relative: 17 %
Lymphs Abs: 2 10*3/uL (ref 0.7–4.0)
MCH: 34 pg (ref 26.0–34.0)
MCHC: 32.9 g/dL (ref 30.0–36.0)
MCV: 103.4 fL — ABNORMAL HIGH (ref 80.0–100.0)
Monocytes Absolute: 0.4 10*3/uL (ref 0.1–1.0)
Monocytes Relative: 3 %
Neutro Abs: 9.1 10*3/uL — ABNORMAL HIGH (ref 1.7–7.7)
Neutrophils Relative %: 80 %
Platelets: 357 10*3/uL (ref 150–400)
RBC: 3.79 MIL/uL — ABNORMAL LOW (ref 3.87–5.11)
RDW: 12.4 % (ref 11.5–15.5)
WBC: 11.5 10*3/uL — ABNORMAL HIGH (ref 4.0–10.5)
nRBC: 0 % (ref 0.0–0.2)

## 2018-12-25 LAB — TROPONIN I (HIGH SENSITIVITY)
Troponin I (High Sensitivity): 13 ng/L (ref <18)
Troponin I (High Sensitivity): 39 ng/L — ABNORMAL HIGH (ref <18)

## 2018-12-25 LAB — HIV ANTIBODY (ROUTINE TESTING W REFLEX): HIV Screen 4th Generation wRfx: NONREACTIVE

## 2018-12-25 LAB — POC SARS CORONAVIRUS 2 AG -  ED: SARS Coronavirus 2 Ag: NEGATIVE

## 2018-12-25 LAB — LACTIC ACID, PLASMA
Lactic Acid, Venous: 4.1 mmol/L (ref 0.5–1.9)
Lactic Acid, Venous: 2.9 mmol/L (ref 0.5–1.9)
Lactic Acid, Venous: 3.6 mmol/L (ref 0.5–1.9)

## 2018-12-25 LAB — SARS CORONAVIRUS 2 (TAT 6-24 HRS): SARS Coronavirus 2: NEGATIVE

## 2018-12-25 LAB — ECHOCARDIOGRAM COMPLETE

## 2018-12-25 LAB — CK: Total CK: 64 U/L (ref 38–234)

## 2018-12-25 LAB — BRAIN NATRIURETIC PEPTIDE: B Natriuretic Peptide: 2159 pg/mL — ABNORMAL HIGH (ref 0.0–100.0)

## 2018-12-25 MED ORDER — ALBUTEROL SULFATE (2.5 MG/3ML) 0.083% IN NEBU: 5.0000 mg | INHALATION_SOLUTION | Freq: Once | RESPIRATORY_TRACT | Status: DC

## 2018-12-25 MED ORDER — PANTOPRAZOLE SODIUM 20 MG PO TBEC
20.0000 mg | DELAYED_RELEASE_TABLET | Freq: Every day | ORAL | Status: DC
  Administered 2018-12-26 – 2018-12-30 (×5): 20 mg via ORAL
  Filled 2018-12-25 (×5): qty 1

## 2018-12-25 MED ORDER — METOPROLOL TARTRATE 5 MG/5ML IV SOLN
5.0000 mg | Freq: Four times a day (QID) | INTRAVENOUS | Status: DC | PRN
  Administered 2018-12-25: 5 mg via INTRAVENOUS
  Filled 2018-12-25: qty 5

## 2018-12-25 MED ORDER — POTASSIUM CHLORIDE 10 MEQ/100ML IV SOLN
10.0000 meq | INTRAVENOUS | Status: AC
  Administered 2018-12-25 (×4): 10 meq via INTRAVENOUS
  Filled 2018-12-25 (×4): qty 100

## 2018-12-25 MED ORDER — NITROGLYCERIN IN D5W 200-5 MCG/ML-% IV SOLN
2.0000 ug/min | INTRAVENOUS | Status: DC
  Administered 2018-12-25: 5 ug/min via INTRAVENOUS
  Filled 2018-12-25: qty 250

## 2018-12-25 MED ORDER — MIRTAZAPINE 15 MG PO TABS
15.0000 mg | ORAL_TABLET | Freq: Every day | ORAL | Status: DC
  Administered 2018-12-25 – 2018-12-29 (×5): 15 mg via ORAL
  Filled 2018-12-25 (×6): qty 1

## 2018-12-25 MED ORDER — DIPHENOXYLATE-ATROPINE 2.5-0.025 MG PO TABS: 1.0000 | ORAL_TABLET | Freq: Two times a day (BID) | ORAL | Status: DC | PRN

## 2018-12-25 MED ORDER — ONDANSETRON HCL 4 MG/2ML IJ SOLN: 4.0000 mg | Freq: Four times a day (QID) | INTRAMUSCULAR | Status: DC | PRN

## 2018-12-25 MED ORDER — ATORVASTATIN CALCIUM 40 MG PO TABS
40.0000 mg | ORAL_TABLET | Freq: Every day | ORAL | Status: DC
  Administered 2018-12-26 – 2018-12-29 (×4): 40 mg via ORAL
  Filled 2018-12-25 (×4): qty 1

## 2018-12-25 MED ORDER — SODIUM CHLORIDE 0.9% FLUSH: 3.0000 mL | INTRAVENOUS | Status: DC | PRN

## 2018-12-25 MED ORDER — ALBUTEROL (5 MG/ML) CONTINUOUS INHALATION SOLN
10.0000 mg/h | INHALATION_SOLUTION | RESPIRATORY_TRACT | Status: DC
  Administered 2018-12-25: 10 mg/h via RESPIRATORY_TRACT

## 2018-12-25 MED ORDER — SODIUM CHLORIDE 0.9% FLUSH
3.0000 mL | Freq: Two times a day (BID) | INTRAVENOUS | Status: DC
  Administered 2018-12-27 – 2018-12-30 (×5): 3 mL via INTRAVENOUS

## 2018-12-25 MED ORDER — ASPIRIN EC 81 MG PO TBEC
81.0000 mg | DELAYED_RELEASE_TABLET | Freq: Every day | ORAL | Status: DC
  Administered 2018-12-26 – 2018-12-30 (×5): 81 mg via ORAL
  Filled 2018-12-25 (×5): qty 1

## 2018-12-25 MED ORDER — FUROSEMIDE 20 MG PO TABS
80.0000 mg | ORAL_TABLET | Freq: Once | ORAL | Status: DC
  Filled 2018-12-25: qty 4

## 2018-12-25 MED ORDER — LOSARTAN POTASSIUM 50 MG PO TABS
50.0000 mg | ORAL_TABLET | Freq: Every day | ORAL | Status: DC
  Administered 2018-12-26 – 2018-12-27 (×2): 50 mg via ORAL
  Filled 2018-12-25 (×2): qty 1

## 2018-12-25 MED ORDER — ACETAMINOPHEN 325 MG PO TABS
650.0000 mg | ORAL_TABLET | ORAL | Status: DC | PRN
  Administered 2018-12-26 – 2018-12-29 (×5): 650 mg via ORAL
  Filled 2018-12-25 (×6): qty 2

## 2018-12-25 MED ORDER — METHYLPREDNISOLONE SODIUM SUCC 125 MG IJ SOLR
INTRAMUSCULAR | Status: AC
  Filled 2018-12-25: qty 2

## 2018-12-25 MED ORDER — INFLUENZA VAC A&B SA ADJ QUAD 0.5 ML IM PRSY
0.5000 mL | PREFILLED_SYRINGE | INTRAMUSCULAR | Status: DC
  Filled 2018-12-25: qty 0.5

## 2018-12-25 MED ORDER — HYOSCYAMINE SULFATE 0.125 MG SL SUBL: 0.1250 mg | SUBLINGUAL_TABLET | Freq: Three times a day (TID) | SUBLINGUAL | Status: DC | PRN

## 2018-12-25 MED ORDER — ACETAMINOPHEN 500 MG PO TABS
1000.0000 mg | ORAL_TABLET | Freq: Once | ORAL | Status: AC
  Administered 2018-12-25: 1000 mg via ORAL
  Filled 2018-12-25: qty 2

## 2018-12-25 MED ORDER — ENOXAPARIN SODIUM 40 MG/0.4ML ~~LOC~~ SOLN
40.0000 mg | SUBCUTANEOUS | Status: DC
  Administered 2018-12-25: 40 mg via SUBCUTANEOUS
  Filled 2018-12-25: qty 0.4

## 2018-12-25 MED ORDER — ESCITALOPRAM OXALATE 10 MG PO TABS
15.0000 mg | ORAL_TABLET | Freq: Every day | ORAL | Status: DC
  Administered 2018-12-25 – 2018-12-29 (×5): 15 mg via ORAL
  Filled 2018-12-25 (×5): qty 2

## 2018-12-25 MED ORDER — FUROSEMIDE 10 MG/ML IJ SOLN
40.0000 mg | Freq: Two times a day (BID) | INTRAMUSCULAR | Status: DC
  Administered 2018-12-25 – 2018-12-27 (×4): 40 mg via INTRAVENOUS
  Filled 2018-12-25 (×5): qty 4

## 2018-12-25 MED ORDER — FUROSEMIDE 10 MG/ML IJ SOLN
40.0000 mg | Freq: Once | INTRAMUSCULAR | Status: AC
  Administered 2018-12-25: 40 mg via INTRAVENOUS
  Filled 2018-12-25: qty 4

## 2018-12-25 MED ORDER — METHYLPREDNISOLONE SODIUM SUCC 125 MG IJ SOLR
125.0000 mg | Freq: Once | INTRAMUSCULAR | Status: AC
  Administered 2018-12-25: 125 mg via INTRAVENOUS

## 2018-12-25 MED ORDER — FUROSEMIDE 10 MG/ML IJ SOLN
80.0000 mg | Freq: Once | INTRAMUSCULAR | Status: AC
  Administered 2018-12-25: 80 mg via INTRAVENOUS
  Filled 2018-12-25: qty 8

## 2018-12-25 MED ORDER — CARVEDILOL 6.25 MG PO TABS
6.2500 mg | ORAL_TABLET | Freq: Two times a day (BID) | ORAL | Status: DC
  Administered 2018-12-26: 6.25 mg via ORAL
  Filled 2018-12-25: qty 1

## 2018-12-25 MED ORDER — SODIUM CHLORIDE 0.9 % IV SOLN: 250.0000 mL | INTRAVENOUS | Status: DC | PRN
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
## 2018-12-25 NOTE — Progress Notes (Signed)
Took patient off of BIPAP and placed on a 4L [place]. Patient tolerating well.

## 2018-12-25 NOTE — ED Notes (Signed)
Pharmacy messaged to verify medications 

## 2018-12-25 NOTE — ED Triage Notes (Addendum)
Patient arrived by [facility] from home with severe SOB. Patient alert and shaking head yes/no to questions. Arrived on NRBM. IV established pta and received 1 sl ntg. Patient denies pain and denies any SOB yesterday, taking daily meds as prescribed.  Per Epic no HX CHF nor COPD.  Initial BP on EMS arrival   240/140, HR 140
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
## 2018-12-25 NOTE — Progress Notes (Signed)
  Echocardiogram 2D Echocardiogram has been performed.  [name] 12/25/2018, 3:20 PM
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
## 2018-12-25 NOTE — ED Provider Notes (Signed)
Medical screening examination/treatment/procedure(s) were conducted as a shared visit with non-physician practitioner(s) and myself.  I personally evaluated the patient during the encounter.   EKG Interpretation  Date/Time:  Saturday December 25 2018 [id number] EST Ventricular Rate:  116 PR Interval:    QRS Duration: 79 QT Interval:  326 QTC Calculation: 453 R Axis:   61 Text Interpretation: Sinus tachycardia Biatrial enlargement LVH with secondary repolarization abnormality ST depr, consider ischemia, inferior leads , increased since prior tracing Confirmed by Dorie Rank [phone]) on 12/25/2018 9:51:02 AM      Sx improved in the ED after bipap. Suspect sx related to CHF exacerbation.  Cardiology and medical service consulted.  Admitted for further treatment.   Dorie Rank, MD 12/26/18 [phone]

## 2018-12-25 NOTE — ED Notes (Signed)
DINNER TRAY ORDERED  

## 2018-12-26 DIAGNOSIS — I110 Hypertensive heart disease with heart failure: Secondary | ICD-10-CM | POA: Diagnosis not present

## 2018-12-26 DIAGNOSIS — J9621 Acute and chronic respiratory failure with hypoxia: Secondary | ICD-10-CM

## 2018-12-26 DIAGNOSIS — I10 Essential (primary) hypertension: Secondary | ICD-10-CM

## 2018-12-26 DIAGNOSIS — N179 Acute kidney failure, unspecified: Secondary | ICD-10-CM

## 2018-12-26 LAB — CBC WITH DIFFERENTIAL/PLATELET
Abs Immature Granulocytes: 0.03 10*3/uL (ref 0.00–0.07)
Basophils Absolute: 0 10*3/uL (ref 0.0–0.1)
Basophils Relative: 0 %
Eosinophils Absolute: 0 10*3/uL (ref 0.0–0.5)
Eosinophils Relative: 0 %
HCT: 31.3 % — ABNORMAL LOW (ref 36.0–46.0)
Hemoglobin: 10.6 g/dL — ABNORMAL LOW (ref 12.0–15.0)
Immature Granulocytes: 0 %
Lymphocytes Relative: 13 %
Lymphs Abs: 1.4 10*3/uL (ref 0.7–4.0)
MCH: 34.1 pg — ABNORMAL HIGH (ref 26.0–34.0)
MCHC: 33.9 g/dL (ref 30.0–36.0)
MCV: 100.6 fL — ABNORMAL HIGH (ref 80.0–100.0)
Monocytes Absolute: 1 10*3/uL (ref 0.1–1.0)
Monocytes Relative: 10 %
Neutro Abs: 8.5 10*3/uL — ABNORMAL HIGH (ref 1.7–7.7)
Neutrophils Relative %: 77 %
Platelets: 286 10*3/uL (ref 150–400)
RBC: 3.11 MIL/uL — ABNORMAL LOW (ref 3.87–5.11)
RDW: 12.7 % (ref 11.5–15.5)
WBC: 11 10*3/uL — ABNORMAL HIGH (ref 4.0–10.5)
nRBC: 0 % (ref 0.0–0.2)

## 2018-12-26 LAB — BASIC METABOLIC PANEL
Anion gap: 14 (ref 5–15)
BUN: 29 mg/dL — ABNORMAL HIGH (ref 8–23)
CO2: 24 mmol/L (ref 22–32)
Calcium: 9.3 mg/dL (ref 8.9–10.3)
Chloride: 96 mmol/L — ABNORMAL LOW (ref 98–111)
Creatinine, Ser: 1.38 mg/dL — ABNORMAL HIGH (ref 0.44–1.00)
GFR calc Af Amer: 46 mL/min — ABNORMAL LOW (ref >60)
GFR calc non Af Amer: 39 mL/min — ABNORMAL LOW (ref >60)
Glucose, Bld: 143 mg/dL — ABNORMAL HIGH (ref 70–99)
Potassium: 3.9 mmol/L (ref 3.5–5.1)
Sodium: 134 mmol/L — ABNORMAL LOW (ref 135–145)

## 2018-12-26 LAB — LIPID PANEL
Cholesterol: 222 mg/dL — ABNORMAL HIGH (ref 0–200)
HDL: 100 mg/dL (ref >40)
LDL Cholesterol: 105 mg/dL — ABNORMAL HIGH (ref 0–99)
Total CHOL/HDL Ratio: 2.2 RATIO
Triglycerides: 83 mg/dL (ref <150)
VLDL: 17 mg/dL (ref 0–40)

## 2018-12-26 LAB — PROCALCITONIN: Procalcitonin: 1.48 ng/mL

## 2018-12-26 LAB — LACTIC ACID, PLASMA: Lactic Acid, Venous: 3.2 mmol/L (ref 0.5–1.9)

## 2018-12-26 MED ORDER — ISOSORB DINITRATE-HYDRALAZINE 20-37.5 MG PO TABS: 1.0000 | ORAL_TABLET | Freq: Three times a day (TID) | ORAL | Status: DC

## 2018-12-26 MED ORDER — CARVEDILOL 12.5 MG PO TABS
12.5000 mg | ORAL_TABLET | Freq: Two times a day (BID) | ORAL | Status: DC
  Administered 2018-12-26 – 2018-12-30 (×8): 12.5 mg via ORAL
  Filled 2018-12-26 (×8): qty 1

## 2018-12-26 MED ORDER — ENOXAPARIN SODIUM 30 MG/0.3ML ~~LOC~~ SOLN
30.0000 mg | SUBCUTANEOUS | Status: DC
  Administered 2018-12-26 – 2018-12-29 (×4): 30 mg via SUBCUTANEOUS
  Filled 2018-12-26 (×4): qty 0.3

## 2018-12-26 NOTE — Progress Notes (Signed)
PROGRESS NOTE  [name] [id number] DOB: 1950/05/23 DOA: 12/25/2018 PCP: Isaac Bliss, Rayford Halsted, MD  Brief History   [name] is a 68 y.o. female with medical history significant of SBO; H TN; and chronic pain syndrome presenting with SOB.  She was here with PNA recently.  She was sent home from the ER and felt better after about a week.  The week after Thanksgiving she started not feeling well again and was seen.  PNA improved, sent home on medications.  This past week, she wasn't feeling as well.  [name] she was ok but she got worse and acutely SOB last night.  Her husband called 911 earlier this AM.  Intermittent SOB for the last month.  No known COVID contacts and they have been very careful.  SOB is with exertion, increased fatigue.  +orthpnea. +PND.  L leg swelling > R - started after the GI doctor started her on medication.  She gained about 6 pounds in about 2 weeks.  Patient has ongoing grief regarding the loss of her sons and her daughter is concerned this is contributing.  Found down, SOB.  EMS started BIPAP and gave NTG.  Significant tachypnea with wet rales.  Appears to be CHF exacerbation, BNP 2100, CXR with scattered pulmonary edema.  Vastly improved with BIPAP.  BD COVID negative, Hologic is pending.  Normal rectal temp.  Elevated lactate likely related to being down and respiratory distress.   The patient was admitted to a telemetry bed. She is being diuresed. Cardiology has been consulted. Echocardiogram was performed and demonstrated EF of 20-25% with Grade 1 diastolic dysfunction. There is a small pericardial effusion. There is elevated pulmonary artery systolic pressure.   Consultants  . Cardiology  Procedures  . None  Antibiotics   Anti-infectives (From admission, onward)   None    .  Subjective  The patient is resting comfortably. She states that she is feeling better.  Objective   Vitals:  Vitals:   12/26/18 0739 12/26/18 1226  BP: (!)  139/94   Pulse: 80   Resp: 17   Temp: (!) 97.4 F (36.3 C) 97.6 F (36.4 C)  SpO2: 100%    Exam:  Constitutional:  . The patient is awake, alert, and oriented x 3. No acute distress. Eyes:  . PERRLA . EOMBI . Normal lids and conjunctivae Neck:  . neck appears normal, no masses, normal ROM, supple . no thyromegaly . 11 cm JVD Respiratory:  . No increased work of breathing. . No wheezes or rhonchi . Positive for scattered rales . No tactile fremitus Cardiovascular:  . Regular rate and rhythm . No murmurs, ectopy, or gallups. . No lateral PMI. No thrills. Abdomen:  . Abdomen is soft, non-tender, non-distended . No hernias, masses, or organomegaly . Normoactive bowel sounds.  Musculoskeletal:  . No cyanosis, clubbing, or edema Skin:  . No rashes, lesions, ulcers . palpation of skin: no induration or nodules Neurologic:  . CN 2-12 intact . Sensation all 4 extremities intact Psychiatric:  . Mental status o Mood, affect appropriate o Orientation to person, place, time  . judgment and insight appear intact  I have personally reviewed the following:   Today's Data  . Vitals, CBC, BMP  Imaging  . CXR  Cardiology Data  . Echocardiogram  Scheduled Meds: . aspirin EC  81 mg Oral Daily  . atorvastatin  40 mg Oral q1800  . carvedilol  12.5 mg Oral BID WC  . enoxaparin (LOVENOX) injection  30 mg Subcutaneous Q24H  . escitalopram  15 mg Oral QHS  . furosemide  40 mg Intravenous BID  . influenza vaccine adjuvanted  0.5 mL Intramuscular Tomorrow-1000  . losartan  50 mg Oral Daily  . mirtazapine  15 mg Oral QHS  . pantoprazole  20 mg Oral Daily  . sodium chloride flush  3 mL Intravenous Q12H   Continuous Infusions: . sodium chloride    . nitroGLYCERIN 5 mcg/min (12/25/18 1507)    Principal Problem:   Acute on chronic respiratory failure with hypoxia (HCC) Active Problems:   Essential hypertension   Depression, recurrent (HCC)   Acute heart failure (HCC)    Acute on chronic respiratory failure (HCC)   Hypertensive heart disease with heart failure (HCC)   AKI (acute kidney injury) ([place])   LOS: 1 day   A & P  Acute on chronic respiratory failure with hypoxia associated with new-onset CHF: Pt has had a trial of antibiotics as outpatient. Will check procalcitonin. Cardiology has been consulted. The patient is receiving IV diuresis, Cozaar, ASA. Pt initially required BIPAP, but has been transitioned to nasal cannula.  Acute exacerbation of CHF:  EF 20% with Grade 1 diastolic dysfunction on echocardiogram. There was a markedly elevated BNP on admission. Diuresis with removal of 1 liter fluid since admission. CXR consistent with pulmonary edema. Cardiology has been consulted. The patient is receiving IV diuresis, Cozaar, ASA. Cardiology is planning for ischemic evaluation once CHF is improved.   Hypertension: BP control with NTG drip as per cardiology. Coreg 12.5 bid has been started with as needed IV lopresser. Continue Cozaar. Norvasc and HCTZ held.   Lactic acidosis: Elevated lactic likely due to hypoxia and CHF and not sepsis. Procalcitonin is pending.   AKI: Baseline creatinine is 0.94 with GFR of greater than 60. Creatinine is increased to 1.38 with GFR now reduced to 46. Check renal ultrasound. May continue to increase with diuresis. May be due to cardiorenal situation.  Hyperlipidemia: Start empiric Lipitor. LDL: 105, HDL 100  Depression:  Continue Lexapro and Remeron. Consider outpatient psych consult as long as she continues to endorse no SI; otherwise consider inpatient consult.  I have seen and examined this patient myself. I have spent 38 minutes in her evaluation and care.   DVT prophylaxis: Lovenox  Code Status:  Full - confirmed with patient/daughter Family Communication: No family at bedside. Disposition Plan:  Home once clinically improved  Reinhard Schack, DO [facility] Direct contact: see [email or web address]  7PM-7AM contact  night coverage as above 12/26/2018, 3:08 PM  LOS: 1 day

## 2018-12-26 NOTE — Evaluation (Signed)
Physical Therapy Evaluation Patient Details Name: [name] MRN: [id number] DOB: 1950/05/26 Today's Date: 12/26/2018   History of Present Illness  68 y.o. female with medical history significant of SBO; H TN; and chronic pain syndrome presenting with SOB.  She was here with PNA recently. Patient has ongoing grief regarding the loss of her sons and her daughter is concerned this is contributing. PMH includes arthritis, depression.  Clinical Impression  Pt presents to PT near functional baseline, performing bed mobility and transfers independently and requiring supervision for household ambulation on room air. Pt reports SOB at home with stair negotiation. PT deferring stair negotiation at this time as pt with IV in place and needing to ascend 13 steps. Pt will benefit from continued acute PT intervention to assess stair negotiation and cardiopulmonary function further and ensure a complete return to baseline.    Follow Up Recommendations No PT follow up    Equipment Recommendations  None recommended by PT    Recommendations for Other Services       Precautions / Restrictions Precautions Precautions: Fall Restrictions Weight Bearing Restrictions: No      Mobility  Bed Mobility Overal bed mobility: Independent                Transfers Overall transfer level: Independent                  Ambulation/Gait Ambulation/Gait assistance: Supervision Gait Distance (Feet): 100 Feet Assistive device: None Gait Pattern/deviations: Step-through pattern Gait velocity: functional Gait velocity interpretation: 1.31 - 2.62 ft/sec, indicative of limited community ambulator General Gait Details: steaqdy step through gait without noticeable balance deviations  Stairs Stairs: (not assessed as pt with IV and needing to climb 13 steps)          Wheelchair Mobility    Modified Rankin (Stroke Patients Only)       Balance Overall balance assessment: Needs  assistance Sitting-balance support: No upper extremity supported;Feet supported Sitting balance-Leahy Scale: Normal     Standing balance support: No upper extremity supported;During functional activity Standing balance-Leahy Scale: Good Standing balance comment: supervision                             Pertinent Vitals/Pain Pain Assessment: No/denies pain    Home Living Family/patient expects to be discharged to:: Private residence Living Arrangements: Spouse/significant other Available Help at Discharge: Family;Available 24 hours/day Type of Home: House Home Access: Stairs to enter Entrance Stairs-Rails: None Entrance Stairs-Number of Steps: 2 Home Layout: Two level Home Equipment: None      Prior Function Level of Independence: Independent(household distances)         Comments: pt reports staying on 2nd floor unless 60 month old grandson is over to visit     Hand Dominance        Extremity/Trunk Assessment   Upper Extremity Assessment Upper Extremity Assessment: Overall WFL for tasks assessed    Lower Extremity Assessment Lower Extremity Assessment: Overall WFL for tasks assessed    Cervical / Trunk Assessment Cervical / Trunk Assessment: Normal  Communication   Communication: No difficulties  Cognition Arousal/Alertness: Awake/alert Behavior During Therapy: WFL for tasks assessed/performed Overall Cognitive Status: Within Functional Limits for tasks assessed                                        General Comments  General comments (skin integrity, edema, etc.): VSS on room air during ambulation, saturating at or above 96%. Pt began session on 4L [place], weaned to room air.    Exercises     Assessment/Plan    PT Assessment Patient needs continued PT services  PT Problem List Decreased balance;Decreased mobility;Cardiopulmonary status limiting activity       PT Treatment Interventions Gait training;Stair training;Therapeutic  exercise;Patient/family education    PT Goals (Current goals can be found in the Care Plan section)  Acute Rehab PT Goals Patient Stated Goal: To walk up stairs without SOB PT Goal Formulation: With patient Time For Goal Achievement: 01/09/19 Potential to Achieve Goals: Good Additional Goals Additional Goal #1: Pt will maintain dynamic standing balancwe within 10 inches of her base of support independently.    Frequency Min 2X/week   Barriers to discharge        Co-evaluation               AM-PAC PT "6 Clicks" Mobility  Outcome Measure Help needed turning from your back to your side while in a flat bed without using bedrails?: None Help needed moving from lying on your back to sitting on the side of a flat bed without using bedrails?: None Help needed moving to and from a bed to a chair (including a wheelchair)?: None Help needed standing up from a chair using your arms (e.g., wheelchair or bedside chair)?: None Help needed to walk in hospital room?: None Help needed climbing 3-5 steps with a railing? : None 6 Click Score: 24    End of Session Equipment Utilized During Treatment: Oxygen Activity Tolerance: Patient tolerated treatment well Patient left: in bed;with call bell/phone within reach Nurse Communication: Mobility status PT Visit Diagnosis: Other abnormalities of gait and mobility (R26.89)    Time: [id number] PT Time Calculation (min) (ACUTE ONLY): 20 min   Charges:   PT Evaluation $PT Eval Low Complexity: [place], PT, DPT Acute Rehabilitation Pager: [phone]   Zenaida Niece 12/26/2018, 5:14 PM

## 2018-12-26 NOTE — Progress Notes (Addendum)
Progress Note  Patient Name: [name] Date of Encounter: 12/26/2018  Primary Cardiologist: Dr Meda Coffee, [place]  Subjective   She feels significantly better today, improved SOB, off BiPAP, comfortable on O2 via [place].  Inpatient Medications    Scheduled Meds: . aspirin EC  81 mg Oral Daily  . atorvastatin  40 mg Oral q1800  . carvedilol  6.25 mg Oral BID WC  . enoxaparin (LOVENOX) injection  40 mg Subcutaneous Q24H  . escitalopram  15 mg Oral QHS  . furosemide  40 mg Intravenous BID  . influenza vaccine adjuvanted  0.5 mL Intramuscular Tomorrow-1000  . losartan  50 mg Oral Daily  . mirtazapine  15 mg Oral QHS  . pantoprazole  20 mg Oral Daily  . sodium chloride flush  3 mL Intravenous Q12H   Continuous Infusions: . sodium chloride    . nitroGLYCERIN 5 mcg/min (12/25/18 1507)   PRN Meds: sodium chloride, acetaminophen, diphenoxylate-atropine, hyoscyamine, metoprolol tartrate, ondansetron (ZOFRAN) IV, sodium chloride flush   Vital Signs    Vitals:   12/25/18 2237 12/26/18 0003 12/26/18 0549 12/26/18 0739  BP: (!) 154/95 (!) 145/97 (!) 151/106 (!) 139/94  Pulse: 81 78 90 80  Resp: (!) 23 20 20 17   Temp: (!) 97.5 F (36.4 C) 97.8 F (36.6 C) 98.1 F (36.7 C) (!) 97.4 F (36.3 C)  TempSrc: Oral Oral Oral Oral  SpO2: 100% 100% 100% 100%  Weight: 50.4 kg     Height: 4\' 11"  (1.499 m)      Last 3 Weights 12/25/2018 11/11/2018 12/25/2017  Weight (lbs) 111 lb 1.8 oz 98 lb 6 oz 102 lb 8 oz  Weight (kg) 50.4 kg 44.623 kg 46.494 kg     Telemetry    SR 70-80'- Personally Reviewed  ECG    SR, LVH, diffuse negative T waves - Personally Reviewed  Physical Exam   GEN: No acute distress.   Neck: JVD + 6 cm Cardiac: RRR, 2/6 murmurs, + S4, rubs, or gallops.  Respiratory: mild crackles at bases bilaterally. GI: Soft, nontender, non-distended  MS: No edema; No deformity. Neuro:  Nonfocal  Psych: Normal affect   Labs    High Sensitivity Troponin:   Recent Labs   Lab 12/25/18 0922 12/25/18 1133  TROPONINIHS 13 39*     Chemistry Recent Labs  Lab 12/25/18 0922 12/25/18 2351  NA 133* 134*  K 3.1* 3.9  CL 99 96*  CO2 19* 24  GLUCOSE 328* 143*  BUN 24* 29*  CREATININE 1.32* 1.38*  CALCIUM 9.5 9.3  PROT 7.5  --   ALBUMIN 3.8  --   AST 28  --   ALT 17  --   ALKPHOS 69  --   BILITOT 0.5  --   GFRNONAA 42* 39*  GFRAA 48* 46*  ANIONGAP 15 14    Hematology Recent Labs  Lab 12/25/18 0922 12/25/18 2351  WBC 11.5* 11.0*  RBC 3.79* 3.11*  HGB 12.9 10.6*  HCT 39.2 31.3*  MCV 103.4* 100.6*  MCH 34.0 34.1*  MCHC 32.9 33.9  RDW 12.4 12.7  PLT 357 286    BNP Recent Labs  Lab 12/25/18 0922  BNP 2,159.0*    DDimer No results for input(s): DDIMER in the last 168 hours.   Radiology    Dg Chest Portable 1 View  Result Date: 12/25/2018 CLINICAL DATA:  Shortness of breath. EXAM: PORTABLE CHEST 1 VIEW COMPARISON:  11/29/2018 FINDINGS: Heart size is stable and mildly enlarged. Central airways are  patent. Increased interstitial markings with similar pattern but with more pronounced changes in the right lower lobe since the previous exam. Small amount of fluid likely present in the fissure the right. No acute bone findings are noted. Leads project over the anterior chest. IMPRESSION: 1. Increasing interstitial prominence may represent pneumonitis from viral or atypical infection or asymmetric pulmonary edema. 2. Stable mild cardiomegaly. Electronically Signed   By: Zetta Bills M.D.   On: 12/25/2018 09:47   Cardiac Studies   TTE: 12/25/2018   1. Left ventricular ejection fraction, by visual estimation, is 20 to 25%. The left ventricle has severely decreased function. There is moderately increased left ventricular hypertrophy.  2. Left ventricular diastolic parameters are consistent with Grade I diastolic dysfunction (impaired relaxation).  3. Global right ventricle has normal systolic function.The right ventricular size is normal. No  increase in right ventricular wall thickness.  4. Left atrial size was mildly dilated.  5. Right atrial size was normal.  6. Small pericardial effusion.  7. The pericardial effusion is posterior to the left ventricle.  8. The mitral valve is normal in structure. Mild mitral valve regurgitation. No evidence of mitral stenosis.  9. The tricuspid valve is normal in structure. Tricuspid valve regurgitation is mild. 10. The aortic valve is normal in structure. Aortic valve regurgitation is not visualized. No evidence of aortic valve sclerosis or stenosis. 11. The pulmonic valve was normal in structure. Pulmonic valve regurgitation is not visualized. 12. Mildly elevated pulmonary artery systolic pressure. 13. The tricuspid regurgitant velocity is 2.71 m/s, and with an assumed right atrial pressure of 8 mmHg, the estimated right ventricular systolic pressure is mildly elevated at 37.4 mmHg. 14. The inferior vena cava is dilated in size with >50% respiratory variability, suggesting right atrial pressure of 8 mmHg.   Patient Profile     68 y.o. female with significant family h/o early CAD, and advanced CHF in many 60st and 2nd degree relatives  Assessment & Plan    1.  Acute hypoxic respiratory failure:  -Secondary to acute CHF  - Off Bipap, on O2 via [place], saturating > 90%  2.  Acute CHF:  - LVEF 20-25%, diffuse hypokinesis, moderate LVH, etiology includes hypertensive disease with CHF, amyloidosis, doubt ischemic - we will order cardiac MRI, she will need cardiac CT once her crea improves - increase carvedilol to 12.5 mg po BID - started on losartan by primary care, we will follow crea closely, switch to entresto if crea stable Crea normal back in October 2020 - weight down to 46 kg from 50 kg, baseline 45 kg - consider spironolactone once crea improves  3.  Hypokalemia: replete --Daily BMET with diuresis  4.  Leukocytosis: WBC 11.5, lactic acid elevated 4.1> 2.9>3.2.  No fever on  admission but chest x-ray questionable for viral/atypical infection.  Would defer management to internal medicine.  5.  Hypertension: as above  For questions or updates, please contact [place] Please consult [email or web address] for contact info under    Signed, Ena Dawley, MD  12/26/2018, 10:02 AM

## 2018-12-27 ENCOUNTER — Inpatient Hospital Stay (HOSPITAL_COMMUNITY): Payer: Medicare Other

## 2018-12-27 DIAGNOSIS — I5033 Acute on chronic diastolic (congestive) heart failure: Secondary | ICD-10-CM

## 2018-12-27 DIAGNOSIS — N179 Acute kidney failure, unspecified: Secondary | ICD-10-CM | POA: Diagnosis not present

## 2018-12-27 DIAGNOSIS — I5041 Acute combined systolic (congestive) and diastolic (congestive) heart failure: Secondary | ICD-10-CM

## 2018-12-27 DIAGNOSIS — J9621 Acute and chronic respiratory failure with hypoxia: Secondary | ICD-10-CM | POA: Diagnosis not present

## 2018-12-27 DIAGNOSIS — I10 Essential (primary) hypertension: Secondary | ICD-10-CM | POA: Diagnosis not present

## 2018-12-27 LAB — BASIC METABOLIC PANEL
Anion gap: 11 (ref 5–15)
BUN: 39 mg/dL — ABNORMAL HIGH (ref 8–23)
CO2: 28 mmol/L (ref 22–32)
Calcium: 9.1 mg/dL (ref 8.9–10.3)
Chloride: 95 mmol/L — ABNORMAL LOW (ref 98–111)
Creatinine, Ser: 1.18 mg/dL — ABNORMAL HIGH (ref 0.44–1.00)
GFR calc Af Amer: 55 mL/min — ABNORMAL LOW (ref >60)
GFR calc non Af Amer: 48 mL/min — ABNORMAL LOW (ref >60)
Glucose, Bld: 97 mg/dL (ref 70–99)
Potassium: 3.3 mmol/L — ABNORMAL LOW (ref 3.5–5.1)
Sodium: 134 mmol/L — ABNORMAL LOW (ref 135–145)

## 2018-12-27 MED ORDER — POTASSIUM CHLORIDE CRYS ER 20 MEQ PO TBCR
40.0000 meq | EXTENDED_RELEASE_TABLET | Freq: Once | ORAL | Status: AC
  Administered 2018-12-27: 40 meq via ORAL
  Filled 2018-12-27: qty 2

## 2018-12-27 MED ORDER — SACUBITRIL-VALSARTAN 49-51 MG PO TABS
1.0000 | ORAL_TABLET | Freq: Two times a day (BID) | ORAL | Status: DC
  Administered 2018-12-27 (×2): 1 via ORAL
  Filled 2018-12-27 (×3): qty 1

## 2018-12-27 MED ORDER — FUROSEMIDE 10 MG/ML IJ SOLN
80.0000 mg | Freq: Two times a day (BID) | INTRAMUSCULAR | Status: DC
  Administered 2018-12-27 – 2018-12-28 (×2): 80 mg via INTRAVENOUS
  Filled 2018-12-27 (×2): qty 8

## 2018-12-27 MED ORDER — GADOBUTROL 1 MMOL/ML IV SOLN
8.0000 mL | Freq: Once | INTRAVENOUS | Status: AC | PRN
  Administered 2018-12-27: 8 mL via INTRAVENOUS

## 2018-12-27 MED ORDER — FUROSEMIDE 10 MG/ML IJ SOLN
60.0000 mg | Freq: Once | INTRAMUSCULAR | Status: AC
  Administered 2018-12-27: 60 mg via INTRAVENOUS
  Filled 2018-12-27: qty 6

## 2018-12-27 NOTE — Progress Notes (Signed)
Paged PA Barrett ref to stopping pt's Nitro gtt. Pt is taking po meds well. Denies chest pain. Pt only c/o headache. Vitals stable. Per PA titrate off. Will continue to monitor.  Jerald Kief, RN

## 2018-12-27 NOTE — Progress Notes (Signed)
Pt currently off BiPAP on 2L [place] tolerating well with a saturation of 100%. RT will continue to monitor.

## 2018-12-27 NOTE — Progress Notes (Signed)
Progress Note  Patient Name: [name] Date of Encounter: 12/27/2018  Primary Cardiologist: No primary care provider on file.   Subjective   Feeling better but still short of breath.  Unable to lay flat.  Inpatient Medications    Scheduled Meds: . aspirin EC  81 mg Oral Daily  . atorvastatin  40 mg Oral q1800  . carvedilol  12.5 mg Oral BID WC  . enoxaparin (LOVENOX) injection  30 mg Subcutaneous Q24H  . escitalopram  15 mg Oral QHS  . furosemide  60 mg Intravenous Once  . furosemide  80 mg Intravenous BID  . influenza vaccine adjuvanted  0.5 mL Intramuscular Tomorrow-1000  . losartan  50 mg Oral Daily  . mirtazapine  15 mg Oral QHS  . pantoprazole  20 mg Oral Daily  . sodium chloride flush  3 mL Intravenous Q12H   Continuous Infusions: . sodium chloride    . nitroGLYCERIN 3 mcg/min (12/27/18 0740)   PRN Meds: sodium chloride, acetaminophen, diphenoxylate-atropine, hyoscyamine, ondansetron (ZOFRAN) IV, sodium chloride flush   Vital Signs    Vitals:   12/27/18 0355 12/27/18 0810 12/27/18 0820 12/27/18 0900  BP: (!) 141/86 (!) 155/87 (!) 155/87   Pulse: 63 69 69 65  Resp: 16 20  19   Temp: 98 F (36.7 C) 98.2 F (36.8 C)    TempSrc: Oral Oral    SpO2: 99% 100%  99%  Weight: 48.3 kg     Height:        Intake/Output Summary (Last 24 hours) at 12/27/2018 0951 Last data filed at 12/26/2018 1743 Gross per 24 hour  Intake 493.5 ml  Output 800 ml  Net -306.5 ml   Last 3 Weights 12/27/2018 12/25/2018 11/11/2018  Weight (lbs) 106 lb 7.7 oz 111 lb 1.8 oz 98 lb 6 oz  Weight (kg) 48.3 kg 50.4 kg 44.623 kg      Telemetry    Sinus rhythm.  No events. - Personally Reviewed  ECG    Sinus rhythm.  Rate 80 bpm.  LVH with repolarization abnormalities.  QTc 525 ms.  - Personally Reviewed  Physical Exam   VS:  BP (!) 155/87   Pulse 65   Temp 98.2 F (36.8 C) (Oral)   Resp 19   Ht 4\' 11"  (1.499 m)   Wt 48.3 kg Comment: scale B  SpO2 99%   BMI 21.51 kg/m  ,  BMI Body mass index is 21.51 kg/m. GENERAL:  Well appearing HEENT: Pupils equal round and reactive, fundi not visualized, oral mucosa unremarkable NECK:  + jugular venous distention, waveform within normal limits, carotid upstroke brisk and symmetric, no bruits LUNGS:  Clear to auscultation bilaterally HEART:  RRR.  PMI not displaced or sustained,S1 and S2 within normal limits, no S3, no S4, no clicks, no rubs, no murmurs ABD:  Flat, positive bowel sounds normal in frequency in pitch, no bruits, no rebound, no guarding, no midline pulsatile mass, no hepatomegaly, no splenomegaly EXT:  2 plus pulses throughout, no edema, no cyanosis no clubbing SKIN:  No rashes no nodules NEURO:  Cranial nerves II through XII grossly intact, motor grossly intact throughout PSYCH:  Cognitively intact, oriented to person place and time   Labs    High Sensitivity Troponin:   Recent Labs  Lab 12/25/18 0922 12/25/18 1133  TROPONINIHS 13 39*      Chemistry Recent Labs  Lab 12/25/18 0922 12/25/18 2351 12/27/18 0304  NA 133* 134* 134*  K 3.1* 3.9 3.3*  CL 99 96* 95*  CO2 19* 24 28  GLUCOSE 328* 143* 97  BUN 24* 29* 39*  CREATININE 1.32* 1.38* 1.18*  CALCIUM 9.5 9.3 9.1  PROT 7.5  --   --   ALBUMIN 3.8  --   --   AST 28  --   --   ALT 17  --   --   ALKPHOS 69  --   --   BILITOT 0.5  --   --   GFRNONAA 42* 39* 48*  GFRAA 48* 46* 55*  ANIONGAP 15 14 11      Hematology Recent Labs  Lab 12/25/18 0922 12/25/18 2351  WBC 11.5* 11.0*  RBC 3.79* 3.11*  HGB 12.9 10.6*  HCT 39.2 31.3*  MCV 103.4* 100.6*  MCH 34.0 34.1*  MCHC 32.9 33.9  RDW 12.4 12.7  PLT 357 286    BNP Recent Labs  Lab 12/25/18 0922  BNP 2,159.0*     DDimer No results for input(s): DDIMER in the last 168 hours.   Radiology    No results found.  Cardiac Studies   Echo 12/25/18: 1. Left ventricular ejection fraction, by visual estimation, is 20 to 25%. The left ventricle has severely decreased function. There  is moderately increased left ventricular hypertrophy.  2. Left ventricular diastolic parameters are consistent with Grade I diastolic dysfunction (impaired relaxation).  3. Global right ventricle has normal systolic function.The right ventricular size is normal. No increase in right ventricular wall thickness.  4. Left atrial size was mildly dilated.  5. Right atrial size was normal.  6. Small pericardial effusion.  7. The pericardial effusion is posterior to the left ventricle.  8. The mitral valve is normal in structure. Mild mitral valve regurgitation. No evidence of mitral stenosis.  9. The tricuspid valve is normal in structure. Tricuspid valve regurgitation is mild. 10. The aortic valve is normal in structure. Aortic valve regurgitation is not visualized. No evidence of aortic valve sclerosis or stenosis. 11. The pulmonic valve was normal in structure. Pulmonic valve regurgitation is not visualized. 12. Mildly elevated pulmonary artery systolic pressure. 13. The tricuspid regurgitant velocity is 2.71 m/s, and with an assumed right atrial pressure of 8 mmHg, the estimated right ventricular systolic pressure is mildly elevated at 37.4 mmHg. 14. The inferior vena cava is dilated in size with >50% respiratory variability, suggesting right atrial pressure of 8 mmHg.    Patient Profile     68 y.o. female with hypertension and family history of premature CAD and HF admitted with acute systolic and diastolic heart failure.   Assessment & Plan    # Acute systolic and diastolic heart failure: LVEF 20-25%.  She remains volume overloaded.  Moderate LVH on echo.  Will plan for cardiac MRI inpatient once more euvolemic.  She is unable to lay flat.  She was only net -600 mL with 40mg  IV lasix bid.  Will increase to 80mg  IV BID.  Switch losartan to [facility]  # Acute hypoxic heart failure: Now off BiPAP.  Diurese as above.   # Essential hypertension:  BP poorly controlled. Continue carvedilol and  switch losartan to Entresto as above.   # AKI:  Resolving. Watch closely with diuresis and adding Entresto.      For questions or updates, please contact [place] Please consult [email or web address] for contact info under        Signed, Skeet Latch, MD  12/27/2018, 9:51 AM

## 2018-12-27 NOTE — Progress Notes (Addendum)
PROGRESS NOTE  [name] [id number] DOB: [birth year] DOA: 12/25/2018 PCP: Isaac Bliss, Rayford Halsted, MD  Brief History   [name] is a 68 y.o. female with medical history significant of SBO; H TN; and chronic pain syndrome presenting with SOB.  She was here with PNA recently.  She was sent home from the ER and felt better after about a week.  The week after Thanksgiving she started not feeling well again and was seen.  PNA improved, sent home on medications.  This past week, she wasn't feeling as well.  [name] she was ok but she got worse and acutely SOB last night.  Her husband called 911 earlier this AM.  Intermittent SOB for the last month.  No known COVID contacts and they have been very careful.  SOB is with exertion, increased fatigue.  +orthpnea. +PND.  L leg swelling > R - started after the GI doctor started her on medication.  She gained about 6 pounds in about 2 weeks.  Patient has ongoing grief regarding the loss of her sons and her daughter is concerned this is contributing.   Found down, SOB.  EMS started BIPAP and gave NTG.  Significant tachypnea with wet rales.  Appears to be CHF exacerbation, BNP 2100, CXR with scattered pulmonary edema.  Vastly improved with BIPAP.  BD COVID negative, Hologic is pending.  Normal rectal temp.  Elevated lactate likely related to being down and respiratory distress.   The patient was admitted to a telemetry bed. She is being diuresed. Cardiology has been consulted. Echocardiogram was performed and demonstrated EF of 20-25% with Grade 1 diastolic dysfunction. There is a small pericardial effusion. There is elevated pulmonary artery systolic pressure.   Consultants  Cardiology  Procedures  None  Antibiotics   Anti-infectives (From admission, onward)    None      Subjective  The patient is resting comfortably. She states that she is feeling better again today, but she is still unable to lie flat.  Objective   Vitals:   Vitals:   12/27/18 1710 12/27/18 1719  BP: 108/67 108/67  Pulse: 91 84  Resp: 20   Temp: 98.4 F (36.9 C)   SpO2: 96%    Exam:  Constitutional:  The patient is awake, alert, and oriented x 3. No acute distress. Eyes:  PERRLA EOMBI Normal lids and conjunctivae Neck:  neck appears normal, no masses, normal ROM, supple no thyromegaly 9 cm JVD Respiratory:  No increased work of breathing. No wheezes or rhonchi Positive for scattered rales No tactile fremitus Cardiovascular:  Regular rate and rhythm No murmurs, ectopy, or gallups. No lateral PMI. No thrills. Abdomen:  Abdomen is soft, non-tender, non-distended No hernias, masses, or organomegaly Normoactive bowel sounds.  Musculoskeletal:  No cyanosis, clubbing, or edema Skin:  No rashes, lesions, ulcers palpation of skin: no induration or nodules Neurologic:  CN 2-12 intact Sensation all 4 extremities intact Psychiatric:  Mental status Mood, affect appropriate Orientation to person, place, time  judgment and insight appear intact  I have personally reviewed the following:   Today's Data  Vitals, CBC, BMP  Imaging  CXR  Cardiology Data  Echocardiogram  Scheduled Meds:  aspirin EC  81 mg Oral Daily   atorvastatin  40 mg Oral q1800   carvedilol  12.5 mg Oral BID WC   enoxaparin (LOVENOX) injection  30 mg Subcutaneous Q24H   escitalopram  15 mg Oral QHS   furosemide  80 mg Intravenous BID   influenza vaccine  adjuvanted  0.5 mL Intramuscular Tomorrow-1000   mirtazapine  15 mg Oral QHS   pantoprazole  20 mg Oral Daily   sacubitril-valsartan  1 tablet Oral BID   sodium chloride flush  3 mL Intravenous Q12H   Continuous Infusions:  sodium chloride     nitroGLYCERIN 3 mcg/min (12/27/18 0740)    Principal Problem:   Acute on chronic respiratory failure with hypoxia (HCC) Active Problems:   Essential hypertension   Depression, recurrent (HCC)   Acute heart failure (HCC)   Acute on chronic  respiratory failure (HCC)   Hypertensive heart disease with heart failure (HCC)   AKI (acute kidney injury) ([place])   LOS: 2 days   A & P  Acute on chronic respiratory failure with hypoxia associated with new-onset CHF: Pt has had a trial of antibiotics as outpatient. Will check procalcitonin. Cardiology has been consulted. The patient is receiving IV diuresis, Cozaar, ASA. Pt initially required BIPAP, but has been transitioned to nasal cannula, and now to room air with saturations in the 90's. Monitor.  Acute exacerbation of CHF:  EF 20% with Grade 1 diastolic dysfunction on echocardiogram. There was a markedly elevated BNP on admission. Diuresis with removal of 1 liter fluid since admission. CXR consistent with pulmonary edema. Cardiology has been consulted. The patient is receiving IV diuresis, Cozaar, ASA. Cardiology is planning for ischemic evaluation once CHF is improved. Procalcitonin, but patient is improving without IV antibiotics. She is afebrile and has only a slight leukocytosis. Will recheck CXR in the am.  Hypertension: BP control with NTG drip as per cardiology. Coreg 12.5 bid has been started with as needed IV lopresser. Continue Cozaar. Norvasc and HCTZ held.   Lactic acidosis: Elevated lactic likely due to hypoxia and CHF and not sepsis. Procalcitonin is pending.   AKI on CKD IIIa: Baseline creatinine is 0.94 with GFR of greater than 60. Creatinine is increased to 1.38 with GFR now reduced to 46. Check renal ultrasound. May continue to increase with diuresis. May be due to cardiorenal situation.   Hyperlipidemia: Start empiric Lipitor. LDL: 105, HDL 100   Depression:  Continue Lexapro and Remeron. Consider outpatient psych consult as long as she continues to endorse no SI; otherwise consider inpatient consult.  I have seen and examined this patient myself. I have spent 30 minutes in her evaluation and care.    DVT prophylaxis: Lovenox  Code Status:  Full - confirmed with  patient/daughter Family Communication: No family at bedside. Disposition Plan:  Home once clinically improved  Aeon Kessner, DO [facility] Direct contact: see [email or web address]  7PM-7AM contact night coverage as above 12/26/2018, 3:08 PM  LOS: 1 day

## 2018-12-28 ENCOUNTER — Inpatient Hospital Stay (HOSPITAL_COMMUNITY): Payer: Medicare Other

## 2018-12-28 DIAGNOSIS — N179 Acute kidney failure, unspecified: Secondary | ICD-10-CM | POA: Diagnosis not present

## 2018-12-28 DIAGNOSIS — J9621 Acute and chronic respiratory failure with hypoxia: Secondary | ICD-10-CM | POA: Diagnosis not present

## 2018-12-28 LAB — BASIC METABOLIC PANEL
Anion gap: 12 (ref 5–15)
BUN: 35 mg/dL — ABNORMAL HIGH (ref 8–23)
CO2: 32 mmol/L (ref 22–32)
Calcium: 9.6 mg/dL (ref 8.9–10.3)
Chloride: 92 mmol/L — ABNORMAL LOW (ref 98–111)
Creatinine, Ser: 1.38 mg/dL — ABNORMAL HIGH (ref 0.44–1.00)
GFR calc Af Amer: 46 mL/min — ABNORMAL LOW (ref >60)
GFR calc non Af Amer: 39 mL/min — ABNORMAL LOW (ref >60)
Glucose, Bld: 127 mg/dL — ABNORMAL HIGH (ref 70–99)
Potassium: 3.7 mmol/L (ref 3.5–5.1)
Sodium: 136 mmol/L (ref 135–145)

## 2018-12-28 MED ORDER — SACUBITRIL-VALSARTAN 24-26 MG PO TABS
1.0000 | ORAL_TABLET | Freq: Two times a day (BID) | ORAL | Status: DC
  Administered 2018-12-28 – 2018-12-30 (×5): 1 via ORAL
  Filled 2018-12-28 (×6): qty 1

## 2018-12-28 MED ORDER — FUROSEMIDE 80 MG PO TABS: 80.0000 mg | ORAL_TABLET | Freq: Every day | ORAL | Status: DC

## 2018-12-28 NOTE — Progress Notes (Signed)
Progress Note  Patient Name: [name] Date of Encounter: 12/28/2018  Primary Cardiologist: No primary care provider on file.   Subjective   Feeling much better.  Breathing continues to improve.  Abdomen still feels distended.   Inpatient Medications    Scheduled Meds: . aspirin EC  81 mg Oral Daily  . atorvastatin  40 mg Oral q1800  . carvedilol  12.5 mg Oral BID WC  . enoxaparin (LOVENOX) injection  30 mg Subcutaneous Q24H  . escitalopram  15 mg Oral QHS  . furosemide  80 mg Intravenous BID  . influenza vaccine adjuvanted  0.5 mL Intramuscular Tomorrow-1000  . mirtazapine  15 mg Oral QHS  . pantoprazole  20 mg Oral Daily  . sacubitril-valsartan  1 tablet Oral BID  . sodium chloride flush  3 mL Intravenous Q12H   Continuous Infusions: . sodium chloride    . nitroGLYCERIN 3 mcg/min (12/27/18 0740)   PRN Meds: sodium chloride, acetaminophen, diphenoxylate-atropine, hyoscyamine, ondansetron (ZOFRAN) IV, sodium chloride flush   Vital Signs    Vitals:   12/27/18 2348 12/28/18 0000 12/28/18 0538 12/28/18 0800  BP: 98/70  104/69 97/75  Pulse: 70   73  Resp: (!) 21 17 19 18   Temp: 98.1 F (36.7 C)  97.9 F (36.6 C) 98.1 F (36.7 C)  TempSrc: Oral  Oral Oral  SpO2: 99%  100% 96%  Weight:   46.4 kg   Height:        Intake/Output Summary (Last 24 hours) at 12/28/2018 1023 Last data filed at 12/27/2018 1923 Gross per 24 hour  Intake 120 ml  Output 1600 ml  Net -1480 ml   Last 3 Weights 12/28/2018 12/27/2018 12/25/2018  Weight (lbs) 102 lb 6.4 oz 106 lb 7.7 oz 111 lb 1.8 oz  Weight (kg) 46.448 kg 48.3 kg 50.4 kg      Telemetry    Sinus rhythm.  No events. - Personally Reviewed  ECG    Sinus rhythm.  Rate 80 bpm.  LVH with repolarization abnormalities.  QTc 525 ms.  - Personally Reviewed  Physical Exam   VS:  BP 97/75 (BP Location: Left Arm)   Pulse 73   Temp 98.1 F (36.7 C) (Oral)   Resp 18   Ht 4\' 11"  (1.499 m)   Wt 46.4 kg   SpO2 96%   BMI  20.68 kg/m  , BMI Body mass index is 20.68 kg/m. GENERAL:  Well appearing HEENT: Pupils equal round and reactive, fundi not visualized, oral mucosa unremarkable NECK:  no jugular venous distention, waveform within normal limits, carotid upstroke brisk and symmetric, no bruits LUNGS:  Clear to auscultation bilaterally HEART:  RRR.  PMI not displaced or sustained,S1 and S2 within normal limits, no S3, no S4, no clicks, no rubs, no murmurs ABD:  Flat, positive bowel sounds normal in frequency in pitch, no bruits, no rebound, no guarding, no midline pulsatile mass, no hepatomegaly, no splenomegaly EXT:  2 plus pulses throughout, no edema, no cyanosis no clubbing SKIN:  No rashes no nodules NEURO:  Cranial nerves II through XII grossly intact, motor grossly intact throughout PSYCH:  Cognitively intact, oriented to person place and time   Labs    High Sensitivity Troponin:   Recent Labs  Lab 12/25/18 0922 12/25/18 1133  TROPONINIHS 13 39*      Chemistry Recent Labs  Lab 12/25/18 0922 12/25/18 2351 12/27/18 0304 12/28/18 0315  NA 133* 134* 134* 136  K 3.1* 3.9 3.3* 3.7  CL 99 96* 95* 92*  CO2 19* 24 28 32  GLUCOSE 328* 143* 97 127*  BUN 24* 29* 39* 35*  CREATININE 1.32* 1.38* 1.18* 1.38*  CALCIUM 9.5 9.3 9.1 9.6  PROT 7.5  --   --   --   ALBUMIN 3.8  --   --   --   AST 28  --   --   --   ALT 17  --   --   --   ALKPHOS 69  --   --   --   BILITOT 0.5  --   --   --   GFRNONAA 42* 39* 48* 39*  GFRAA 48* 46* 55* 46*  ANIONGAP 15 14 11 12      Hematology Recent Labs  Lab 12/25/18 0922 12/25/18 2351  WBC 11.5* 11.0*  RBC 3.79* 3.11*  HGB 12.9 10.6*  HCT 39.2 31.3*  MCV 103.4* 100.6*  MCH 34.0 34.1*  MCHC 32.9 33.9  RDW 12.4 12.7  PLT 357 286    BNP Recent Labs  Lab 12/25/18 0922  BNP 2,159.0*     DDimer No results for input(s): DDIMER in the last 168 hours.   Radiology    Dg Chest 2 View  Result Date: 12/28/2018 CLINICAL DATA:  Congestive heart  failure. Hypoxia. EXAM: CHEST - 2 VIEW COMPARISON:  12/25/2018 FINDINGS: Heart size is mildly enlarged. Chronic atherosclerosis and tortuosity of the aorta. Resolution of interstitial and alveolar edema pattern. No effusion. Lungs well aerated presently. No significant bone finding. IMPRESSION: Resolution of interstitial and alveolar edema pattern. Mild cardiac enlargement. No active process presently. Electronically Signed   By: Nelson Chimes M.D.   On: 12/28/2018 08:26   Mr Cardiac Morphology W Wo Contrast  Result Date: 12/27/2018 CLINICAL DATA:  68 year old female with new diagnosis of combined systolic and diastolic CHF. EXAM: CARDIAC MRI TECHNIQUE: The patient was scanned on a 1.5 Tesla GE magnet. A dedicated cardiac coil was used. Functional imaging was done using Fiesta sequences. 2,3, and 4 chamber views were done to assess for RWMA's. Modified Simpson's rule using a short axis stack was used to calculate an ejection fraction on a dedicated work Conservation officer, nature. The patient received 7 cc of Gadavist. After 10 minutes inversion recovery sequences were used to assess for infiltration and scar tissue. CONTRAST:  7 cc  of Gadavist FINDINGS: 1. Normal left ventricular size with moderate concentric hypertrophy and severely impaired systolic function (LVEF = 31%). Diffuse hypokinesis. There are focal gadolinium enhancements at the attachment points of the right ventricle to the left ventricle consistent with fluid overload. LVEDD: 49 mm LVESD: 38 mm LVEDV: 87 ml LVESV: 60 ml SV: 26 ml CO: 11.98 L/min Myocardial mass: 141 g 2. Normal right ventricular size, thickness and systolic function (LVEF = 49%). There are no regional wall motion abnormalities. RVEDV: 53 ml RVESV: 27 ml SV: 26 ml 3.  Normal left and right atrial size. 4. Normal size of the aortic root, ascending aorta. Mildly dilated pulmonary artery measuring 32 mm. 5.  Mild mitral and tricuspid regurgitation. 6.  Normal pericardium.  Mild  circumferential pericardial effusion. IMPRESSION: 1. Normal left ventricular size with moderate concentric hypertrophy and severely impaired systolic function (LVEF = 31%) with diffuse hypokinesis. There are focal gadolinium enhancements at the attachment points of the right ventricle to the left ventricle consistent with fluid overload. Native T1 1085 ms, Post contrast T1 255 ms, ECV 28%. These findings are consistent with hypertensive heart disease  with CHF. 2. Normal right ventricular size, thickness and systolic function (LVEF = 49%). There are no regional wall motion abnormalities. 3. Mildly dilated pulmonary artery measuring 32 mm. 4. Mild mitral and tricuspid regurgitation. 5. Mild circumferential pericardial effusion. Electronically Signed   By: Ena Dawley   On: 12/27/2018 22:46    Cardiac Studies   Echo 12/25/18: 1. Left ventricular ejection fraction, by visual estimation, is 20 to 25%. The left ventricle has severely decreased function. There is moderately increased left ventricular hypertrophy.  2. Left ventricular diastolic parameters are consistent with Grade I diastolic dysfunction (impaired relaxation).  3. Global right ventricle has normal systolic function.The right ventricular size is normal. No increase in right ventricular wall thickness.  4. Left atrial size was mildly dilated.  5. Right atrial size was normal.  6. Small pericardial effusion.  7. The pericardial effusion is posterior to the left ventricle.  8. The mitral valve is normal in structure. Mild mitral valve regurgitation. No evidence of mitral stenosis.  9. The tricuspid valve is normal in structure. Tricuspid valve regurgitation is mild. 10. The aortic valve is normal in structure. Aortic valve regurgitation is not visualized. No evidence of aortic valve sclerosis or stenosis. 11. The pulmonic valve was normal in structure. Pulmonic valve regurgitation is not visualized. 12. Mildly elevated pulmonary artery  systolic pressure. 13. The tricuspid regurgitant velocity is 2.71 m/s, and with an assumed right atrial pressure of 8 mmHg, the estimated right ventricular systolic pressure is mildly elevated at 37.4 mmHg. 14. The inferior vena cava is dilated in size with >50% respiratory variability, suggesting right atrial pressure of 8 mmHg.  Cardiac MRI 12/27/18: IMPRESSION: 1. Normal left ventricular size with moderate concentric hypertrophy and severely impaired systolic function (LVEF = 31%) with diffuse hypokinesis.  There are focal gadolinium enhancements at the attachment points of the right ventricle to the left ventricle consistent with fluid overload.  Native T1 1085 ms, Post contrast T1 255 ms, ECV 28%. These findings are consistent with hypertensive heart disease with CHF.  2. Normal right ventricular size, thickness and systolic function (LVEF = 49%). There are no regional wall motion abnormalities.  3. Mildly dilated pulmonary artery measuring 32 mm.  4. Mild mitral and tricuspid regurgitation.  5. Mild circumferential pericardial effusion.    Patient Profile     68 y.o. female with hypertension and family history of premature CAD and HF admitted with acute systolic and diastolic heart failure.   Assessment & Plan    # Acute systolic and diastolic heart failure: # Essential hypertension:  LVEF 20-25%.  Moderate LVH on echo.  Cardiac MRI did not show an infiltrative cardiomyopathy and was more consistent with hypertensive cardiomyopathy.  She is still a little volume overloaded but much better.  Will give one more dose of IV lasix.  Plan to start oral tomorrow.  Reduce Entresto 2/2 hypotension.  Yesterday she received losartan and a couple doses of Entresto.  Plan to get coronary CT-A after diuresis and presuming renal function is stable.   # Acute hypoxic heart failure: Now off BiPAP.  Diurese as above.   # AKI:  Resolving. Watch closely with diuresis and adding  Entresto.  # Hyperlipidemia:  Atorvastatin was started this admission.     For questions or updates, please contact [place] Please consult [email or web address] for contact info under        Signed, Skeet Latch, MD  12/28/2018, 10:23 AM

## 2018-12-28 NOTE — Progress Notes (Signed)
Physical Therapy Treatment Patient Details Name: [name] MRN: [id number] DOB: [birth year] Today's Date: 12/28/2018    History of Present Illness 68 y.o. female with medical history significant of SBO; H TN; and chronic pain syndrome presenting with SOB.  She was here with PNA recently. Patient has ongoing grief regarding the loss of her sons and her daughter is concerned this is contributing. PMH includes arthritis, depression.    PT Comments    Patient is making great progress toward PT goals and tolerated gait/stair training well. VSS on RA. Current plan remains appropriate.    Follow Up Recommendations  No PT follow up     Equipment Recommendations  None recommended by PT    Recommendations for Other Services       Precautions / Restrictions Precautions Precautions: Fall Restrictions Weight Bearing Restrictions: No    Mobility  Bed Mobility Overal bed mobility: Independent                Transfers Overall transfer level: Independent                  Ambulation/Gait Ambulation/Gait assistance: Modified independent (Device/Increase time) Gait Distance (Feet): 400 Feet Assistive device: None Gait Pattern/deviations: Step-through pattern     General Gait Details: decreaesed cadence   Stairs Stairs: Yes Stairs assistance: Modified independent (Device/Increase time) Stair Management: One rail Right;Alternating pattern;Step to pattern;Forwards Number of Stairs: (flight)     Wheelchair Mobility    Modified Rankin (Stroke Patients Only)       Balance Overall balance assessment: Needs assistance Sitting-balance support: No upper extremity supported;Feet supported Sitting balance-Leahy Scale: Normal     Standing balance support: No upper extremity supported;During functional activity Standing balance-Leahy Scale: Good                              Cognition Arousal/Alertness: Awake/alert Behavior During Therapy: WFL for  tasks assessed/performed Overall Cognitive Status: Within Functional Limits for tasks assessed                                        Exercises      General Comments General comments (skin integrity, edema, etc.): VSS on RA       Pertinent Vitals/Pain Pain Assessment: No/denies pain    Home Living                      Prior Function            PT Goals (current goals can now be found in the care plan section) Progress towards PT goals: Progressing toward goals    Frequency    Min 2X/week      PT Plan Current plan remains appropriate    Co-evaluation              AM-PAC PT "6 Clicks" Mobility   Outcome Measure  Help needed turning from your back to your side while in a flat bed without using bedrails?: None Help needed moving from lying on your back to sitting on the side of a flat bed without using bedrails?: None Help needed moving to and from a bed to a chair (including a wheelchair)?: None Help needed standing up from a chair using your arms (e.g., wheelchair or bedside chair)?: None Help needed to walk in hospital room?: None Help needed climbing  3-5 steps with a railing? : None 6 Click Score: 24    End of Session Equipment Utilized During Treatment: Gait belt Activity Tolerance: Patient tolerated treatment well Patient left: with call bell/phone within reach;in chair;with nursing/sitter in room Nurse Communication: Mobility status PT Visit Diagnosis: Other abnormalities of gait and mobility (R26.89)     Time: [id number] PT Time Calculation (min) (ACUTE ONLY): 11 min  Charges:  $Gait Training: 8-22 mins                     Earney Navy, PTA Acute Rehabilitation Services Pager: [phone] Office: [phone]     Darliss Cheney 12/28/2018, 1:57 PM

## 2018-12-28 NOTE — Progress Notes (Signed)
PROGRESS NOTE  [name] [id number] DOB: 1950-03-06 DOA: 12/25/2018 PCP: [name]  Brief History   [name] is a 68 y.o. female with medical history significant of SBO; H TN; and chronic pain syndrome presenting with SOB.  She was here with PNA recently.  She was sent home from the ER and felt better after about a week.  The week after Thanksgiving she started not feeling well again and was seen.  PNA improved, sent home on medications.  This past week, she wasn't feeling as well.  [name] she was ok but she got worse and acutely SOB last night.  Her husband called 911 earlier this AM.  Intermittent SOB for the last month.  No known COVID contacts and they have been very careful.  SOB is with exertion, increased fatigue.  +orthpnea. +PND.  L leg swelling > R - started after the GI doctor started her on medication.  She gained about 6 pounds in about 2 weeks.  Patient has ongoing grief regarding the loss of her sons and her daughter is concerned this is contributing.   Found down, SOB.  EMS started BIPAP and gave NTG.  Significant tachypnea with wet rales.  Appears to be CHF exacerbation, BNP 2100, CXR with scattered pulmonary edema.  Vastly improved with BIPAP.  BD COVID negative, Hologic is pending.  Normal rectal temp.  Elevated lactate likely related to being down and respiratory distress.   The patient was admitted to a telemetry bed. She is being diuresed. Cardiology has been consulted. Echocardiogram was performed and demonstrated EF of 20-25% with Grade 1 diastolic dysfunction. There is a small pericardial effusion. There is elevated pulmonary artery systolic pressure.   Consultants  . Cardiology  Procedures  . None  Antibiotics   Anti-infectives (From admission, onward)   None     Subjective  The patient is resting comfortably. No new complaints. Complains of heaviness in her chest with walking.  Objective   Vitals:  Vitals:   12/28/18  0800 12/28/18 1403  BP: 97/75 106/63  Pulse: 73 75  Resp: 18 (!) 22  Temp: 98.1 F (36.7 C) 98.1 F (36.7 C)  SpO2: 96%    Exam:  Constitutional:  . The patient is awake, alert, and oriented x 3. No acute distress. Eyes:  . PERRLA . EOMBI . Normal lids and conjunctivae Neck:  . neck appears normal, no masses, normal ROM, supple . no thyromegaly . 9 cm JVD Respiratory:  . No increased work of breathing. . No wheezes or rhonchi . Positive for scattered rales . No tactile fremitus Cardiovascular:  . Regular rate and rhythm . No murmurs, ectopy, or gallups. . No lateral PMI. No thrills. Abdomen:  . Abdomen is soft, non-tender, non-distended . No hernias, masses, or organomegaly . Normoactive bowel sounds.  Musculoskeletal:  . No cyanosis, clubbing, or edema Skin:  . No rashes, lesions, ulcers . palpation of skin: no induration or nodules Neurologic:  . CN 2-12 intact . Sensation all 4 extremities intact Psychiatric:  . Mental status  Mood, affect appropriate  Orientation to person, place, time  . judgment and insight appear intact  I have personally reviewed the following:   Today's Data  . Vitals, CBC, BMP  Imaging  . CXR  Cardiology Data  . Echocardiogram  Scheduled Meds: . aspirin EC  81 mg Oral Daily  . atorvastatin  40 mg Oral q1800  . carvedilol  12.5 mg Oral BID WC  . enoxaparin (  LOVENOX) injection  30 mg Subcutaneous Q24H  . escitalopram  15 mg Oral QHS  . [START ON 12/29/2018] furosemide  80 mg Oral Daily  . influenza vaccine adjuvanted  0.5 mL Intramuscular Tomorrow-1000  . mirtazapine  15 mg Oral QHS  . pantoprazole  20 mg Oral Daily  . sacubitril-valsartan  1 tablet Oral BID  . sodium chloride flush  3 mL Intravenous Q12H   Continuous Infusions: . sodium chloride    . nitroGLYCERIN 3 mcg/min (12/27/18 0740)    Principal Problem:   Acute on chronic respiratory failure with hypoxia (HCC) Active Problems:   Essential hypertension    Depression, recurrent (HCC)   Acute heart failure (HCC)   Acute on chronic respiratory failure (HCC)   Hypertensive heart disease with heart failure (HCC)   AKI (acute kidney injury) ( [place])   LOS: 3 days   A & P  Acute on chronic respiratory failure with hypoxia associated with new-onset CHF: Pt has had a trial of antibiotics as outpatient. Will check procalcitonin. Cardiology has been consulted. The patient is receiving IV diuresis, Cozaar, ASA. Pt initially required BIPAP, but has been transitioned to nasal cannula, and now to room air with saturations in the 90's. Monitor.  Acute exacerbation of CHF:  EF 20% with Grade 1 diastolic dysfunction on echocardiogram. There was a markedly elevated BNP on admission. Diuresis with removal of 1 liter fluid since admission. CXR consistent with pulmonary edema. Cardiology has been consulted. The patient is receiving IV diuresis, Cozaar, ASA. Cardiology is planning for ischemic evaluation once CHF is improved. Procalcitonin, but patient is improving without IV antibiotics. She is afebrile and has only a slight leukocytosis. CXR this am demonstrates that edema is largely resolved.   Chest heaviness: Pt complains of chest heaviness with ambulation. Will discuss with cardiology.  Hypertension: BP control with NTG drip as per cardiology. Coreg 12.5 bid has been started with as needed IV lopresser. Continue Cozaar. Norvasc and HCTZ held.   Lactic acidosis: Elevated lactic likely due to hypoxia and CHF and not sepsis. Procalcitonin is pending.   AKI on CKD IIIa: Baseline creatinine is 0.94 with GFR of greater than 60. Creatinine is increased to 1.38 with GFR now reduced to 46. Check renal ultrasound. May continue to increase with diuresis. May be due to cardiorenal situation.   Hyperlipidemia: Start empiric Lipitor. LDL: 105, HDL 100   Depression:  Continue Lexapro and Remeron. Consider outpatient psych consult as long as she continues to endorse no SI;  otherwise consider inpatient consult.  I have seen and examined this patient myself. I have spent 34 minutes in her evaluation and care.    DVT prophylaxis: Lovenox  Code Status:  Full - confirmed with patient/daughter Family Communication: No family at bedside. Disposition Plan:  Home once clinically improved  Kaisy Severino, DO [facility] Direct contact: see [email or web address]  7PM-7AM contact night coverage as above 12/27/2018, 4:39 PM  LOS: 1 day

## 2018-12-29 DIAGNOSIS — J9621 Acute and chronic respiratory failure with hypoxia: Secondary | ICD-10-CM | POA: Diagnosis not present

## 2018-12-29 DIAGNOSIS — N179 Acute kidney failure, unspecified: Secondary | ICD-10-CM | POA: Diagnosis not present

## 2018-12-29 DIAGNOSIS — I5041 Acute combined systolic (congestive) and diastolic (congestive) heart failure: Secondary | ICD-10-CM | POA: Diagnosis not present

## 2018-12-29 LAB — CBC WITH DIFFERENTIAL/PLATELET
Abs Immature Granulocytes: 0.09 10*3/uL — ABNORMAL HIGH (ref 0.00–0.07)
Basophils Absolute: 0.1 10*3/uL (ref 0.0–0.1)
Basophils Relative: 1 %
Eosinophils Absolute: 0.2 10*3/uL (ref 0.0–0.5)
Eosinophils Relative: 3 %
HCT: 36.3 % (ref 36.0–46.0)
Hemoglobin: 12.4 g/dL (ref 12.0–15.0)
Immature Granulocytes: 1 %
Lymphocytes Relative: 51 %
Lymphs Abs: 3.9 10*3/uL (ref 0.7–4.0)
MCH: 34.2 pg — ABNORMAL HIGH (ref 26.0–34.0)
MCHC: 34.2 g/dL (ref 30.0–36.0)
MCV: 100 fL (ref 80.0–100.0)
Monocytes Absolute: 0.9 10*3/uL (ref 0.1–1.0)
Monocytes Relative: 12 %
Neutro Abs: 2.4 10*3/uL (ref 1.7–7.7)
Neutrophils Relative %: 32 %
Platelets: 331 10*3/uL (ref 150–400)
RBC: 3.63 MIL/uL — ABNORMAL LOW (ref 3.87–5.11)
RDW: 12 % (ref 11.5–15.5)
WBC: 7.5 10*3/uL (ref 4.0–10.5)
nRBC: 0 % (ref 0.0–0.2)

## 2018-12-29 LAB — BASIC METABOLIC PANEL
Anion gap: 10 (ref 5–15)
BUN: 42 mg/dL — ABNORMAL HIGH (ref 8–23)
CO2: 31 mmol/L (ref 22–32)
Calcium: 9.6 mg/dL (ref 8.9–10.3)
Chloride: 91 mmol/L — ABNORMAL LOW (ref 98–111)
Creatinine, Ser: 1.72 mg/dL — ABNORMAL HIGH (ref 0.44–1.00)
GFR calc Af Amer: 35 mL/min — ABNORMAL LOW (ref >60)
GFR calc non Af Amer: 30 mL/min — ABNORMAL LOW (ref >60)
Glucose, Bld: 122 mg/dL — ABNORMAL HIGH (ref 70–99)
Potassium: 4.1 mmol/L (ref 3.5–5.1)
Sodium: 132 mmol/L — ABNORMAL LOW (ref 135–145)

## 2018-12-29 NOTE — Progress Notes (Signed)
Progress Note  Patient Name: [name] Date of Encounter: 12/29/2018  Primary Cardiologist: No primary care provider on file.   Subjective   Feeling much better.  Breathing continues to improve and abdomen no longer distended.  Feeling tired today.   Inpatient Medications    Scheduled Meds: . aspirin EC  81 mg Oral Daily  . atorvastatin  40 mg Oral q1800  . carvedilol  12.5 mg Oral BID WC  . enoxaparin (LOVENOX) injection  30 mg Subcutaneous Q24H  . escitalopram  15 mg Oral QHS  . influenza vaccine adjuvanted  0.5 mL Intramuscular Tomorrow-1000  . mirtazapine  15 mg Oral QHS  . pantoprazole  20 mg Oral Daily  . sacubitril-valsartan  1 tablet Oral BID  . sodium chloride flush  3 mL Intravenous Q12H   Continuous Infusions: . sodium chloride    . nitroGLYCERIN 3 mcg/min (12/27/18 0740)   PRN Meds: sodium chloride, acetaminophen, diphenoxylate-atropine, hyoscyamine, ondansetron (ZOFRAN) IV, sodium chloride flush   Vital Signs    Vitals:   12/28/18 1711 12/28/18 2100 12/29/18 0453 12/29/18 0837  BP: 129/81 115/74 109/78 122/70  Pulse: 72 76 73 69  Resp: 16 19 15 17   Temp:  98.1 F (36.7 C) 98 F (36.7 C) 98.1 F (36.7 C)  TempSrc:  Oral Oral Oral  SpO2: 98% 99% 99%   Weight:   48.4 kg   Height:       No intake or output data in the 24 hours ending 12/29/18 1022 Last 3 Weights 12/29/2018 12/28/2018 12/27/2018  Weight (lbs) 106 lb 11.2 oz 102 lb 6.4 oz 106 lb 7.7 oz  Weight (kg) 48.399 kg 46.448 kg 48.3 kg      Telemetry    Sinus rhythm.  No events. - Personally Reviewed  ECG    Sinus rhythm.  Rate 80 bpm.  LVH with repolarization abnormalities.  QTc 525 ms.  - Personally Reviewed  Physical Exam   VS:  BP 122/70 (BP Location: Left Arm)   Pulse 69   Temp 98.1 F (36.7 C) (Oral)   Resp 17   Ht 4\' 11"  (1.499 m)   Wt 48.4 kg   SpO2 99%   BMI 21.55 kg/m  , BMI Body mass index is 21.55 kg/m. GENERAL:  Well appearing HEENT: Pupils equal round and  reactive, fundi not visualized, oral mucosa unremarkable NECK:  no jugular venous distention, waveform within normal limits, carotid upstroke brisk and symmetric, no bruits LUNGS:  Clear to auscultation bilaterally HEART:  RRR.  PMI not displaced or sustained,S1 and S2 within normal limits, no S3, no S4, no clicks, no rubs, no murmurs ABD:  Flat, positive bowel sounds normal in frequency in pitch, no bruits, no rebound, no guarding, no midline pulsatile mass, no hepatomegaly, no splenomegaly EXT:  2 plus pulses throughout, no edema, no cyanosis no clubbing SKIN:  No rashes no nodules NEURO:  Cranial nerves II through XII grossly intact, motor grossly intact throughout [facility]:  Cognitively intact, oriented to person place and time   Labs    High Sensitivity Troponin:   Recent Labs  Lab 12/25/18 0922 12/25/18 1133  TROPONINIHS 13 39*      Chemistry Recent Labs  Lab 12/25/18 0922  12/27/18 0304 12/28/18 0315 12/29/18 0214  NA 133*   < > 134* 136 132*  K 3.1*   < > 3.3* 3.7 4.1  CL 99   < > 95* 92* 91*  CO2 19*   < > 28  32 31  GLUCOSE 328*   < > 97 127* 122*  BUN 24*   < > 39* 35* 42*  CREATININE 1.32*   < > 1.18* 1.38* 1.72*  CALCIUM 9.5   < > 9.1 9.6 9.6  PROT 7.5  --   --   --   --   ALBUMIN 3.8  --   --   --   --   AST 28  --   --   --   --   ALT 17  --   --   --   --   ALKPHOS 69  --   --   --   --   BILITOT 0.5  --   --   --   --   GFRNONAA 42*   < > 48* 39* 30*  GFRAA 48*   < > 55* 46* 35*  ANIONGAP 15   < > 11 12 10    < > = values in this interval not displayed.     Hematology Recent Labs  Lab 12/25/18 0922 12/25/18 2351 12/29/18 0214  WBC 11.5* 11.0* 7.5  RBC 3.79* 3.11* 3.63*  HGB 12.9 10.6* 12.4  HCT 39.2 31.3* 36.3  MCV 103.4* 100.6* 100.0  MCH 34.0 34.1* 34.2*  MCHC 32.9 33.9 34.2  RDW 12.4 12.7 12.0  PLT 357 286 331    BNP Recent Labs  Lab 12/25/18 0922  BNP 2,159.0*     DDimer No results for input(s): DDIMER in the last 168 hours.    Radiology    Dg Chest 2 View  Result Date: 12/28/2018 CLINICAL DATA:  Congestive heart failure. Hypoxia. EXAM: CHEST - 2 VIEW COMPARISON:  12/25/2018 FINDINGS: Heart size is mildly enlarged. Chronic atherosclerosis and tortuosity of the aorta. Resolution of interstitial and alveolar edema pattern. No effusion. Lungs well aerated presently. No significant bone finding. IMPRESSION: Resolution of interstitial and alveolar edema pattern. Mild cardiac enlargement. No active process presently. Electronically Signed   By: Nelson Chimes M.D.   On: 12/28/2018 08:26   Mr Cardiac Morphology W Wo Contrast  Result Date: 12/27/2018 CLINICAL DATA:  68 year old female with new diagnosis of combined systolic and diastolic CHF. EXAM: CARDIAC MRI TECHNIQUE: The patient was scanned on a 1.5 Tesla GE magnet. A dedicated cardiac coil was used. Functional imaging was done using Fiesta sequences. 2,3, and 4 chamber views were done to assess for RWMA's. Modified Simpson's rule using a short axis stack was used to calculate an ejection fraction on a dedicated work Conservation officer, nature. The patient received 7 cc of Gadavist. After 10 minutes inversion recovery sequences were used to assess for infiltration and scar tissue. CONTRAST:  7 cc  of Gadavist FINDINGS: 1. Normal left ventricular size with moderate concentric hypertrophy and severely impaired systolic function (LVEF = 31%). Diffuse hypokinesis. There are focal gadolinium enhancements at the attachment points of the right ventricle to the left ventricle consistent with fluid overload. LVEDD: 49 mm LVESD: 38 mm LVEDV: 87 ml LVESV: 60 ml SV: 26 ml CO: 11.98 L/min Myocardial mass: 141 g 2. Normal right ventricular size, thickness and systolic function (LVEF = 49%). There are no regional wall motion abnormalities. RVEDV: 53 ml RVESV: 27 ml SV: 26 ml 3.  Normal left and right atrial size. 4. Normal size of the aortic root, ascending aorta. Mildly dilated pulmonary artery  measuring 32 mm. 5.  Mild mitral and tricuspid regurgitation. 6.  Normal pericardium.  Mild circumferential pericardial effusion. IMPRESSION: 1.  Normal left ventricular size with moderate concentric hypertrophy and severely impaired systolic function (LVEF = 31%) with diffuse hypokinesis. There are focal gadolinium enhancements at the attachment points of the right ventricle to the left ventricle consistent with fluid overload. Native T1 1085 ms, Post contrast T1 255 ms, ECV 28%. These findings are consistent with hypertensive heart disease with CHF. 2. Normal right ventricular size, thickness and systolic function (LVEF = 49%). There are no regional wall motion abnormalities. 3. Mildly dilated pulmonary artery measuring 32 mm. 4. Mild mitral and tricuspid regurgitation. 5. Mild circumferential pericardial effusion. Electronically Signed   By: Ena Dawley   On: 12/27/2018 22:46    Cardiac Studies   Echo 12/25/18: 1. Left ventricular ejection fraction, by visual estimation, is 20 to 25%. The left ventricle has severely decreased function. There is moderately increased left ventricular hypertrophy.  2. Left ventricular diastolic parameters are consistent with Grade I diastolic dysfunction (impaired relaxation).  3. Global right ventricle has normal systolic function.The right ventricular size is normal. No increase in right ventricular wall thickness.  4. Left atrial size was mildly dilated.  5. Right atrial size was normal.  6. Small pericardial effusion.  7. The pericardial effusion is posterior to the left ventricle.  8. The mitral valve is normal in structure. Mild mitral valve regurgitation. No evidence of mitral stenosis.  9. The tricuspid valve is normal in structure. Tricuspid valve regurgitation is mild. 10. The aortic valve is normal in structure. Aortic valve regurgitation is not visualized. No evidence of aortic valve sclerosis or stenosis. 11. The pulmonic valve was normal in  structure. Pulmonic valve regurgitation is not visualized. 12. Mildly elevated pulmonary artery systolic pressure. 13. The tricuspid regurgitant velocity is 2.71 m/s, and with an assumed right atrial pressure of 8 mmHg, the estimated right ventricular systolic pressure is mildly elevated at 37.4 mmHg. 14. The inferior vena cava is dilated in size with >50% respiratory variability, suggesting right atrial pressure of 8 mmHg.  Cardiac MRI 12/27/18: IMPRESSION: 1. Normal left ventricular size with moderate concentric hypertrophy and severely impaired systolic function (LVEF = 31%) with diffuse hypokinesis.  There are focal gadolinium enhancements at the attachment points of the right ventricle to the left ventricle consistent with fluid overload.  Native T1 1085 ms, Post contrast T1 255 ms, ECV 28%. These findings are consistent with hypertensive heart disease with CHF.  2. Normal right ventricular size, thickness and systolic function (LVEF = 49%). There are no regional wall motion abnormalities.  3. Mildly dilated pulmonary artery measuring 32 mm.  4. Mild mitral and tricuspid regurgitation.  5. Mild circumferential pericardial effusion.    Patient Profile     68 y.o. female with hypertension and family history of premature CAD and HF admitted with acute systolic and diastolic heart failure.   Assessment & Plan    # Acute systolic and diastolic heart failure: # Essential hypertension:  LVEF 20-25%.  Moderate LVH on echo.  Cardiac MRI did not show an infiltrative cardiomyopathy and was more consistent with hypertensive cardiomyopathy.  She is still a little volume overloaded but much better.  She is now overdiuresed and renal function is worse.  Will hold lasix today.  Reduced Entresto 2/2 hypotension and BP is now stable.  Plan to get coronary CT-A once renal function stabilizes.  Depending on her creatinine tomorrow this may be done as an outpatient.   # Acute hypoxic  heart failure: Now off BiPAP.  Resolved.   # AKI:  Initially improved and now worse in the setting of diuresis.  Holding diuretic as above.   # Hyperlipidemia:  Atorvastatin was started this admission.     For questions or updates, please contact [place] Please consult [email or web address] for contact info under        Signed, Skeet Latch, MD  12/29/2018, 10:22 AM

## 2018-12-29 NOTE — Progress Notes (Signed)
PROGRESS NOTE  [name] [id number] DOB: 1950-12-29 DOA: 12/25/2018 PCP: [name], Julia Halsted, MD  Brief History   [name] is a 68 y.o. female with medical history significant of SBO; H TN; and chronic pain syndrome presenting with SOB.  She was here with PNA recently.  She was sent home from the ER and felt better after about a week.  The week after Thanksgiving she started not feeling well again and was seen.  PNA improved, sent home on medications.  This past week, she wasn't feeling as well.  Laural Buchanan she was ok but she got worse and acutely SOB last night.  Her husband called 911 earlier this AM.  Intermittent SOB for the last month.  No known COVID contacts and they have been very careful.  SOB is with exertion, increased fatigue.  +orthpnea. +PND.  L leg swelling > R - started after the GI doctor started her on medication.  She gained about 6 pounds in about 2 weeks.  Patient has ongoing grief regarding the loss of her sons and her daughter is concerned this is contributing.   Found down, SOB.  EMS started BIPAP and gave NTG.  Significant tachypnea with wet rales.  Appears to be CHF exacerbation, BNP 2100, CXR with scattered pulmonary edema.  Vastly improved with BIPAP.  BD COVID negative, Hologic is pending.  Normal rectal temp.  Elevated lactate likely related to being down and respiratory distress.   The patient was admitted to a telemetry bed. She is being diuresed. Cardiology has been consulted. Echocardiogram was performed and demonstrated EF of 20-25% with Grade 1 diastolic dysfunction. There is a small pericardial effusion. There is elevated pulmonary artery systolic pressure.   Consultants  . Cardiology  Procedures  . None  Antibiotics   Anti-infectives (From admission, onward)   None     Subjective  The patient is resting comfortably. No new complaints.   Objective   Vitals:  Vitals:   12/29/18 0837 12/29/18 1141  BP: 122/70 119/79  Pulse: 69  84  Resp: 17 20  Temp: 98.1 F (36.7 C) (!) 97.5 F (36.4 C)  SpO2:  100%   Exam:  Constitutional:  . The patient is awake, alert, and oriented x 3. No acute distress. Eyes:  . PERRLA . EOMBI . Normal lids and conjunctivae Neck:  . neck appears normal, no masses, normal ROM, supple . no thyromegaly . 9 cm JVD Respiratory:  . No increased work of breathing. . No wheezes or rhonchi . Positive for scattered rales . No tactile fremitus Cardiovascular:  . Regular rate and rhythm . No murmurs, ectopy, or gallups. . No lateral PMI. No thrills. Abdomen:  . Abdomen is soft, non-tender, non-distended . No hernias, masses, or organomegaly . Normoactive bowel sounds.  Musculoskeletal:  . No cyanosis, clubbing, or edema Skin:  . No rashes, lesions, ulcers . palpation of skin: no induration or nodules Neurologic:  . CN 2-12 intact . Sensation all 4 extremities intact Psychiatric:  . Mental status  Mood, affect appropriate  Orientation to person, place, time  . judgment and insight appear intact  I have personally reviewed the following:   Today's Data  . Vitals, CBC, BMP  Imaging  . CXR  Cardiology Data  . Echocardiogram  Scheduled Meds: . aspirin EC  81 mg Oral Daily  . atorvastatin  40 mg Oral q1800  . carvedilol  12.5 mg Oral BID WC  . enoxaparin (LOVENOX) injection  30 mg Subcutaneous Q24H  .  escitalopram  15 mg Oral QHS  . influenza vaccine adjuvanted  0.5 mL Intramuscular Tomorrow-1000  . mirtazapine  15 mg Oral QHS  . pantoprazole  20 mg Oral Daily  . sacubitril-valsartan  1 tablet Oral BID  . sodium chloride flush  3 mL Intravenous Q12H   Continuous Infusions: . sodium chloride    . nitroGLYCERIN 3 mcg/min (12/27/18 0740)    Principal Problem:   Acute on chronic respiratory failure with hypoxia (HCC) Active Problems:   Essential hypertension   Depression, recurrent (HCC)   Acute heart failure (HCC)   Acute on chronic respiratory failure (HCC)    Hypertensive heart disease with heart failure (HCC)   AKI (acute kidney injury) ([facility])   LOS: 4 days   A & P  Acute on chronic respiratory failure with hypoxia associated with new-onset CHF: Pt has had a trial of antibiotics as outpatient. Will check procalcitonin. Cardiology has been consulted. The patient is receiving IV diuresis, Cozaar, ASA. Pt initially required BIPAP, but has been transitioned to nasal cannula, and now to room air with saturations in the high 90's. Monitor.  Acute exacerbation of CHF:  EF 20% with Grade 1 diastolic dysfunction on echocardiogram. There was a markedly elevated BNP on admission. Diuresis with removal of 1 liter fluid since admission. CXR consistent with pulmonary edema. Cardiology has been consulted. The patient is receiving IV diuresis, Cozaar, ASA. Cardiology is planning for ischemic evaluation once CHF is improved. Procalcitonin, but patient is improving without IV antibiotics. She is afebrile and has only a slight leukocytosis. CXR this am demonstrates that edema is largely resolved. She is 2 liters negative by nursing documentation of I's and O's.  Chest heaviness: Pt complains of chest heaviness with ambulation. Will discuss with cardiology.  Hypertension: BP control with NTG drip as per cardiology. Coreg 12.5 bid has been started with as needed IV lopresser. Continue Cozaar. Norvasc and HCTZ held.   Lactic acidosis: Elevated lactic likely due to hypoxia and CHF and not sepsis. Procalcitonin is pending.   AKI on CKD IIIa: Baseline creatinine is 0.94 with GFR of greater than 60. Creatinine is increased to 1.38 with GFR now reduced to 46. Check renal ultrasound. May continue to increase with diuresis. May be due to cardiorenal situation.   Hyperlipidemia: Start empiric Lipitor. LDL: 105, HDL 100   Depression:  Continue Lexapro and Remeron. Consider outpatient psych consult as long as she continues to endorse no SI; otherwise consider inpatient consult.   I have seen and examined this patient myself. I have spent 34 minutes in her evaluation and care.    DVT prophylaxis: Lovenox  Code Status:  Full - confirmed with patient/daughter Family Communication: No family at bedside. Disposition Plan:  Home once clinically improved  Anetta Olvera, DO [facility] Direct contact: see [email or web address]  7PM-7AM contact night coverage as above 12/29/2018, 4:04 PM  LOS: 1 day

## 2018-12-29 NOTE — Care Management Important Message (Signed)
Important Message  Patient Details  Name: [name] MRN: [id number] Date of Birth: Sep 08, 1950   Medicare Important Message Given:  Yes     Shelda Altes 12/29/2018, 12:50 PM

## 2018-12-30 ENCOUNTER — Inpatient Hospital Stay (HOSPITAL_COMMUNITY): Payer: Medicare Other

## 2018-12-30 ENCOUNTER — Telehealth: Payer: Self-pay | Admitting: Cardiology

## 2018-12-30 DIAGNOSIS — R079 Chest pain, unspecified: Secondary | ICD-10-CM

## 2018-12-30 DIAGNOSIS — I209 Angina pectoris, unspecified: Secondary | ICD-10-CM

## 2018-12-30 DIAGNOSIS — I5021 Acute systolic (congestive) heart failure: Secondary | ICD-10-CM

## 2018-12-30 LAB — BASIC METABOLIC PANEL
Anion gap: 10 (ref 5–15)
BUN: 35 mg/dL — ABNORMAL HIGH (ref 8–23)
CO2: 29 mmol/L (ref 22–32)
Calcium: 9 mg/dL (ref 8.9–10.3)
Chloride: 90 mmol/L — ABNORMAL LOW (ref 98–111)
Creatinine, Ser: 1.11 mg/dL — ABNORMAL HIGH (ref 0.44–1.00)
GFR calc Af Amer: 60 mL/min — ABNORMAL LOW (ref >60)
GFR calc non Af Amer: 51 mL/min — ABNORMAL LOW (ref >60)
Glucose, Bld: 100 mg/dL — ABNORMAL HIGH (ref 70–99)
Potassium: 3.1 mmol/L — ABNORMAL LOW (ref 3.5–5.1)
Sodium: 129 mmol/L — ABNORMAL LOW (ref 135–145)

## 2018-12-30 MED ORDER — IOHEXOL 350 MG/ML SOLN
100.0000 mL | Freq: Once | INTRAVENOUS | Status: AC | PRN
  Administered 2018-12-30: 100 mL via INTRAVENOUS

## 2018-12-30 MED ORDER — ATORVASTATIN CALCIUM 40 MG PO TABS: 40.0000 mg | ORAL_TABLET | Freq: Every day | ORAL | 0 refills | Status: DC

## 2018-12-30 MED ORDER — NITROGLYCERIN 0.4 MG SL SUBL
SUBLINGUAL_TABLET | SUBLINGUAL | Status: AC
  Filled 2018-12-30: qty 2

## 2018-12-30 MED ORDER — CARVEDILOL 12.5 MG PO TABS: 12.5000 mg | ORAL_TABLET | Freq: Two times a day (BID) | ORAL | 0 refills | Status: DC

## 2018-12-30 MED ORDER — SACUBITRIL-VALSARTAN 24-26 MG PO TABS: 1.0000 | ORAL_TABLET | Freq: Two times a day (BID) | ORAL | 0 refills | Status: DC

## 2018-12-30 MED ORDER — ASPIRIN 81 MG PO TBEC: 81.0000 mg | DELAYED_RELEASE_TABLET | Freq: Every day | ORAL | 0 refills | Status: DC

## 2018-12-30 MED ORDER — METOPROLOL TARTRATE 50 MG PO TABS
50.0000 mg | ORAL_TABLET | Freq: Once | ORAL | Status: AC
  Administered 2018-12-30: 50 mg via ORAL
  Filled 2018-12-30: qty 1

## 2018-12-30 MED ORDER — NITROGLYCERIN 0.4 MG SL SUBL
0.8000 mg | SUBLINGUAL_TABLET | Freq: Once | SUBLINGUAL | Status: AC
  Administered 2018-12-30: 0.8 mg via SUBLINGUAL

## 2018-12-30 NOTE — Discharge Summary (Signed)
Physician Discharge Summary  [name] [id number] DOB: July 27, 1950 DOA: 12/25/2018  PCP: Isaac Bliss, Rayford Halsted, MD  Admit date: 12/25/2018 Discharge date: 12/30/2018  Recommendations for Outpatient Follow-up:  Follow up with PCP in 7-10 days. Follow up with cardiology in 1 month. BMP drawn in one week and reported to PCP. Weigh yourself daily and record weights.  Discharge Diagnoses: Principal diagnosis is #1 1. Acute hypoxic respiratory failure 2. 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 3. Aspiration pneumonia 4. Hyperlipidemia 5. Hypertension 6. Pulmonary edema  Discharge Condition: Fair  Disposition: Home  Diet recommendation: Heart healthy  Filed Weights   12/28/18 0538 12/29/18 0453 12/30/18 0424  Weight: 46.4 kg 48.4 kg 49.3 kg    History of present illness:  [name] is a 68 y.o. female with medical history significant of SBO; H TN; and chronic pain syndrome presenting with SOB.  She was here with PNA recently.  She was sent home from the ER and felt better after about a week.  The week after Thanksgiving she started not feeling well again and was seen.  PNA improved, sent home on medications.  This past week, she wasn't feeling as well.  [name] she was ok but she got worse and acutely SOB last night.  Her husband called 911 earlier this AM.  Intermittent SOB for the last month.  No known COVID contacts and they have been very careful.  SOB is with exertion, increased fatigue.  +orthpnea. +PND.  L leg swelling > R - started after the GI doctor started her on medication.  She gained about 6 pounds in about 2 weeks.  Patient has ongoing grief regarding the loss of her sons and her daughter is concerned this is contributing.  Found down, SOB.  EMS started BIPAP and gave NTG.  Significant tachypnea with wet rales.  Appears to be CHF exacerbation, BNP 2100, CXR with scattered pulmonary edema.  Vastly  improved with BIPAP.  BD COVID negative, Hologic is pending.  Normal rectal temp.  Elevated lactate likely related to being down and respiratory distress.   Hospital Course:  [name] a 68 y.o.femalewith medical history significant ofSBO; H TN; and chronic pain syndrome presenting with SOB.She was here with PNA recently. She was sent home from the ER and felt better after about a week. The week after Thanksgiving she started not feeling well again and was seen. PNA improved, sent home on medications. This past week, she wasn't feeling as well. [name] she was ok but she got worse and acutely SOB last night. Her husband called 911 earlier this AM. Intermittent SOB for the last month. No known COVID contacts and they have been very careful. SOB is with exertion, increased fatigue. +orthpnea. +PND. L leg swelling >R - started after the GI doctor started her on medication. She gained about 6 pounds in about 2 weeks. Patient has ongoing grief regarding the loss of her sons and her daughter is concerned this is contributing.  Found down, SOB. EMS started BIPAP and gave NTG. Significant tachypnea with wet rales. Appears to be CHF exacerbation, BNP 2100, CXR with scattered pulmonary edema. Vastly improved with BIPAP. BD COVID negative, Hologic is pending. Normal rectal temp. Elevated lactate likely related to being down and respiratory distress.   The patient was admitted to a telemetry bed. She is being diuresed. Cardiology has been consulted. Echocardiogram was performed and demonstrated EF of 20-25% with Grade 1 diastolic dysfunction. There is  a small pericardial effusion. There is elevated pulmonary artery systolic pressure. She has been diuresed and returned to her baseline respiratory status.   The patient had complaints of chest heaviness with ambulation. Dr. Oval Linsey performed a Coronary CTA which did not show CAD. Cardiology has cleared the patient for discharge to  home.  Today's assessment: S: The patient is resting comfortably. No new complaints. O: Vitals:  Vitals:   12/30/18 0923 12/30/18 1236  BP: 115/86 113/70  Pulse: 75 71  Resp: 15 17  Temp: 97.9 F (36.6 C) 98 F (36.7 C)  SpO2: 100% 100%    Exam:  Constitutional:  . The patient is awake, alert, and oriented x 3. No acute distress. Respiratory:  . No increased work of breathing. . No wheezes, rales, or rhonchi . No tactile fremitus Cardiovascular:  . Regular rate and rhythm . No murmurs, ectopy, or gallups. . No lateral PMI. No thrills. Abdomen:  . Abdomen is soft, non-tender, non-distended . No hernias, masses, or organomegaly . Normoactive bowel sounds.  Musculoskeletal:  . No cyanosis, clubbing, or edema Skin:  . No rashes, lesions, ulcers . palpation of skin: no induration or nodules Neurologic:  . CN 2-12 intact . Sensation all 4 extremities intact Psychiatric:  . Mental status o Mood, affect appropriate o Orientation to person, place, time  . judgment and insight appear intact  Discharge Instructions  Discharge Instructions    (HEART FAILURE PATIENTS) Call MD:  Anytime you have any of the following symptoms: 1) 3 pound weight gain in 24 hours or 5 pounds in 1 week 2) shortness of breath, with or without a dry hacking cough 3) swelling in the hands, feet or stomach 4) if you have to sleep on extra pillows at night in order to breathe.   Complete by: As directed    Activity as tolerated - No restrictions   Complete by: As directed    Call MD for:  difficulty breathing, headache or visual disturbances   Complete by: As directed    Call MD for:  extreme fatigue   Complete by: As directed    Diet - low sodium heart healthy   Complete by: As directed    Discharge instructions   Complete by: As directed    Follow up with PCP in 7-10 days. Follow up with cardiology in 1 month. BMP drawn in one week and reported to PCP. Weigh yourself daily and record  weights.   Increase activity slowly   Complete by: As directed      Allergies as of 12/30/2018      Reactions   Morphine And Related Other (See Comments)   Headache      Medication List    STOP taking these medications   losartan-hydrochlorothiazide 100-25 MG tablet Commonly known as: HYZAAR     TAKE these medications   aspirin 81 MG EC tablet Take 1 tablet (81 mg total) by mouth daily. Start taking on: December 31, 2018   atorvastatin 40 MG tablet Commonly known as: LIPITOR Take 1 tablet (40 mg total) by mouth daily at 6 PM.   carvedilol 12.5 MG tablet Commonly known as: COREG Take 1 tablet (12.5 mg total) by mouth 2 (two) times daily with a meal. Start taking on: December 31, 2018   diphenoxylate-atropine 2.5-0.025 MG tablet Commonly known as: Lomotil Take 1 tablet by mouth 2 (two) times daily as needed for diarrhea or loose stools.   escitalopram 10 MG tablet Commonly known as: Lexapro 1-1/2  tablets daily at bedtime What changed:   how much to take  how to take this  when to take this  additional instructions   hyoscyamine 0.125 MG SL tablet Commonly known as: LEVSIN SL PLACE 1 TABLET (0.125 MG TOTAL) UNDER THE TONGUE AS NEEDED. AS NEEDED FOR SPASM What changed:   when to take this  reasons to take this  additional instructions   mirtazapine 15 MG tablet Commonly known as: Remeron Take 1 tablet (15 mg total) by mouth at bedtime.   pantoprazole 20 MG tablet Commonly known as: PROTONIX Take 1 tablet (20 mg total) by mouth daily.   sacubitril-valsartan 24-26 MG Commonly known as: ENTRESTO Take 1 tablet by mouth 2 (two) times daily.      Allergies  Allergen Reactions  . Morphine And Related Other (See Comments)    Headache     The results of significant diagnostics from this hospitalization (including imaging, microbiology, ancillary and laboratory) are listed below for reference.    Significant Diagnostic Studies: DG Chest 2  View  Result Date: 12/28/2018 CLINICAL DATA:  Congestive heart failure. Hypoxia. EXAM: CHEST - 2 VIEW COMPARISON:  12/25/2018 FINDINGS: Heart size is mildly enlarged. Chronic atherosclerosis and tortuosity of the aorta. Resolution of interstitial and alveolar edema pattern. No effusion. Lungs well aerated presently. No significant bone finding. IMPRESSION: Resolution of interstitial and alveolar edema pattern. Mild cardiac enlargement. No active process presently. Electronically Signed   By: Nelson Chimes M.D.   On: 12/28/2018 08:26   CT CORONARY MORPH W/CTA COR W/SCORE W/CA W/CM &/OR WO/CM  Addendum Date: 12/30/2018   ADDENDUM REPORT: 12/30/2018 16:07 CLINICAL DATA:  23F with acute systolic heart failure and chest pain. EXAM: Cardiac/Coronary  CT TECHNIQUE: The patient was scanned on a [employer]. FINDINGS: A 120 kV prospective scan was triggered in the descending thoracic aorta at 111 HU's. Axial non-contrast 3 mm slices were carried out through the heart. The data set was analyzed on a dedicated work station and scored using the [place]. Gantry rotation speed was 250 msecs and collimation was .6 mm. No beta blockade and 0.8 mg of sl NTG was given. The 3D data set was reconstructed in 5% intervals of the 67-82 % of the R-R cycle. Diastolic phases were analyzed on a dedicated work station using MPR, MIP and VRT modes. The patient received 80 cc of contrast. Aorta: Normal size. Ascending aorta 2.9 cm. No calcifications. No dissection. Aortic Valve:  Trileaflet.  No calcifications. Coronary Arteries:  Normal coronary origin.  Right dominance. RCA is a large dominant artery that gives rise to PDA and two PLV branches. There is no plaque. Left main is a large artery that gives rise to LAD, LCX, and RI arteries. There is minimal (<25%) calcified plaque distally. LAD is a large vessel that has no plaque. There is a small D1, small D2, normal D3 and D4 without plaque. LCX is a non-dominant artery  that gives rise to a small OM1 and large OMs branches. There is no plaque. RI is a large, branching vessel without plaque. One of the RI branches forms a long, myocardial bridge. Other findings: Normal pulmonary vein drainage into the left atrium. Normal let atrial appendage without a thrombus. Normal size of the pulmonary artery. IMPRESSION: 1. Coronary calcium score of 12.4. This was 65th percentile for age and sex matched control. However, this calcium could not be correlated on coronary CT-A. Suspect artifact. 2. Normal coronary origin with right dominance. 3. No  evidence of CAD. Skeet Latch, MD Electronically Signed   By: Skeet Latch   On: 12/30/2018 16:07   Result Date: 12/30/2018 EXAM: OVER-READ INTERPRETATION  CT CHEST The following report is an over-read performed by radiologist Dr. Vinnie Buchanan of [facility], [place] on 12/30/2018. This over-read does not include interpretation of cardiac or coronary anatomy or pathology. The coronary calcium score/coronary CTA interpretation by the cardiologist is attached. COMPARISON:  None. FINDINGS: Aortic atherosclerosis. 3 mm nodule in the inferior segment of the lingula (axial image 24 of series 12), stable compared to prior CT the abdomen and pelvis 03/11/2012, considered definitively benign. Within the visualized portions of the thorax there are no suspicious appearing pulmonary nodules or masses, there is no acute consolidative airspace disease, no pleural effusions, no pneumothorax and no lymphadenopathy. Visualized portions of the upper abdomen are unremarkable. There are no aggressive appearing lytic or blastic lesions noted in the visualized portions of the skeleton. IMPRESSION: 1.  Aortic Atherosclerosis (ICD10-I70.0). Electronically Signed: By: Vinnie Buchanan M.D. On: 12/30/2018 15:17   DG Chest Portable 1 View  Result Date: 12/25/2018 CLINICAL DATA:  Shortness of breath. EXAM: PORTABLE CHEST 1 VIEW COMPARISON:  11/29/2018  FINDINGS: Heart size is stable and mildly enlarged. Central airways are patent. Increased interstitial markings with similar pattern but with more pronounced changes in the right lower lobe since the previous exam. Small amount of fluid likely present in the fissure the right. No acute bone findings are noted. Leads project over the anterior chest. IMPRESSION: 1. Increasing interstitial prominence may represent pneumonitis from viral or atypical infection or asymmetric pulmonary edema. 2. Stable mild cardiomegaly. Electronically Signed   By: Zetta Bills M.D.   On: 12/25/2018 09:47   MR CARDIAC MORPHOLOGY W WO CONTRAST  Result Date: 12/27/2018 CLINICAL DATA:  68 year old female with new diagnosis of combined systolic and diastolic CHF. EXAM: CARDIAC MRI TECHNIQUE: The patient was scanned on a 1.5 Tesla GE magnet. A dedicated cardiac coil was used. Functional imaging was done using [employer] sequences. 2,3, and 4 chamber views were done to assess for RWMA's. Modified Simpson's rule using a short axis stack was used to calculate an ejection fraction on a dedicated work Conservation officer, nature. The patient received 7 cc of Gadavist. After 10 minutes inversion recovery sequences were used to assess for infiltration and scar tissue. CONTRAST:  7 cc  of Gadavist FINDINGS: 1. Normal left ventricular size with moderate concentric hypertrophy and severely impaired systolic function (LVEF = 31%). Diffuse hypokinesis. There are focal gadolinium enhancements at the attachment points of the right ventricle to the left ventricle consistent with fluid overload. LVEDD: 49 mm LVESD: 38 mm LVEDV: 87 ml LVESV: 60 ml SV: 26 ml CO: 11.98 L/min Myocardial mass: 141 g 2. Normal right ventricular size, thickness and systolic function (LVEF = 49%). There are no regional wall motion abnormalities. RVEDV: 53 ml RVESV: 27 ml SV: 26 ml 3.  Normal left and right atrial size. 4. Normal size of the aortic root, ascending aorta. Mildly  dilated pulmonary artery measuring 32 mm. 5.  Mild mitral and tricuspid regurgitation. 6.  Normal pericardium.  Mild circumferential pericardial effusion. IMPRESSION: 1. Normal left ventricular size with moderate concentric hypertrophy and severely impaired systolic function (LVEF = 31%) with diffuse hypokinesis. There are focal gadolinium enhancements at the attachment points of the right ventricle to the left ventricle consistent with fluid overload. Native T1 1085 ms, Post contrast T1 255 ms, ECV 28%. These findings are consistent  with hypertensive heart disease with CHF. 2. Normal right ventricular size, thickness and systolic function (LVEF = 49%). There are no regional wall motion abnormalities. 3. Mildly dilated pulmonary artery measuring 32 mm. 4. Mild mitral and tricuspid regurgitation. 5. Mild circumferential pericardial effusion. Electronically Signed   By: Ena Dawley   On: 12/27/2018 22:46   ECHOCARDIOGRAM COMPLETE  Result Date: 12/25/2018   ECHOCARDIOGRAM REPORT   Patient Name:   [name] Date of Exam: 12/25/2018 Medical Rec #:  [id number]      Height:       59.0 in Accession #:    [id number]     Weight:       98.4 lb Date of Birth:  Oct 09, 1950     BSA:          1.36 m Patient Age:    77 years       BP:           170/95 mmHg Patient Gender: F              HR:           86 bpm. Exam Location:  Inpatient Procedure: 2D Echo STAT ECHO Indications:    CHF  History:        Patient has no prior history of Echocardiogram examinations.                 Signs/Symptoms:Dyspnea and Shortness of Breath; Risk                 Factors:Family History of Coronary Artery Disease.  Sonographer:    Johny Chess Referring Phys: [id number] [place]  1. Left ventricular ejection fraction, by visual estimation, is 20 to 25%. The left ventricle has severely decreased function. There is moderately increased left ventricular hypertrophy.  2. Left ventricular diastolic parameters are consistent  with Grade I diastolic dysfunction (impaired relaxation).  3. Global right ventricle has normal systolic function.The right ventricular size is normal. No increase in right ventricular wall thickness.  4. Left atrial size was mildly dilated.  5. Right atrial size was normal.  6. Small pericardial effusion.  7. The pericardial effusion is posterior to the left ventricle.  8. The mitral valve is normal in structure. Mild mitral valve regurgitation. No evidence of mitral stenosis.  9. The tricuspid valve is normal in structure. Tricuspid valve regurgitation is mild. 10. The aortic valve is normal in structure. Aortic valve regurgitation is not visualized. No evidence of aortic valve sclerosis or stenosis. 11. The pulmonic valve was normal in structure. Pulmonic valve regurgitation is not visualized. 12. Mildly elevated pulmonary artery systolic pressure. 13. The tricuspid regurgitant velocity is 2.71 m/s, and with an assumed right atrial pressure of 8 mmHg, the estimated right ventricular systolic pressure is mildly elevated at 37.4 mmHg. 14. The inferior vena cava is dilated in size with >50% respiratory variability, suggesting right atrial pressure of 8 mmHg. FINDINGS  Left Ventricle: Left ventricular ejection fraction, by visual estimation, is 20 to 25%. The left ventricle has severely decreased function. There is moderately increased left ventricular hypertrophy. Concentric left ventricular hypertrophy. Left ventricular diastolic parameters are consistent with Grade I diastolic dysfunction (impaired relaxation). Normal left atrial pressure. Right Ventricle: The right ventricular size is normal. No increase in right ventricular wall thickness. Global RV systolic function is has normal systolic function. The tricuspid regurgitant velocity is 2.71 m/s, and with an assumed right atrial pressure  of 8 mmHg, the estimated right ventricular  systolic pressure is mildly elevated at 37.4 mmHg. Left Atrium: Left atrial size  was mildly dilated. Right Atrium: Right atrial size was normal in size Pericardium: A small pericardial effusion is present. The pericardial effusion is posterior to the left ventricle. Mitral Valve: The mitral valve is normal in structure. No evidence of mitral valve stenosis by observation. Mild mitral valve regurgitation. Tricuspid Valve: The tricuspid valve is normal in structure. Tricuspid valve regurgitation is mild. Aortic Valve: The aortic valve is normal in structure. Aortic valve regurgitation is not visualized. The aortic valve is structurally normal, with no evidence of sclerosis or stenosis. Pulmonic Valve: The pulmonic valve was normal in structure. Pulmonic valve regurgitation is not visualized. Aorta: The aortic root, ascending aorta and aortic arch are all structurally normal, with no evidence of dilitation or obstruction. Venous: The inferior vena cava is dilated in size with greater than 50% respiratory variability, suggesting right atrial pressure of 8 mmHg. IAS/Shunts: No atrial level shunt detected by color flow Doppler. There is no evidence of a patent foramen ovale. No ventricular septal defect is seen or detected. There is no evidence of an atrial septal defect.  LEFT VENTRICLE PLAX 2D LVIDd:         4.80 cm  Diastology LVIDs:         3.80 cm  LV e' lateral:   4.57 cm/s LV PW:         1.00 cm  LV E/e' lateral: 9.9 LV IVS:        1.00 cm  LV e' medial:    4.24 cm/s LVOT diam:     2.00 cm  LV E/e' medial:  10.6 LV SV:         46 ml LV SV Index:   33.42 LVOT Area:     3.14 cm  RIGHT VENTRICLE RV S prime:     13.40 cm/s TAPSE (M-mode): 1.7 cm LEFT ATRIUM             Index       RIGHT ATRIUM           Index LA diam:        4.00 cm 2.93 cm/m  RA Area:     10.40 cm LA Vol (A2C):   49.3 ml 36.14 ml/m RA Volume:   22.50 ml  16.49 ml/m LA Vol (A4C):   43.2 ml 31.67 ml/m LA Biplane Vol: 47.4 ml 34.75 ml/m  AORTIC VALVE LVOT Vmax:   79.10 cm/s LVOT Vmean:  50.400 cm/s LVOT VTI:    0.118 m  AORTA  Ao Root diam: 2.80 cm MV E velocity: 45.10 cm/s 103 cm/s  TRICUSPID VALVE MV A velocity: 82.00 cm/s 70.3 cm/s TR Peak grad:   29.4 mmHg MV E/A ratio:  0.55       1.5       TR Vmax:        271.00 cm/s                                      SHUNTS                                     Systemic VTI:  0.12 m  Systemic Diam: 2.00 cm  Ena Dawley MD Electronically signed by Ena Dawley MD Signature Date/Time: 12/25/2018/3:24:42 PM    Final     Microbiology: Recent Results (from the past 240 hour(s))  SARS CORONAVIRUS 2 (TAT 6-24 HRS) Nasopharyngeal Nasopharyngeal Swab     Status: None   Collection Time: 12/25/18 12:32 PM   Specimen: Nasopharyngeal Swab  Result Value Ref Range Status   SARS Coronavirus 2 NEGATIVE NEGATIVE Final    Comment: (NOTE) SARS-CoV-2 target nucleic acids are NOT DETECTED. The SARS-CoV-2 RNA is generally detectable in upper and lower respiratory specimens during the acute phase of infection. Negative results do not preclude SARS-CoV-2 infection, do not rule out co-infections with other pathogens, and should not be used as the sole basis for treatment or other patient management decisions. Negative results must be combined with clinical observations, patient history, and epidemiological information. The expected result is Negative. Fact Sheet for Patients: SugarRoll.be Fact Sheet for Healthcare Providers: https://www.woods-mathews.com/ This test is not yet approved or cleared by the [place] FDA and  has been authorized for detection and/or diagnosis of SARS-CoV-2 by FDA under an Emergency Use Authorization (EUA). This EUA will remain  in effect (meaning this test can be used) for the duration of the COVID-19 declaration under Section 56 4(b)(1) of the Act, 21 U.S.C. section 360bbb-3(b)(1), unless the authorization is terminated or revoked sooner. Performed at [facility], [place] [address], [place], [place] [address]      Labs: Basic Metabolic Panel: Recent Labs  Lab 12/25/18 2351 12/27/18 0304 12/28/18 0315 12/29/18 0214 12/30/18 0254  NA 134* 134* 136 132* 129*  K 3.9 3.3* 3.7 4.1 3.1*  CL 96* 95* 92* 91* 90*  CO2 24 28 32 31 29  GLUCOSE 143* 97 127* 122* 100*  BUN 29* 39* 35* 42* 35*  CREATININE 1.38* 1.18* 1.38* 1.72* 1.11*  CALCIUM 9.3 9.1 9.6 9.6 9.0   Liver Function Tests: Recent Labs  Lab 12/25/18 0922  AST 28  ALT 17  ALKPHOS 69  BILITOT 0.5  PROT 7.5  ALBUMIN 3.8   No results for input(s): LIPASE, AMYLASE in the last 168 hours. No results for input(s): AMMONIA in the last 168 hours. CBC: Recent Labs  Lab 12/25/18 0922 12/25/18 2351 12/29/18 0214  WBC 11.5* 11.0* 7.5  NEUTROABS 9.1* 8.5* 2.4  HGB 12.9 10.6* 12.4  HCT 39.2 31.3* 36.3  MCV 103.4* 100.6* 100.0  PLT 357 286 331   Cardiac Enzymes: Recent Labs  Lab 12/25/18 0922  CKTOTAL 64   BNP: BNP (last 3 results) Recent Labs    12/25/18 0922  BNP 2,159.0*    ProBNP (last 3 results) No results for input(s): PROBNP in the last 8760 hours.  CBG: No results for input(s): GLUCAP in the last 168 hours.  Principal Problem:   Acute on chronic respiratory failure with hypoxia (HCC) Active Problems:   Essential hypertension   Depression, recurrent (HCC)   Acute heart failure (HCC)   Acute on chronic respiratory failure (HCC)   Hypertensive heart disease with heart failure ([place])   AKI (acute kidney injury) ([place])   Time coordinating discharge: 38 minutes.  Signed:        Cacie Gaskins, DO [facility]  12/30/2018, 4:25 PM

## 2018-12-30 NOTE — Progress Notes (Signed)
Orders received to discharge patient.  Telemetry monitor removed and CCMD notified.  PIV access removed.  Discharge instructions, follow up, medications and instructions for their use discussed with patient. 

## 2018-12-30 NOTE — Progress Notes (Signed)
PROGRESS NOTE  Sharrell Lute [id number] DOB: 10-Jun-1950 DOA: 12/25/2018 PCP: Isaac Bliss, Rayford Halsted, MD  Brief History   [name] is a 68 y.o. female with medical history significant of SBO; H TN; and chronic pain syndrome presenting with SOB.  She was here with PNA recently.  She was sent home from the ER and felt better after about a week.  The week after Thanksgiving she started not feeling well again and was seen.  PNA improved, sent home on medications.  This past week, she wasn't feeling as well.  [name] she was ok but she got worse and acutely SOB last night.  Her husband called 911 earlier this AM.  Intermittent SOB for the last month.  No known COVID contacts and they have been very careful.  SOB is with exertion, increased fatigue.  +orthpnea. +PND.  L leg swelling > R - started after the GI doctor started her on medication.  She gained about 6 pounds in about 2 weeks.  Patient has ongoing grief regarding the loss of her sons and her daughter is concerned this is contributing.   Found down, SOB.  EMS started BIPAP and gave NTG.  Significant tachypnea with wet rales.  Appears to be CHF exacerbation, BNP 2100, CXR with scattered pulmonary edema.  Vastly improved with BIPAP.  BD COVID negative, Hologic is pending.  Normal rectal temp.  Elevated lactate likely related to being down and respiratory distress.   The patient was admitted to a telemetry bed. She is being diuresed. Cardiology has been consulted. Echocardiogram was performed and demonstrated EF of 20-25% with Grade 1 diastolic dysfunction. There is a small pericardial effusion. There is elevated pulmonary artery systolic pressure.   Although the patient's respiratory issues are resolving, she complains of chest heaviness with ambulation. Cardiology has planned for the patient to have a coronary CTA today.   Consultants  . Cardiology  Procedures  . None  Antibiotics   Anti-infectives (From admission, onward)    None     Subjective  The patient is resting comfortably. No new complaints.   Objective   Vitals:  Vitals:   12/30/18 0923 12/30/18 1236  BP: 115/86 113/70  Pulse: 75 71  Resp: 15 17  Temp: 97.9 F (36.6 C) 98 F (36.7 C)  SpO2: 100% 100%   Exam:  Constitutional:  . The patient is awake, alert, and oriented x 3. No acute distress. Eyes:  . PERRLA . EOMBI . Normal lids and conjunctivae Neck:  . neck appears normal, no masses, normal ROM, supple . no thyromegaly . 9 cm JVD Respiratory:  . No increased work of breathing. . No wheezes or rhonchi . Positive for scattered rales . No tactile fremitus Cardiovascular:  . Regular rate and rhythm . No murmurs, ectopy, or gallups. . No lateral PMI. No thrills. Abdomen:  . Abdomen is soft, non-tender, non-distended . No hernias, masses, or organomegaly . Normoactive bowel sounds.  Musculoskeletal:  . No cyanosis, clubbing, or edema Skin:  . No rashes, lesions, ulcers . palpation of skin: no induration or nodules Neurologic:  . CN 2-12 intact . Sensation all 4 extremities intact Psychiatric:  . Mental status  Mood, affect appropriate  Orientation to person, place, time  . judgment and insight appear intact  I have personally reviewed the following:   Today's Data  . Vitals, CBC, BMP  Imaging  . CXR  Cardiology Data  . Echocardiogram  Scheduled Meds: . aspirin EC  81 mg Oral Daily  .  atorvastatin  40 mg Oral q1800  . carvedilol  12.5 mg Oral BID WC  . enoxaparin (LOVENOX) injection  30 mg Subcutaneous Q24H  . escitalopram  15 mg Oral QHS  . influenza vaccine adjuvanted  0.5 mL Intramuscular Tomorrow-1000  . mirtazapine  15 mg Oral QHS  . pantoprazole  20 mg Oral Daily  . sacubitril-valsartan  1 tablet Oral BID  . sodium chloride flush  3 mL Intravenous Q12H   Continuous Infusions: . sodium chloride    . nitroGLYCERIN 3 mcg/min (12/27/18 0740)    Principal Problem:   Acute on chronic respiratory  failure with hypoxia (HCC) Active Problems:   Essential hypertension   Depression, recurrent (HCC)   Acute heart failure (HCC)   Acute on chronic respiratory failure (HCC)   Hypertensive heart disease with heart failure (HCC)   AKI (acute kidney injury) ([place])   LOS: 5 days   A & P  Acute on chronic respiratory failure with hypoxia associated with new-onset CHF: Pt has had a trial of antibiotics as outpatient. Will check procalcitonin. Cardiology has been consulted. The patient is receiving IV diuresis, Cozaar, ASA. Pt initially required BIPAP, but has been transitioned to nasal cannula, and now to room air with saturations in the high 90's. Monitor.  Acute exacerbation of CHF:  EF 20% with Grade 1 diastolic dysfunction on echocardiogram. There was a markedly elevated BNP on admission. Diuresis with removal of 1 liter fluid since admission. CXR consistent with pulmonary edema. Cardiology has been consulted. The patient is receiving IV diuresis, Cozaar, ASA. Cardiology is planning for ischemic evaluation once CHF is improved. Procalcitonin, but patient is improving without IV antibiotics. She is afebrile and has only a slight leukocytosis. CXR this am demonstrates that edema is largely resolved. She is 2 liters negative by nursing documentation of I's and O's.  Chest heaviness: Pt complains of chest heaviness with ambulation. Cardiology plans on a Coronary CTA today for risk stratification.  Hypertension: BP control with NTG drip as per cardiology. Coreg 12.5 bid has been started with as needed IV lopresser. Continue Cozaar. Norvasc and HCTZ held.   Hypokalemia: Supplemented. Monitor. Pt being monitored on telemetry.  Lactic acidosis: Elevated lactic likely due to hypoxia and CHF and not sepsis.   AKI on CKD IIIa: Baseline creatinine is 0.94 with GFR of greater than 60. Creatinine is increased to 1.38 with GFR now reduced to 46. Check renal ultrasound. May continue to increase with diuresis.  May be due to cardiorenal situation.   Hyperlipidemia: Start empiric Lipitor. LDL: 105, HDL 100   Depression:  Continue Lexapro and Remeron. Consider outpatient psych consult as long as she continues to endorse no SI; otherwise consider inpatient consult.  I have seen and examined this patient myself. I have spent 35 minutes in her evaluation and care.    DVT prophylaxis: Lovenox  Code Status:  Full - confirmed with patient/daughter Family Communication: No family at bedside. Disposition Plan:  Home once clinically improved  Brenly Trawick, DO [facility] Direct contact: see [email or web address]  7PM-7AM contact night coverage as above 12/29/2018, 4:04 PM  LOS: 1 day

## 2018-12-30 NOTE — Telephone Encounter (Signed)
New Message  Scheduled TOC visit via [facility]y [facility] request. Scheduled with Melina Copa PA on 01/04/2019 at 11:00 am.

## 2018-12-30 NOTE — Progress Notes (Signed)
Progress Note  Patient Name: [name] Date of Encounter: 12/30/2018  Primary Cardiologist: No primary care provider on file.   Subjective   Feeling much better.  She had chest pressure yesterday with ambulation.  No shortness of breath or orthopnea.  Inpatient Medications    Scheduled Meds: . aspirin EC  81 mg Oral Daily  . atorvastatin  40 mg Oral q1800  . carvedilol  12.5 mg Oral BID WC  . enoxaparin (LOVENOX) injection  30 mg Subcutaneous Q24H  . escitalopram  15 mg Oral QHS  . influenza vaccine adjuvanted  0.5 mL Intramuscular Tomorrow-1000  . mirtazapine  15 mg Oral QHS  . pantoprazole  20 mg Oral Daily  . sacubitril-valsartan  1 tablet Oral BID  . sodium chloride flush  3 mL Intravenous Q12H   Continuous Infusions: . sodium chloride    . nitroGLYCERIN 3 mcg/min (12/27/18 0740)   PRN Meds: sodium chloride, acetaminophen, diphenoxylate-atropine, hyoscyamine, ondansetron (ZOFRAN) IV, sodium chloride flush   Vital Signs    Vitals:   12/29/18 1141 12/29/18 1939 12/30/18 0016 12/30/18 0424  BP: 119/79 126/82 (!) 108/57 (!) 116/93  Pulse: 84 90 65 68  Resp: 20 (!) 22 13 (!) 22  Temp: (!) 97.5 F (36.4 C) 97.7 F (36.5 C) 97.9 F (36.6 C) 97.6 F (36.4 C)  TempSrc: Oral Oral Oral Oral  SpO2: 100% 100% 99% 100%  Weight:    49.3 kg  Height:        Intake/Output Summary (Last 24 hours) at 12/30/2018 0849 Last data filed at 12/29/2018 1940 Gross per 24 hour  Intake -  Output 300 ml  Net -300 ml   Last 3 Weights 12/30/2018 12/29/2018 12/28/2018  Weight (lbs) 108 lb 9.6 oz 106 lb 11.2 oz 102 lb 6.4 oz  Weight (kg) 49.261 kg 48.399 kg 46.448 kg      Telemetry    Sinus rhythm.  No events. - Personally Reviewed  ECG    Sinus rhythm.  Rate 80 bpm.  LVH with repolarization abnormalities.  QTc 525 ms.  - Personally Reviewed  Physical Exam   VS:  BP (!) 116/93 (BP Location: Left Arm)   Pulse 68   Temp 97.6 F (36.4 C) (Oral)   Resp (!) 22   Ht 4'  11" (1.499 m)   Wt 49.3 kg   SpO2 100%   BMI 21.93 kg/m  , BMI Body mass index is 21.93 kg/m. GENERAL:  Well appearing HEENT: Pupils equal round and reactive, fundi not visualized, oral mucosa unremarkable NECK:  no jugular venous distention, waveform within normal limits, carotid upstroke brisk and symmetric, no bruits LUNGS:  Clear to auscultation bilaterally HEART:  RRR.  PMI not displaced or sustained,S1 and S2 within normal limits, no S3, no S4, no clicks, no rubs, no murmurs ABD:  Flat, positive bowel sounds normal in frequency in pitch, no bruits, no rebound, no guarding, no midline pulsatile mass, no hepatomegaly, no splenomegaly EXT:  2 plus pulses throughout, no edema, no cyanosis no clubbing SKIN:  No rashes no nodules NEURO:  Cranial nerves II through XII grossly intact, motor grossly intact throughout PSYCH:  Cognitively intact, oriented to person place and time   Labs    High Sensitivity Troponin:   Recent Labs  Lab 12/25/18 0922 12/25/18 1133  TROPONINIHS 13 39*      Chemistry Recent Labs  Lab 12/25/18 0922 12/28/18 0315 12/29/18 0214 12/30/18 0254  NA 133* 136 132* 129*  K 3.1*  3.7 4.1 3.1*  CL 99 92* 91* 90*  CO2 19* 32 31 29  GLUCOSE 328* 127* 122* 100*  BUN 24* 35* 42* 35*  CREATININE 1.32* 1.38* 1.72* 1.11*  CALCIUM 9.5 9.6 9.6 9.0  PROT 7.5  --   --   --   ALBUMIN 3.8  --   --   --   AST 28  --   --   --   ALT 17  --   --   --   ALKPHOS 69  --   --   --   BILITOT 0.5  --   --   --   GFRNONAA 42* 39* 30* 51*  GFRAA 48* 46* 35* 60*  ANIONGAP 15 12 10 10      Hematology Recent Labs  Lab 12/25/18 0922 12/25/18 2351 12/29/18 0214  WBC 11.5* 11.0* 7.5  RBC 3.79* 3.11* 3.63*  HGB 12.9 10.6* 12.4  HCT 39.2 31.3* 36.3  MCV 103.4* 100.6* 100.0  MCH 34.0 34.1* 34.2*  MCHC 32.9 33.9 34.2  RDW 12.4 12.7 12.0  PLT 357 286 331    BNP Recent Labs  Lab 12/25/18 0922  BNP 2,159.0*     DDimer No results for input(s): DDIMER in the last  168 hours.   Radiology    No results found.  Cardiac Studies   Echo 12/25/18: 1. Left ventricular ejection fraction, by visual estimation, is 20 to 25%. The left ventricle has severely decreased function. There is moderately increased left ventricular hypertrophy.  2. Left ventricular diastolic parameters are consistent with Grade I diastolic dysfunction (impaired relaxation).  3. Global right ventricle has normal systolic function.The right ventricular size is normal. No increase in right ventricular wall thickness.  4. Left atrial size was mildly dilated.  5. Right atrial size was normal.  6. Small pericardial effusion.  7. The pericardial effusion is posterior to the left ventricle.  8. The mitral valve is normal in structure. Mild mitral valve regurgitation. No evidence of mitral stenosis.  9. The tricuspid valve is normal in structure. Tricuspid valve regurgitation is mild. 10. The aortic valve is normal in structure. Aortic valve regurgitation is not visualized. No evidence of aortic valve sclerosis or stenosis. 11. The pulmonic valve was normal in structure. Pulmonic valve regurgitation is not visualized. 12. Mildly elevated pulmonary artery systolic pressure. 13. The tricuspid regurgitant velocity is 2.71 m/s, and with an assumed right atrial pressure of 8 mmHg, the estimated right ventricular systolic pressure is mildly elevated at 37.4 mmHg. 14. The inferior vena cava is dilated in size with >50% respiratory variability, suggesting right atrial pressure of 8 mmHg.  Cardiac MRI 12/27/18: IMPRESSION: 1. Normal left ventricular size with moderate concentric hypertrophy and severely impaired systolic function (LVEF = 31%) with diffuse hypokinesis.  There are focal gadolinium enhancements at the attachment points of the right ventricle to the left ventricle consistent with fluid overload.  Native T1 1085 ms, Post contrast T1 255 ms, ECV 28%. These findings are consistent with  hypertensive heart disease with CHF.  2. Normal right ventricular size, thickness and systolic function (LVEF = 49%). There are no regional wall motion abnormalities.  3. Mildly dilated pulmonary artery measuring 32 mm.  4. Mild mitral and tricuspid regurgitation.  5. Mild circumferential pericardial effusion.    Patient Profile     68 y.o. female with hypertension and family history of premature CAD and HF admitted with acute systolic and diastolic heart failure.   Assessment & Plan    #  Acute systolic and diastolic heart failure: # Essential hypertension:  LVEF 20-25%.  Moderate LVH on echo.  Cardiac MRI did not show an infiltrative cardiomyopathy and was more consistent with hypertensive cardiomyopathy.  She is euvolemic.  She had AKI in the setting of overdiuresis.  Renal function has now normalized.  Continue carvedilol and Entresto.  Plan to add spironolactone at discharge.  Start lasix 40mg  po daily on 12/11.  Will get coronary CT-A today.  # Chest pain: Coronary CT-A today.  Continue aspirin.  # Acute hypoxic heart failure: Now off BiPAP and on RA.  Resolved.   # AKI:  Initially improved and worsened in the setting of diuresis.  Renal function is now back to baseline.  # Hyperlipidemia:  Atorvastatin was started this admission.     For questions or updates, please contact [place] Please consult [email or web address] for contact info under        Signed, Skeet Latch, MD  12/30/2018, 8:49 AM

## 2018-12-31 NOTE — Telephone Encounter (Signed)
TCM Call 1st Attempt: I called the number listed in the pts chart X2 but got no answer and could not leave a message as the pts VM has not been set up.

## 2019-01-03 ENCOUNTER — Encounter: Payer: Self-pay | Admitting: Physician Assistant
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
## 2019-01-04 ENCOUNTER — Ambulatory Visit: Payer: Medicare Other | Admitting: Physician Assistant

## 2019-01-04 ENCOUNTER — Telehealth: Payer: Self-pay

## 2019-01-04 DIAGNOSIS — I428 Other cardiomyopathies: Secondary | ICD-10-CM

## 2019-01-04 DIAGNOSIS — I5042 Chronic combined systolic (congestive) and diastolic (congestive) heart failure: Secondary | ICD-10-CM

## 2019-01-04 DIAGNOSIS — E785 Hyperlipidemia, unspecified: Secondary | ICD-10-CM

## 2019-01-04 DIAGNOSIS — I10 Essential (primary) hypertension: Secondary | ICD-10-CM
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
## 2019-01-04 NOTE — Telephone Encounter (Signed)
Attempted twice to contact patient to schedule hospital follow up with PCP Dr. Jerilee Hoh. No voicemail available to leave message. Letter sent to patient.

## 2019-01-06 ENCOUNTER — Telehealth: Payer: Self-pay | Admitting: Internal Medicine

## 2019-01-06 MED ORDER — ESCITALOPRAM OXALATE 10 MG PO TABS: ORAL_TABLET | ORAL | 0 refills | Status: DC

## 2019-01-06 NOTE — Telephone Encounter (Signed)
Patient needs a refill Lexapro 10 mg, 1 1/2 tablets at bedtime.  Patient is out of this medication.  Pharmacy- [facility]

## 2019-01-06 NOTE — Telephone Encounter (Signed)
Refill sent.  Will need an office visit for more refills.

## 2019-01-08 ENCOUNTER — Other Ambulatory Visit: Payer: Self-pay | Admitting: Internal Medicine

## 2019-01-24 ENCOUNTER — Other Ambulatory Visit: Payer: Self-pay

## 2019-01-24 ENCOUNTER — Telehealth: Payer: Self-pay | Admitting: *Deleted

## 2019-01-24 NOTE — Telephone Encounter (Signed)
Copied from [place] [id number]. Topic: General - Other >> Jan 20, 2019  2:06 PM Keene Breath wrote: Reason for CRM: Patient is returning a call to the office.  Please call patient back at [phone]

## 2019-01-25 ENCOUNTER — Encounter: Payer: Self-pay | Admitting: Internal Medicine

## 2019-01-25 ENCOUNTER — Ambulatory Visit (INDEPENDENT_AMBULATORY_CARE_PROVIDER_SITE_OTHER): Payer: Medicare Other | Admitting: Internal Medicine

## 2019-01-25 VITALS — BP 130/80 | HR 80 | Temp 97.8°F | Wt 126.8 lb

## 2019-01-25 DIAGNOSIS — Z09 Encounter for follow-up examination after completed treatment for conditions other than malignant neoplasm: Secondary | ICD-10-CM | POA: Diagnosis not present

## 2019-01-25 DIAGNOSIS — F339 Major depressive disorder, recurrent, unspecified: Secondary | ICD-10-CM | POA: Diagnosis not present

## 2019-01-25 DIAGNOSIS — K580 Irritable bowel syndrome with diarrhea: Secondary | ICD-10-CM

## 2019-01-25 DIAGNOSIS — Z23 Encounter for immunization: Secondary | ICD-10-CM | POA: Diagnosis not present

## 2019-01-25 DIAGNOSIS — I5042 Chronic combined systolic (congestive) and diastolic (congestive) heart failure: Secondary | ICD-10-CM | POA: Diagnosis not present

## 2019-01-25 DIAGNOSIS — I10 Essential (primary) hypertension: Secondary | ICD-10-CM

## 2019-01-25 DIAGNOSIS — R1084 Generalized abdominal pain: Secondary | ICD-10-CM | POA: Diagnosis not present

## 2019-01-25 MED ORDER — FUROSEMIDE 20 MG PO TABS: 20.0000 mg | ORAL_TABLET | Freq: Every day | ORAL | 1 refills | Status: DC

## 2019-01-25 MED ORDER — CARVEDILOL 12.5 MG PO TABS: 12.5000 mg | ORAL_TABLET | Freq: Two times a day (BID) | ORAL | 1 refills | Status: DC

## 2019-01-25 MED ORDER — ATORVASTATIN CALCIUM 40 MG PO TABS: 40.0000 mg | ORAL_TABLET | Freq: Every day | ORAL | 1 refills | Status: DC

## 2019-01-25 MED ORDER — PANTOPRAZOLE SODIUM 20 MG PO TBEC: 20.0000 mg | DELAYED_RELEASE_TABLET | Freq: Every day | ORAL | 1 refills | Status: DC

## 2019-01-25 MED ORDER — SACUBITRIL-VALSARTAN 24-26 MG PO TABS: 1.0000 | ORAL_TABLET | Freq: Two times a day (BID) | ORAL | 1 refills | Status: DC

## 2019-01-25 MED ORDER — HYOSCYAMINE SULFATE 0.125 MG SL SUBL: 0.1250 mg | SUBLINGUAL_TABLET | SUBLINGUAL | 1 refills | Status: DC | PRN

## 2019-01-25 MED ORDER — ESCITALOPRAM OXALATE 10 MG PO TABS: ORAL_TABLET | ORAL | 1 refills | Status: DC

## 2019-01-25 MED ORDER — DIPHENOXYLATE-ATROPINE 2.5-0.025 MG PO TABS: 1.0000 | ORAL_TABLET | Freq: Two times a day (BID) | ORAL | 2 refills | Status: AC | PRN

## 2019-01-25 MED ORDER — MIRTAZAPINE 15 MG PO TABS: 15.0000 mg | ORAL_TABLET | Freq: Every day | ORAL | 1 refills | Status: DC

## 2019-01-25 NOTE — Telephone Encounter (Signed)
Spoke with patient.

## 2019-01-25 NOTE — Patient Instructions (Signed)
-  Nice seeing you today!!  -Start lasix 20 mg daily.  -Schedule follow up in 3 months.  -Flu vaccine today.
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
## 2019-02-28 ENCOUNTER — Other Ambulatory Visit: Payer: Self-pay

## 2019-02-28 ENCOUNTER — Ambulatory Visit (INDEPENDENT_AMBULATORY_CARE_PROVIDER_SITE_OTHER): Payer: Medicare Other | Admitting: Cardiology

## 2019-02-28 ENCOUNTER — Other Ambulatory Visit: Payer: Self-pay | Admitting: Internal Medicine

## 2019-02-28 ENCOUNTER — Encounter: Payer: Self-pay | Admitting: Cardiology

## 2019-02-28 VITALS — BP 154/84 | HR 77 | Ht 60.0 in | Wt 133.0 lb

## 2019-02-28 DIAGNOSIS — E876 Hypokalemia: Secondary | ICD-10-CM | POA: Diagnosis not present

## 2019-02-28 DIAGNOSIS — I10 Essential (primary) hypertension: Secondary | ICD-10-CM

## 2019-02-28 DIAGNOSIS — I5043 Acute on chronic combined systolic (congestive) and diastolic (congestive) heart failure: Secondary | ICD-10-CM | POA: Diagnosis not present

## 2019-02-28 DIAGNOSIS — I110 Hypertensive heart disease with heart failure: Secondary | ICD-10-CM

## 2019-02-28 DIAGNOSIS — R1084 Generalized abdominal pain: Secondary | ICD-10-CM

## 2019-02-28 MED ORDER — ENTRESTO 49-51 MG PO TABS: 1.0000 | ORAL_TABLET | Freq: Two times a day (BID) | ORAL | 3 refills | Status: DC

## 2019-02-28 MED ORDER — SPIRONOLACTONE 25 MG PO TABS: 12.5000 mg | ORAL_TABLET | Freq: Every day | ORAL | 2 refills | Status: DC

## 2019-02-28 MED ORDER — ALBUTEROL SULFATE HFA 108 (90 BASE) MCG/ACT IN AERS: 2.0000 | INHALATION_SPRAY | Freq: Four times a day (QID) | RESPIRATORY_TRACT | 2 refills | Status: DC | PRN

## 2019-02-28 NOTE — Patient Instructions (Signed)
Medication Instructions:   DR. Meda Coffee HAS PRESCRIBED YOU AN ALBUTEROL INHALER TO USE AS NEEDED FOR SHORTNESS OF BREATH, COUGH, OR WHEEZING.  YOU MAY DO 2 PUFFS AS NEEDED EVERY 6 HOURS.  STOP TAKING LASIX (FUROSEMIDE) NOW  START TAKING SPIRONOLACTONE 12.5 MG BY MOUTH DAILY  INCREASE YOUR ENTRESTO TO 49/51 MG BY MOUTH TWICE DAILY  *If you need a refill on your cardiac medications before your next appointment, please call your pharmacy*   Lab Work:  IN 2 WEEKS--WILL CHECK CMET AND PRO-BNP  If you have labs (blood work) drawn today and your tests are completely normal, you will receive your results only by: Marland Kitchen MyChart Message (if you have MyChart) OR . A paper copy in the mail If you have any lab test that is abnormal or we need to change your treatment, we will call you to review the results.   Testing/Procedures:  Your physician has requested that you have an echocardiogram. Echocardiography is a painless test that uses sound waves to create images of your heart. It provides your doctor with information about the size and shape of your heart and how well your heart's chambers and valves are working. This procedure takes approximately one hour. There are no restrictions for this procedure.  DR. Marcelle Overlie LIKE FOR YOUR ECHO TO BE SCHEDULED IN MARCH 2021  Follow-Up: At [facility], you and your health needs are our priority.  As part of our continuing mission to provide you with exceptional heart care, we have created designated Provider Care Teams.  These Care Teams include your primary Cardiologist (physician) and Advanced Practice Providers (APPs -  Physician Assistants and Nurse Practitioners) who all work together to provide you with the care you need, when you need it.  Your next appointment:   IN April 2021 [place] IN THE OFFICE   The format for your next appointment:   In Person  Provider:   Ena Dawley, MD

## 2019-03-02 ENCOUNTER — Other Ambulatory Visit: Payer: Self-pay | Admitting: Internal Medicine

## 2019-03-02 DIAGNOSIS — R1084 Generalized abdominal pain: Secondary | ICD-10-CM

## 2019-03-13 ENCOUNTER — Ambulatory Visit: Payer: Medicare Other | Attending: Internal Medicine

## 2019-03-13 DIAGNOSIS — Z23 Encounter for immunization: Secondary | ICD-10-CM

## 2019-03-13 NOTE — Progress Notes (Signed)
   Covid-19 Vaccination Clinic  Name:  Travonna Kosowski    MRN: [id number] DOB: [birth year]  03/13/2019  Ms. Stclair was observed post Covid-19 immunization for 15 minutes without incidence. She was provided with Vaccine Information Sheet and instruction to access the V-Safe system.   Ms. Felipe was instructed to call 911 with any severe reactions post vaccine: Marland Kitchen Difficulty breathing  . Swelling of your face and throat  . A fast heartbeat  . A bad rash all over your body  . Dizziness and weakness    Immunizations Administered    Name Date Dose VIS Date Route   Pfizer COVID-19 Vaccine 03/13/2019  1:44 PM 0.3 mL 12/31/2018 Intramuscular   Manufacturer: [place]   Lot: [id number]   [place]: [id number]

## 2019-03-16 ENCOUNTER — Other Ambulatory Visit: Payer: Medicare Other

## 2019-03-23 ENCOUNTER — Telehealth: Payer: Self-pay | Admitting: Cardiology

## 2019-03-23 NOTE — Telephone Encounter (Signed)
This is not correct, that copay would be over 4x the baseline wholesale price of the drug. Called pt's pharmacy - confirmed her spironolactone copay is $0 and that she picked up rx last month.

## 2019-03-23 NOTE — Telephone Encounter (Signed)
[name] or [name], would you know why the cost of spironolactone is so costly for this pt?  Any advice on where she can get this at an affordable cost, or does there need to be a prior auth on this med done by [place]? Please advise and thank you so much!

## 2019-03-23 NOTE — Telephone Encounter (Signed)
Attempted to call the pt back to endorse $0 cost of medication spironolactone, confirmed by our Pharmacist, but the pt did not answer, and her VM cannot accept messages at this time.

## 2019-03-23 NOTE — Telephone Encounter (Signed)
Pt c/o medication issue:  1. Name of Medication: spironolactone (ALDACTONE) 25 MG tablet  2. How are you currently taking this medication (dosage and times per day)? Not taking yet   3. Are you having a reaction (difficulty breathing--STAT)? no  4. What is your medication issue? The patient reported that she is not taking this medication. She went to pick up the first rx and her pharmacy told her it would be $300/ mo and she could not afford that. She has been working with [facility] to get some financial assistance, but has not had any success yet.  She just wanted to make Dr. Meda Coffee aware of her situation

## 2019-03-25 NOTE — Telephone Encounter (Signed)
Attempted to call the pt back to follow-up on call she placed on 03/23/19.  Pt did not answer and VM is not set-up. This is 2nd attempt at trying to make contact with the pt.

## 2019-03-29 ENCOUNTER — Other Ambulatory Visit: Payer: Self-pay

## 2019-03-29 ENCOUNTER — Ambulatory Visit (HOSPITAL_COMMUNITY): Payer: Medicare Other | Attending: Cardiology

## 2019-03-29 ENCOUNTER — Other Ambulatory Visit: Payer: Medicare Other

## 2019-03-29 DIAGNOSIS — I428 Other cardiomyopathies: Secondary | ICD-10-CM | POA: Insufficient documentation

## 2019-03-29 DIAGNOSIS — I10 Essential (primary) hypertension: Secondary | ICD-10-CM

## 2019-03-29 DIAGNOSIS — I081 Rheumatic disorders of both mitral and tricuspid valves: Secondary | ICD-10-CM | POA: Diagnosis not present

## 2019-03-29 DIAGNOSIS — I110 Hypertensive heart disease with heart failure: Secondary | ICD-10-CM

## 2019-03-29 DIAGNOSIS — I5043 Acute on chronic combined systolic (congestive) and diastolic (congestive) heart failure: Secondary | ICD-10-CM

## 2019-03-29 DIAGNOSIS — E785 Hyperlipidemia, unspecified: Secondary | ICD-10-CM | POA: Diagnosis not present

## 2019-03-30 ENCOUNTER — Encounter: Payer: Self-pay | Admitting: *Deleted

## 2019-03-30 LAB — COMPREHENSIVE METABOLIC PANEL
ALT: 22 IU/L (ref 0–32)
AST: 34 IU/L (ref 0–40)
Albumin/Globulin Ratio: 1.6 (ref 1.2–2.2)
Albumin: 4.6 g/dL (ref 3.8–4.8)
Alkaline Phosphatase: 78 IU/L (ref 39–117)
BUN/Creatinine Ratio: 18 (ref 12–28)
BUN: 22 mg/dL (ref 8–27)
Bilirubin Total: 0.4 mg/dL (ref 0.0–1.2)
CO2: 20 mmol/L (ref 20–29)
Calcium: 9.6 mg/dL (ref 8.7–10.3)
Chloride: 100 mmol/L (ref 96–106)
Creatinine, Ser: 1.25 mg/dL — ABNORMAL HIGH (ref 0.57–1.00)
GFR calc Af Amer: 51 mL/min/{1.73_m2} — ABNORMAL LOW (ref >59)
GFR calc non Af Amer: 45 mL/min/{1.73_m2} — ABNORMAL LOW (ref >59)
Globulin, Total: 2.8 g/dL (ref 1.5–4.5)
Glucose: 75 mg/dL (ref 65–99)
Potassium: 4.4 mmol/L (ref 3.5–5.2)
Sodium: 136 mmol/L (ref 134–144)
Total Protein: 7.4 g/dL (ref 6.0–8.5)

## 2019-03-30 LAB — PRO B NATRIURETIC PEPTIDE: NT-Pro BNP: 634 pg/mL — ABNORMAL HIGH (ref 0–301)

## 2019-03-30 NOTE — Telephone Encounter (Signed)
The office called this pt to endorse her recent echo and lab results, and to follow-up on this call placed over a week ago. With multiple attempts made, we are still unsuccessful in making contact with the pt.  Triage CMA sent a result letter to follow-up with the office, at mailing address noted on file.  Letter was sent out today.  Will close this encounter and refer to it as needed.

## 2019-04-06 ENCOUNTER — Ambulatory Visit: Payer: Medicare Other | Attending: Internal Medicine

## 2019-04-06 DIAGNOSIS — Z23 Encounter for immunization: Secondary | ICD-10-CM

## 2019-04-06 NOTE — Progress Notes (Signed)
   Covid-19 Vaccination Clinic  Name:  [name]    MRN: [id number] DOB: 1950-05-02  04/06/2019  [name] was observed post Covid-19 immunization for 15 minutes without incident. She was provided with Vaccine Information Sheet and instruction to access the V-Safe system.   [name] was instructed to call 911 with any severe reactions post vaccine: Marland Kitchen Difficulty breathing  . Swelling of face and throat  . A fast heartbeat  . A bad rash all over body  . Dizziness and weakness   Immunizations Administered    Name Date Dose VIS Date Route   Pfizer COVID-19 Vaccine 04/06/2019  9:16 AM 0.3 mL 12/31/2018 Intramuscular   Manufacturer: [place]   Lot: [id number]   [place]: [id number]

## 2019-05-06 ENCOUNTER — Other Ambulatory Visit: Payer: Self-pay

## 2019-05-06 ENCOUNTER — Ambulatory Visit (INDEPENDENT_AMBULATORY_CARE_PROVIDER_SITE_OTHER): Payer: Medicare Other | Admitting: Cardiology

## 2019-05-06 ENCOUNTER — Encounter: Payer: Self-pay | Admitting: Cardiology

## 2019-05-06 VITALS — BP 196/106 | HR 77 | Ht 60.0 in | Wt 134.0 lb

## 2019-05-06 DIAGNOSIS — I428 Other cardiomyopathies: Secondary | ICD-10-CM

## 2019-05-06 DIAGNOSIS — I10 Essential (primary) hypertension: Secondary | ICD-10-CM | POA: Diagnosis not present

## 2019-05-06 DIAGNOSIS — I110 Hypertensive heart disease with heart failure: Secondary | ICD-10-CM

## 2019-05-06 DIAGNOSIS — I5043 Acute on chronic combined systolic (congestive) and diastolic (congestive) heart failure: Secondary | ICD-10-CM

## 2019-05-06 MED ORDER — CARVEDILOL 25 MG PO TABS: 25.0000 mg | ORAL_TABLET | Freq: Two times a day (BID) | ORAL | 3 refills | Status: DC

## 2019-05-06 MED ORDER — SPIRONOLACTONE 25 MG PO TABS: 25.0000 mg | ORAL_TABLET | Freq: Every day | ORAL | 3 refills | Status: DC
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
## 2019-05-06 NOTE — Patient Instructions (Signed)
Medication Instructions:  INCREASE ALDACTONE TO 25 MG EVERY DAY  CARVEDILOL TO 25 MG TWICE DAILY  *If you need a refill on your cardiac medications before your next appointment, please call your pharmacy*     Follow-Up: At [facility], you and your health needs are our priority.  As part of our continuing mission to provide you with exceptional heart care, we have created designated Provider Care Teams.  These Care Teams include your primary Cardiologist (physician) and Advanced Practice Providers (APPs -  Physician Assistants and Nurse Practitioners) who all work together to provide you with the care you need, when you need it.  We recommend signing up for the patient portal called "MyChart".  Sign up information is provided on this After Visit Summary.  MyChart is used to connect with patients for Virtual Visits (Telemedicine).  Patients are able to view lab/test results, encounter notes, upcoming appointments, etc.  Non-urgent messages can be sent to your provider as well.   To learn more about what you can do with MyChart, go to [email or web address].    Your next appointment:   3 week(s)  The format for your next appointment:   In Person  Provider:   Ena Dawley, MD   Other Instructions

## 2019-05-25 ENCOUNTER — Ambulatory Visit: Payer: Medicare Other | Admitting: Cardiology
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
## 2019-06-08 ENCOUNTER — Encounter: Payer: Self-pay | Admitting: Physician Assistant

## 2019-06-08 ENCOUNTER — Ambulatory Visit (INDEPENDENT_AMBULATORY_CARE_PROVIDER_SITE_OTHER): Payer: Medicare Other | Admitting: Physician Assistant

## 2019-06-08 ENCOUNTER — Other Ambulatory Visit: Payer: Self-pay

## 2019-06-08 ENCOUNTER — Telehealth: Payer: Self-pay | Admitting: *Deleted

## 2019-06-08 VITALS — BP 150/88 | HR 84 | Ht 60.0 in | Wt 137.8 lb

## 2019-06-08 DIAGNOSIS — I5042 Chronic combined systolic (congestive) and diastolic (congestive) heart failure: Secondary | ICD-10-CM

## 2019-06-08 DIAGNOSIS — N183 Chronic kidney disease, stage 3 unspecified: Secondary | ICD-10-CM | POA: Diagnosis not present

## 2019-06-08 DIAGNOSIS — I10 Essential (primary) hypertension: Secondary | ICD-10-CM

## 2019-06-08 DIAGNOSIS — R0683 Snoring: Secondary | ICD-10-CM

## 2019-06-08 DIAGNOSIS — I428 Other cardiomyopathies: Secondary | ICD-10-CM

## 2019-06-08 NOTE — Telephone Encounter (Signed)
  Patient Consent for Virtual Visit         [name] has provided verbal consent on 06/08/2019 for a virtual visit (video or telephone).   CONSENT FOR VIRTUAL VISIT FOR:  [name]  By participating in this virtual visit I agree to the following:  I hereby voluntarily request, consent and authorize [facility] and its employed or contracted physicians, Engineer, materials, nurse practitioners or other licensed health care professionals (the Practitioner), to provide me with telemedicine health care services (the "Services") as deemed necessary by the treating Practitioner. I acknowledge and consent to receive the Services by the Practitioner via telemedicine. I understand that the telemedicine visit will involve communicating with the Practitioner through live audiovisual communication technology and the disclosure of certain medical information by electronic transmission. I acknowledge that I have been given the opportunity to request an in-person assessment or other available alternative prior to the telemedicine visit and am voluntarily participating in the telemedicine visit.  I understand that I have the right to withhold or withdraw my consent to the use of telemedicine in the course of my care at any time, without affecting my right to future care or treatment, and that the Practitioner or I may terminate the telemedicine visit at any time. I understand that I have the right to inspect all information obtained and/or recorded in the course of the telemedicine visit and may receive copies of available information for a reasonable fee.  I understand that some of the potential risks of receiving the Services via telemedicine include:  Marland Kitchen Delay or interruption in medical evaluation due to technological equipment failure or disruption; . Information transmitted may not be sufficient (e.g. poor resolution of images) to allow for appropriate medical decision making by the Practitioner;  and/or  . In rare instances, security protocols could fail, causing a breach of personal health information.  Furthermore, I acknowledge that it is my responsibility to provide information about my medical history, conditions and care that is complete and accurate to the best of my ability. I acknowledge that Practitioner's advice, recommendations, and/or decision may be based on factors not within their control, such as incomplete or inaccurate data provided by me or distortions of diagnostic images or specimens that may result from electronic transmissions. I understand that the practice of medicine is not an exact science and that Practitioner makes no warranties or guarantees regarding treatment outcomes. I acknowledge that a copy of this consent can be made available to me via my patient portal ([facility]), or I can request a printed copy by calling the office of [facility].    I understand that my insurance will be billed for this visit.   I have read or had this consent read to me. . I understand the contents of this consent, which adequately explains the benefits and risks of the Services being provided via telemedicine.  . I have been provided ample opportunity to ask questions regarding this consent and the Services and have had my questions answered to my satisfaction. . I give my informed consent for the services to be provided through the use of telemedicine in my medical care   3

## 2019-06-08 NOTE — Patient Instructions (Addendum)
Medication Instructions:  Your physician recommends that you continue on your current medications as directed. Please refer to the Current Medication list given to you today.  *If you need a refill on your cardiac medications before your next appointment, please call your pharmacy*   Lab Work: TODAY:  BMET, TSH, FREE T4, TOTAL T3, UA, CATECHOLAMINES UA, & METANEPHRINES UA  If you have labs (blood work) drawn today and your tests are completely normal, you will receive your results only by: Marland Kitchen MyChart Message (if you have MyChart) OR . A paper copy in the mail If you have any lab test that is abnormal or we need to change your treatment, we will call you to review the results.   Testing/Procedures: Your physician has requested that you have a renal artery duplex. During this test, an ultrasound is used to evaluate blood flow to the kidneys. Allow one hour for this exam. Do not eat after midnight the day before and avoid carbonated beverages. Take your medications as you usually do.  Your physician has recommended that you have a sleep study. This test records several body functions during sleep, including: brain activity, eye movement, oxygen and carbon dioxide blood levels, heart rate and rhythm, breathing rate and rhythm, the flow of air through your mouth and nose, snoring, body muscle movements, and chest and belly movement. (SOMEONE WILL CALL YOU FOR AN APPOINTMENT)   You have been referred to [facility], Dr. Skeet Latch.  They will call you for an appointment.    Follow-Up: At [facility], you and your health needs are our priority.  As part of our continuing mission to provide you with exceptional heart care, we have created designated Provider Care Teams.  These Care Teams include your primary Cardiologist (physician) and Advanced Practice Providers (APPs -  Physician Assistants and Nurse Practitioners) who all work together to provide you with the care you need, when  you need it.  We recommend signing up for the patient portal called "MyChart".  Sign up information is provided on this After Visit Summary.  MyChart is used to connect with patients for Virtual Visits (Telemedicine).  Patients are able to view lab/test results, encounter notes, upcoming appointments, etc.  Non-urgent messages can be sent to your provider as well.   To learn more about what you can do with MyChart, go to [email or web address].    Your next appointment:   1 WEEK   06/15/19 9:15   The format for your next appointment:   VIRTUAL  DO NOT COME IN THE OFFICE  Provider:   You may see Ena Dawley, MD or one of the following Advanced Practice Providers on your designated Care Team:    Melina Copa, PA-C  Ermalinda Barrios, PA-C    Other Instructions   Your [place] team (Cardiologist [facility] Doctor] and Advanced Practice Provider [phone] Assistant; Nurse Practitioner]) has arranged for your next office appointment to be a virtual visit (also known as "Telehealth", "Telemedicine", "E-Visit").   We now offer virtual visits for all our patients.  This helps [place] to expand our ability to see patients in a timely and safe manner.  These visits are billed to your insurance just like traditional, in person, appointments.  Please review this IMPORTANT information about your upcoming appointment.   **PLEASE READ THE SECTION BELOW LABELED "CONSENT".**   **CALL OUR OFFICE WITH QUESTIONS.**   WHAT YOU NEED FOR YOUR VIRTUAL VISIT:  You will need a SmartPhone with microphone and video capability.  [  If you are using MyChart to connect to your visit, it is also possible to use a desktop/laptop computer (with an Internet connection), as long as you have microphone and video capability.]  You will need to use [employer] or [employer] as your Wellsite geologist.  We highly recommend that you have a MyChart account as this will make connecting to your visit seamless.  A MyChart account  not only allows you to connect to your provider for a virtual visit, but also allows you to see the results of all your tests, provider notes, medications and upcoming appointments.    A MyChart account is not absolutely necessary.  We can still complete your visit if you do not have one.    If you do not have a computer or SmartPhone with video/microphone capability or your Internet/cell service is weak on the day of your visit, we will do your visit by telephone.    A blood pressure cuff and scale are essential to collect your vital signs at home.  If you do not have these and you are unable to obtain them, please contact our office so that we can make arrangements for you.  If you have a pulse oximeter, Apple watch, Kardia mobile device, etc, you can collect data from these devices as well to share with the provider for your visit.  These devices are not required for a virtual visit.    WHAT TO DO ON THE DAY OF YOUR APPOINTMENT: 30 minutes before your appointment:  Take your blood pressure, pulse or heart rate (if your blood pressure machine is able to collect it) and weight.  Write all these numbers down so you can give it to the nurse or medical assistant that calls.  Get all of the medications you currently take and put them where you will be sitting for the appointment.  The nurse/medical assistant will go over these with you when he/she calls.  15 minutes before your appointment:  You will receive a phone call from a nurse or medical assistant from our office.  The caller ID on your phone may indicate that the caller is either "[facility]" or "[place]".  However, the number may come across as spam.  Please turn off any spam blocker so you do not miss the call.  The nurse will:  Ask for your blood pressure, pulse, weight, height.  Go over all of your medications to make sure your chart is correct.  Review your allergies, smoking history, reason for appointment, etc.    Give you instructions on how to connect with the video platform or telephone.  IF YOUR VISIT IS BY TELEPHONE ONLY: After the nurse finishes getting you ready for the visit, your provider will call you on the phone number you provide to [place].   TO CONNECT WITH YOUR PROVIDER FOR YOUR APPOINTMENT (BY VIDEO): You will either connect with the provider with your MyChart account or (if you are not using MyChart) with a link sent to your SmartPhone by text message.  If you are using MyChart, see below.  If you are not using MyChart, the nurse will send you the text message during the phone call.     If you are connecting with your MyChart account:   (The nurse that calls you will tell you when to do this):  You will log into your account. At the top of your home screen, you should see the following prompt that tells you to "Begin your  video visit with . . . ".  Click the green button (BEGIN VISIT).    The next screen will say "It's time to start your video visit!" Click the green button (BEGIN VIDEO VISIT)    There may be a screen that appears that asks for permission to use your camera and/or microphone.  Click ALLOW.  This will open the browser where your appointment will take place.   You may see the message: "Welcome.  Waiting for the call to begin."  Or, you will see that the nurse or your provider are already "in" the room waiting on you.   If you are connecting with a link sent to your SmartPhone via text: The nurse that calls you will send the link by text message. Click on the link (it should look something like this):     If asked to give permission to use the camera and or microphone, click ALLOW This will open the browser where your appointment will take place.      You may see the message: "Welcome.  Waiting for the call to begin."  Or, you will see that the nurse or your provider are already "in" the room waiting on you.    The controls for your visit look like the  picture below.  Please note that this is what the microphone and camera look like when they are ON.  If muted, they will have a line through them.    After the appointment: Once your provider leaves the appointment, he/she will go over instructions with the nurse/medical assistant.  If needed, this person will call you with any instructions, appointments, etc.   A copy of your After Visit Summary (AVS) will be available later that day in your MyChart account.  This document will have all of your instructions, medications, appointments, etc.  If you are not using MyChart, we will mail it to your home.     *CONSENT FOR TELE-HEALTH VISIT - PLEASE REVIEW* By participating in the scheduled virtual visit (and any virtual visit scheduled within 365 days of the printing of this document), I agree to the following:   I hereby voluntarily request, consent and authorize [facility] and its employed or contracted physicians, physician assistants, nurse practitioners or other licensed health care professionals (the Practitioner), to provide me with telemedicine health care services (the "Services") as deemed necessary by the treating Practitioner. I acknowledge and consent to receive the Services by the Practitioner via telemedicine. I understand that the telemedicine visit will involve communicating with the Practitioner through live audiovisual communication technology and the disclosure of certain medical information by electronic transmission. I acknowledge that I have been given the opportunity to request an in-person assessment or other available alternative prior to the telemedicine visit and am voluntarily participating in the telemedicine visit.  I understand that I have the right to withhold or withdraw my consent to the use of telemedicine in the course of my care at any time, without affecting my right to future care or treatment, and that the Practitioner or I may terminate the telemedicine visit at  any time. I understand that I have the right to inspect all information obtained and/or recorded in the course of the telemedicine visit and may receive copies of available information for a reasonable fee.  I understand that some of the potential risks of receiving the Services via telemedicine include:  Marland Kitchen Delay or interruption in medical evaluation due to technological equipment failure or disruption; . Information  transmitted may not be sufficient (e.g. poor resolution of images) to allow for appropriate medical decision making by the Practitioner; and/or  . In rare instances, security protocols could fail, causing a breach of personal health information.  Furthermore, I acknowledge that it is my responsibility to provide information about my medical history, conditions and care that is complete and accurate to the best of my ability. I acknowledge that Practitioner's advice, recommendations, and/or decision may be based on factors not within their control, such as incomplete or inaccurate data provided by me or distortions of diagnostic images or specimens that may result from electronic transmissions. I understand that the practice of medicine is not an exact science and that Practitioner makes no warranties or guarantees regarding treatment outcomes. I acknowledge that a copy of this consent can be made available to me via my patient portal ([facility]), or I can request a printed copy by calling the office of [place].    I understand that my insurance will be billed for this visit.   I have read or had this consent read to me. . I understand the contents of this consent, which adequately explains the benefits and risks of the Services being provided via telemedicine.  . I have been provided ample opportunity to ask questions regarding this consent and the Services and have had my questions answered to my satisfaction. . I give my informed consent for the services to be provided  through the use of telemedicine in my medical care

## 2019-06-09 LAB — URINALYSIS
Bilirubin, UA: NEGATIVE
Glucose, UA: NEGATIVE
Leukocytes,UA: NEGATIVE
Nitrite, UA: NEGATIVE
RBC, UA: NEGATIVE
Specific Gravity, UA: 1.021 (ref 1.005–1.030)
Urobilinogen, Ur: 0.2 mg/dL (ref 0.2–1.0)
pH, UA: 5 (ref 5.0–7.5)

## 2019-06-09 LAB — BASIC METABOLIC PANEL
BUN/Creatinine Ratio: 18 (ref 12–28)
BUN: 22 mg/dL (ref 8–27)
CO2: 22 mmol/L (ref 20–29)
Calcium: 9.2 mg/dL (ref 8.7–10.3)
Chloride: 107 mmol/L — ABNORMAL HIGH (ref 96–106)
Creatinine, Ser: 1.2 mg/dL — ABNORMAL HIGH (ref 0.57–1.00)
GFR calc Af Amer: 54 mL/min/{1.73_m2} — ABNORMAL LOW (ref >59)
GFR calc non Af Amer: 47 mL/min/{1.73_m2} — ABNORMAL LOW (ref >59)
Glucose: 83 mg/dL (ref 65–99)
Potassium: 4.5 mmol/L (ref 3.5–5.2)
Sodium: 142 mmol/L (ref 134–144)

## 2019-06-09 LAB — TSH: TSH: 4.44 u[IU]/mL (ref 0.450–4.500)

## 2019-06-09 LAB — T3: T3, Total: 70 ng/dL — ABNORMAL LOW (ref 71–180)

## 2019-06-10 ENCOUNTER — Telehealth: Payer: Self-pay | Admitting: Physician Assistant

## 2019-06-10 DIAGNOSIS — I5043 Acute on chronic combined systolic (congestive) and diastolic (congestive) heart failure: Secondary | ICD-10-CM

## 2019-06-10 DIAGNOSIS — I110 Hypertensive heart disease with heart failure: Secondary | ICD-10-CM

## 2019-06-10 DIAGNOSIS — I10 Essential (primary) hypertension: Secondary | ICD-10-CM

## 2019-06-10 NOTE — Telephone Encounter (Signed)
Attempted to contact patient but there was no answer and VM not setup.

## 2019-06-10 NOTE — Telephone Encounter (Signed)
[name] is calling wanting to know when she should take her urine sample and bring it in for her virtual appointment with Estella Husk that is scheduled for 06/15/19. Please advise.

## 2019-06-13 ENCOUNTER — Other Ambulatory Visit: Payer: Self-pay

## 2019-06-13 ENCOUNTER — Ambulatory Visit (HOSPITAL_COMMUNITY)
Admission: RE | Admit: 2019-06-13 | Discharge: 2019-06-13 | Disposition: A | Discharge status: Home / Self Care | Payer: Medicare Other | Source: Ambulatory Visit | Attending: Cardiology | Admitting: Cardiology

## 2019-06-13 DIAGNOSIS — I10 Essential (primary) hypertension: Secondary | ICD-10-CM | POA: Diagnosis not present

## 2019-06-13 NOTE — Progress Notes (Signed)
No answer

## 2019-06-13 NOTE — Telephone Encounter (Signed)
Attempted to contact patient again but there was no answer and VM is not set up.

## 2019-06-14 ENCOUNTER — Telehealth: Payer: Self-pay

## 2019-06-14 NOTE — Telephone Encounter (Signed)
No VM set up.. 5/25

## 2019-06-14 NOTE — Telephone Encounter (Signed)
-----   Message from Charlie Pitter, [place] sent at 06/09/2019  8:04 AM EDT ----- Please let pt know labs are generally stable. Kidney function remains mildly abnormal but similar to prior. Would suggest she increase Entresto to 97/103mg  BID and change Lasix to every other day for now. Please have her relay 3 days worth of BP readings after making medication adjustments. F/u BMET in 1 week. I see that we were able to get her into the HTN clinic on June 1st, so can cancel the 5/26 virtual OV if pt is willing to communicate BP to [place] in the meantime as above. Thanks, Melina Copa PA-C

## 2019-06-14 NOTE — Telephone Encounter (Signed)
I called the patient's spouse in an effort to reach patient, but his mailbox is full.. 5/25

## 2019-06-15 ENCOUNTER — Encounter: Payer: Medicare Other | Admitting: Physician Assistant

## 2019-06-15 ENCOUNTER — Other Ambulatory Visit: Payer: Self-pay | Admitting: Internal Medicine

## 2019-06-15 ENCOUNTER — Other Ambulatory Visit: Payer: Self-pay

## 2019-06-15 ENCOUNTER — Telehealth: Payer: Self-pay | Admitting: *Deleted

## 2019-06-15 DIAGNOSIS — I5042 Chronic combined systolic (congestive) and diastolic (congestive) heart failure: Secondary | ICD-10-CM

## 2019-06-15 MED ORDER — ENTRESTO 97-103 MG PO TABS: 1.0000 | ORAL_TABLET | Freq: Two times a day (BID) | ORAL | 11 refills | Status: DC

## 2019-06-15 MED ORDER — FUROSEMIDE 20 MG PO TABS: 20.0000 mg | ORAL_TABLET | ORAL | 0 refills | Status: DC

## 2019-06-15 NOTE — Telephone Encounter (Signed)
I was finally able to reach patient by calling private from my cell phone. Apparently she has a block on the office number with calling from the office and on jabber.   Made her aware that we did not need this appointment today with Ermalinda Barrios, PA since she is seeing Dr. Oval Linsey for HTN on 6/1. Reviewed her lab results and recommendations from [facility], [place] below instructing patient to increase Entresto to 97-103 mg BID and change lasix to QOD. Patient will monitor BP and call with readings in 3 days as well as take her readings and BP cuff to her appointment with Dr. Oval Linsey on 6/1. She will get a BMET at her appt with Dr. Oval Linsey. Rxs updated at her preferred pharmacy. Patient verbalized understanding to all instruction.     Charlie Pitter, PA-C  06/09/2019 8:04 AM EDT    Please let pt know labs are generally stable. Kidney function remains mildly abnormal but similar to prior. Would suggest she increase Entresto to 97/103mg  BID and change Lasix to every other day for now. Please have her relay 3 days worth of BP readings after making medication adjustments. F/u BMET in 1 week. I see that we were able to get her into the HTN clinic on June 1st, so can cancel the 5/26 virtual OV if pt is willing to communicate BP to [place] in the meantime as above. Thanks, Melina Copa PA-C

## 2019-06-15 NOTE — Telephone Encounter (Signed)
Staff message sent to Nina ok to schedule sleep study. No PA is required. Patient has Medicare. 

## 2019-06-15 NOTE — Telephone Encounter (Signed)
-----   Message from Jeanann Lewandowsky, [place] sent at 06/08/2019  3:59 PM EDT ----- Regarding: SLEEP STUDY PT NEEDS A SLEEP STUDY. THANKS

## 2019-06-15 NOTE — Telephone Encounter (Signed)
We have made several attempts to contact this patient today regarding her appointment with no answer. Phone rings once and states that the VM has not been setup.

## 2019-06-16 ENCOUNTER — Telehealth: Payer: Self-pay

## 2019-06-16 NOTE — Telephone Encounter (Addendum)
I called the pt to discuss cost of Entresto but got no answer and was unable to leave a message as the pts VM is full.  I called [facility] and was advised that the pts cost for a 30 day supply of Entresto is $307.29  and that the pts ins is paying their part (no PA required at this time). The pharmacist recommends that the pt contact her ins plan if she does not know why her [name] is this expensive as this maybe due to limitations on the pts plan or this cost may include a deductible.

## 2019-06-16 NOTE — Telephone Encounter (Signed)
FYI.Marland KitchenMarland KitchenMarland KitchenMarland KitchenMarland Kitchen Pt calling stating that she can not afford her medication Entresto and that she did not get a 30 day free card. I advised the pt that I will leave a 30 day free card and a list of number for the medication Entresto for her to call and apply for pt assistant. I informed her that if she has any other questions, problems or concerns, to give our office a call. Pt verbalized understanding.

## 2019-06-17 NOTE — Telephone Encounter (Addendum)
I called the pt at number listed to reach her at but again the pts phone rang 1 time and then straight to her VM. The VM message states that the pts voicemail box is full and to try call again later.

## 2019-06-21 ENCOUNTER — Ambulatory Visit (INDEPENDENT_AMBULATORY_CARE_PROVIDER_SITE_OTHER): Payer: Medicare Other | Admitting: Cardiovascular Disease

## 2019-06-21 ENCOUNTER — Telehealth: Payer: Self-pay | Admitting: *Deleted

## 2019-06-21 ENCOUNTER — Encounter: Payer: Self-pay | Admitting: Cardiovascular Disease

## 2019-06-21 ENCOUNTER — Other Ambulatory Visit: Payer: Self-pay

## 2019-06-21 VITALS — BP 170/102 | HR 75 | Ht 60.0 in | Wt 144.0 lb

## 2019-06-21 DIAGNOSIS — I110 Hypertensive heart disease with heart failure: Secondary | ICD-10-CM | POA: Diagnosis not present

## 2019-06-21 DIAGNOSIS — I10 Essential (primary) hypertension: Secondary | ICD-10-CM

## 2019-06-21 MED ORDER — BIDIL 20-37.5 MG PO TABS
1.0000 | ORAL_TABLET | Freq: Two times a day (BID) | ORAL | 5 refills | Status: DC
Start: 2019-06-21 — End: 2019-08-05

## 2019-06-21 NOTE — Telephone Encounter (Signed)
Patient is scheduled for lab study on 07/06/19. pt is scheduled for COVID screening on 07/04/19 prior to SS.  Patient understands her sleep study will be done at [facility] sleep lab. Patient understands she will receive a sleep packet in a week or so. Patient understands to call if she does not receive the sleep packet in a timely manner. Patient agrees with treatment and thanked me for call.
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
## 2019-06-21 NOTE — Patient Instructions (Addendum)
Medication Instructions:  START BIDIL 1 TABLET TWICE A DAY    Labwork: NONE   Testing/Procedures: NONE   Follow-Up:  Your physician recommends that you schedule a follow-up appointment in: 1 MONTH 08/04/2019 AT 3:30    You will receive a phone call from the PREP exercise and nutrition program to schedule an initial assessment.   Special Instructions:   MONITOR YOU BLOOD PRESSURE TWICE A DAY WITH MACHINE YOU WERE GIVEN. IF YOU NEED ANYTHING PLEASE CALL THE OFFICE OR SEND MYCHART MESSAGE, DO NOT SEND MESSAGE THROUGH [facility] AS IT IS NOT CHECKED DAILY  IF YOU HAVE ANY ISSUES WITH THE MONITOR CONNECTING WITH YOUR PHONE WHEN YOU GET HOME CALL [phone]  DASH Eating Plan DASH stands for "Dietary Approaches to Stop Hypertension." The DASH eating plan is a healthy eating plan that has been shown to reduce high blood pressure (hypertension). It may also reduce your risk for type 2 diabetes, heart disease, and stroke. The DASH eating plan may also help with weight loss. What are tips for following this plan?  General guidelines  Avoid eating more than 2,300 mg (milligrams) of salt (sodium) a day. If you have hypertension, you may need to reduce your sodium intake to 1,500 mg a day.  Limit alcohol intake to no more than 1 drink a day for nonpregnant women and 2 drinks a day for men. One drink equals 12 oz of beer, 5 oz of wine, or 1 oz of hard liquor.  Work with your health care provider to maintain a healthy body weight or to lose weight. Ask what an ideal weight is for you.  Get at least 30 minutes of exercise that causes your heart to beat faster (aerobic exercise) most days of the week. Activities may include walking, swimming, or biking.  Work with your health care provider or diet and nutrition specialist (dietitian) to adjust your eating plan to your individual calorie needs. Reading food labels   Check food labels for the amount of sodium per serving. Choose foods with less  than 5 percent of the Daily Value of sodium. Generally, foods with less than 300 mg of sodium per serving fit into this eating plan.  To find whole grains, look for the word "whole" as the first word in the ingredient list. Shopping  Buy products labeled as "low-sodium" or "no salt added."  Buy fresh foods. Avoid canned foods and premade or frozen meals. Cooking  Avoid adding salt when cooking. Use salt-free seasonings or herbs instead of table salt or sea salt. Check with your health care provider or pharmacist before using salt substitutes.  Do not fry foods. Cook foods using healthy methods such as baking, boiling, grilling, and broiling instead.  Cook with heart-healthy oils, such as olive, canola, soybean, or sunflower oil. Meal planning  Eat a balanced diet that includes: ? 5 or more servings of fruits and vegetables each day. At each meal, try to fill half of your plate with fruits and vegetables. ? Up to 6-8 servings of whole grains each day. ? Less than 6 oz of lean meat, poultry, or fish each day. A 3-oz serving of meat is about the same size as a deck of cards. One egg equals 1 oz. ? 2 servings of low-fat dairy each day. ? A serving of nuts, seeds, or beans 5 times each week. ? Heart-healthy fats. Healthy fats called Omega-3 fatty acids are found in foods such as flaxseeds and coldwater fish, like sardines, salmon, and mackerel.  Limit how much you eat of the following: ? Canned or prepackaged foods. ? Food that is high in trans fat, such as fried foods. ? Food that is high in saturated fat, such as fatty meat. ? Sweets, desserts, sugary drinks, and other foods with added sugar. ? Full-fat dairy products.  Do not salt foods before eating.  Try to eat at least 2 vegetarian meals each week.  Eat more home-cooked food and less restaurant, buffet, and fast food.  When eating at a restaurant, ask that your food be prepared with less salt or no salt, if possible. What  foods are recommended? The items listed may not be a complete list. Talk with your dietitian about what dietary choices are best for you. Grains Whole-grain or whole-wheat bread. Whole-grain or whole-wheat pasta. Brown rice. Modena Morrow. Bulgur. Whole-grain and low-sodium cereals. Pita bread. Low-fat, low-sodium crackers. Whole-wheat flour tortillas. Vegetables Fresh or frozen vegetables (raw, steamed, roasted, or grilled). Low-sodium or reduced-sodium tomato and vegetable juice. Low-sodium or reduced-sodium tomato sauce and tomato paste. Low-sodium or reduced-sodium canned vegetables. Fruits All fresh, dried, or frozen fruit. Canned fruit in natural juice (without added sugar). Meat and other protein foods Skinless chicken or [place]. Ground chicken or [place]. Pork with fat trimmed off. Fish and seafood. Egg whites. Dried beans, peas, or lentils. Unsalted nuts, nut butters, and seeds. Unsalted canned beans. Lean cuts of beef with fat trimmed off. Low-sodium, lean deli meat. Dairy Low-fat (1%) or fat-free (skim) milk. Fat-free, low-fat, or reduced-fat cheeses. Nonfat, low-sodium ricotta or cottage cheese. Low-fat or nonfat yogurt. Low-fat, low-sodium cheese. Fats and oils Soft margarine without trans fats. Vegetable oil. Low-fat, reduced-fat, or light mayonnaise and salad dressings (reduced-sodium). Canola, safflower, olive, soybean, and sunflower oils. Avocado. Seasoning and other foods Herbs. Spices. Seasoning mixes without salt. Unsalted popcorn and pretzels. Fat-free sweets. What foods are not recommended? The items listed may not be a complete list. Talk with your dietitian about what dietary choices are best for you. Grains Baked goods made with fat, such as croissants, muffins, or some breads. Dry pasta or rice meal packs. Vegetables Creamed or fried vegetables. Vegetables in a cheese sauce. Regular canned vegetables (not low-sodium or reduced-sodium). Regular canned tomato sauce and  paste (not low-sodium or reduced-sodium). Regular tomato and vegetable juice (not low-sodium or reduced-sodium). Angie Fava. Olives. Fruits Canned fruit in a light or heavy syrup. Fried fruit. Fruit in cream or butter sauce. Meat and other protein foods Fatty cuts of meat. Ribs. Fried meat. Berniece Salines. Sausage. Bologna and other processed lunch meats. Salami. Fatback. Hotdogs. Bratwurst. Salted nuts and seeds. Canned beans with added salt. Canned or smoked fish. Whole eggs or egg yolks. Chicken or [place] with skin. Dairy Whole or 2% milk, cream, and half-and-half. Whole or full-fat cream cheese. Whole-fat or sweetened yogurt. Full-fat cheese. Nondairy creamers. Whipped toppings. Processed cheese and cheese spreads. Fats and oils Butter. Stick margarine. Lard. Shortening. Ghee. Bacon fat. Tropical oils, such as coconut, palm kernel, or palm oil. Seasoning and other foods Salted popcorn and pretzels. Onion salt, garlic salt, seasoned salt, table salt, and sea salt. Worcestershire sauce. Tartar sauce. Barbecue sauce. Teriyaki sauce. Soy sauce, including reduced-sodium. Steak sauce. Canned and packaged gravies. Fish sauce. Oyster sauce. Cocktail sauce. Horseradish that you find on the shelf. Ketchup. Mustard. Meat flavorings and tenderizers. Bouillon cubes. Hot sauce and Tabasco sauce. Premade or packaged marinades. Premade or packaged taco seasonings. Relishes. Regular salad dressings. Where to find more information:  National Heart, Lung, and Blood  Institute: https://wilson-eaton.com/  American Heart Association: www.heart.org Summary  The DASH eating plan is a healthy eating plan that has been shown to reduce high blood pressure (hypertension). It may also reduce your risk for type 2 diabetes, heart disease, and stroke.  With the DASH eating plan, you should limit salt (sodium) intake to 2,300 mg a day. If you have hypertension, you may need to reduce your sodium intake to 1,500 mg a day.  When on the DASH  eating plan, aim to eat more fresh fruits and vegetables, whole grains, lean proteins, low-fat dairy, and heart-healthy fats.  Work with your health care provider or diet and nutrition specialist (dietitian) to adjust your eating plan to your individual calorie needs. This information is not intended to replace advice given to you by your health care provider. Make sure you discuss any questions you have with your health care provider. Document Released: 12/26/2010 Document Revised: 12/19/2016 Document Reviewed: 12/31/2015 Elsevier Patient Education  2020 [employer].

## 2019-06-22 ENCOUNTER — Telehealth: Payer: Self-pay

## 2019-06-22 NOTE — Telephone Encounter (Signed)
Call placed to patient reference referral to PREP.  Interested in program Can start 6/8  Intake scheduled for 6/3 at 2pm at [facility]

## 2019-06-23 ENCOUNTER — Telehealth: Payer: Self-pay | Admitting: *Deleted

## 2019-06-23 ENCOUNTER — Encounter: Payer: Self-pay | Admitting: Cardiovascular Disease

## 2019-06-23 ENCOUNTER — Telehealth: Payer: Self-pay | Admitting: Licensed Clinical Social Worker

## 2019-06-23 NOTE — Telephone Encounter (Signed)
Call placed to pt re: message below. Per pt, she still has the jugs, but never collected urine and turned them in. Pt will reach out to [facility] for instructions again and get that done.
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
## 2019-06-23 NOTE — Telephone Encounter (Signed)
-----   Message from Charlie Pitter, [place] sent at 06/23/2019  1:24 PM EDT ----- Regarding: Urine tests Hey, Can you look into some labs for me? I do not see that her urine metanephrines/catecholamines had resulted from when we ordered them in May. This is the patient that there are a ton of phone notes from when we tried to call her about her labs. [place] has a note that she was finally able to call from her private cell because the patient has something on her phone that would block J[employer] or office phone at times. There was a phone note that said "Blodwen is calling wanting to know when she should take her urine sample" - so I wonder if she did it and took it somewhere, or just disregarded? Dr. Oval Linsey saw her in the HTN clinic and I think was interested in seeing the result as well since her BP was still really high then.  I also seen an Entresto phone note that Jeani Hawking was trying to reach her for but couldn't, need to find out if she was able to get it. Thanks!

## 2019-06-23 NOTE — Telephone Encounter (Signed)
CSW received referral to assist patient with medication assistance. Patient reports she is unsure the name of the medication and [facility]. Patient will return call to [place] later today with the name of medication needed. Patient states she has a 15 day supply of the med at the moment. Raquel Sarna, [place], [place]

## 2019-06-24 ENCOUNTER — Telehealth: Payer: Self-pay | Admitting: Cardiology

## 2019-06-24 NOTE — Telephone Encounter (Signed)
Returned call to pt.  She will call over to [facility] to get instructions with the jugs for her urine collection.

## 2019-06-24 NOTE — Telephone Encounter (Signed)
New Message   Pt is calling and has questions about how she needs to do her labs    Please call

## 2019-06-28 ENCOUNTER — Telehealth: Payer: Self-pay

## 2019-06-28 ENCOUNTER — Telehealth: Payer: Self-pay | Admitting: Licensed Clinical Social Worker

## 2019-06-28 ENCOUNTER — Telehealth: Payer: Self-pay | Admitting: *Deleted

## 2019-06-28 MED ORDER — VALSARTAN 160 MG PO TABS: 160.0000 mg | ORAL_TABLET | Freq: Every day | ORAL | 3 refills | Status: DC

## 2019-06-28 NOTE — Telephone Encounter (Signed)
**Note De-Identified  Obfuscation** I offered Pt Asst through [employer] but she is not interested in applying.

## 2019-06-28 NOTE — Telephone Encounter (Signed)
I called patient and explained that we will not be leaving samples since she is not interested in applying for patient assistance and they will be switching to Valsartan. Patient has about 5 days worth of Entresto left, therefore I will go ahead and send over rx for valsartan and have pharmacy cancel rx for entresto.

## 2019-06-28 NOTE — Telephone Encounter (Signed)
Can we contact our rep Hinton Dyer for a assistance program? Thank you, KN

## 2019-06-28 NOTE — Telephone Encounter (Signed)
**Note De-identified  Obfuscation** -----  **Note De-Identified  Obfuscation** Message from Earvin Hansen, LPN sent at 03/21/2565  4:57 PM EDT ----- Wyonia Hough  So Kennyth Lose reached out to me about this patient who is a Dr Meda Coffee patient. She is needing samples of Entresto the 49 mg (taking 2 BID) and Patient Assistance started. Melissa Pharm D said doubling dose ok until get her approved. Patient only has 10 tablets left. I was hoping you could work on patient assistance and then make sure she gets samples and forms tomorrow. Hope you are doing well Thanks Rip Harbour

## 2019-06-28 NOTE — Telephone Encounter (Addendum)
Message sent to Dr. Meda Coffee to clarify dosing of Valsartan.  Dose of Valsartan 150 mg is not found.  Asked for clarification dosing of Valsartan of this med.  Strengths available to that dose is Valsartan 160 mg po daily.  Will await clarification about this.  CLARIFICATION ORDERED RECEIVED BY DR. Meda Coffee, AND PT SHOULD TAKE VALSARTAN 160 MG PO DAILY.  PT INFORMED OF THIS.   Spoke with the pt and endorsed to her Dr. Francesca Oman recommendations based on her Delene Loll not being cost effective for her budget. Informed her that Dr. Meda Coffee advised if she can't afford the [facility], we can consider changing her to another alternative, Valsartan 160 mg po daily instead.   Pt states she wants to finish out her Entresto samples that were picked up from the office today.  She states she will call us back the day before her samples run out, so that we can call in the new alternative Valsartan thereafter, to her confirmed pharmacy of choice.  Informed the pt that would be fine, and I will await her call back when her samples of Entresto are low, then send in the new regimen, Valsartan 160 mg po daily thereafter, to her confirmed pharmacy of choice.  Pt verbalized understanding and agrees with this plan. Pt states she has made herself a note to call us back when she is down to only a days worth of her Entresto samples.  Pt was very appreciative for all the assistance our office has provided with her care.

## 2019-06-28 NOTE — Telephone Encounter (Signed)
-----   Message from Dorothy Spark, MD sent at 06/28/2019 12:10 PM EDT ----- Regarding: RE: dosing of Valsartan Yes!  160 mg po daily, thank you!\ ----- Message ----- From: Nuala Alpha, LPN Sent: 02/26/7822  23:53 PM EDT To: Dorothy Spark, MD Subject: dosing of Valsartan                            You wanted her to switch her Entresto to Valsartan 150 mg po daily.  I can't find it in that dose but I do see it comes in 160 mg strength.  Ok to send in for that dose?  Thanks,  [facility]

## 2019-06-28 NOTE — Telephone Encounter (Signed)
I would switch to Valsartan 150 mg po daily

## 2019-06-28 NOTE — Addendum Note (Signed)
Addended by: Marcelle Overlie D on: 06/28/2019 04:14 PM   Modules accepted: Orders

## 2019-06-28 NOTE — Addendum Note (Signed)
Addended by: Nuala Alpha on: 06/28/2019 05:05 PM   Modules accepted: Orders

## 2019-06-28 NOTE — Telephone Encounter (Signed)
Patient is in the process of applying to patient assistance for [facility]. Only has a few days left. Will leave samples for [employer]. Patient advised that they will be the 49/51mg  tablets, therefore she will need to take 2 tablets by mouth twice a day. Two bottles (14 day supply left at the dest downstairs at [address] office)

## 2019-06-28 NOTE — Telephone Encounter (Signed)
I was asked to leave samples of Entresto for patient. I also left a patient assistance application. Patient and her husband were made aware. This was before I saw this telephone encounter. I will put the samples back to stock

## 2019-06-28 NOTE — Telephone Encounter (Signed)
**Note De-Identified  Obfuscation** I was able to reach the pt this morning. She states that she just started taking Entresto in April this year but her med list states that she started taking it on 12/30/2018 at a hospital d/c.  She states that Delene Loll will cost her over $300 a month and she cannot afford that. I advised her that [facility] that that is the correct cost and that a PA is not require at this time and that she may owe a deductible.  I asked if she has a deductible and she states "I think so" so I encouraged her several times to contact her ins provider to find out if she does but she is not interested in calling them.   I discussed switching to a less costing medication and she is in agreement with this as she states Delene Loll works well but I cannot afford it.  She is aware that I am forwarding this message to Dr Meda Coffee and her nurse for recommendation to the pt.

## 2019-06-28 NOTE — Telephone Encounter (Signed)
**Note De-Identified  Obfuscation** Julia Spark, MD to Nuala Alpha, LPN      84:03 AM Note   I would switch to Valsartan 150 mg po daily

## 2019-06-28 NOTE — Telephone Encounter (Signed)
Thank you Jeani Hawking, I appreciate it!

## 2019-06-28 NOTE — Telephone Encounter (Signed)
Entered in error

## 2019-06-28 NOTE — Telephone Encounter (Deleted)
**Note De-Identified  Obfuscation** I was able to reach the pt this morning. She states that she just started taking Entresto in April this year but her med list states that she started taking it on 12/30/2018 at a hospital d/c.  She states that Delene Loll will cost her over $300 a month and she cannot afford that. I advised her that [facility] is the correct cost and that a PA is not require at this time and that she may owe a deductible.  I asked if she has a deductible and she states "I think so" so I encouraged her several times to contact her ins provider to find out if she does but she is not interested in calling them.  I discussed switching to a less costing medication and she is in agreement with this as she states Delene Loll works well but I cannot afford it.  Will forward this phone note to Dr Meda Coffee and her nurse for recommendations to the pt.

## 2019-06-28 NOTE — Telephone Encounter (Signed)
Will discontinue Entresto from pts med list.

## 2019-06-28 NOTE — Telephone Encounter (Deleted)
**Note De-identified  Obfuscation** -----  **Note De-Identified  Obfuscation** Message from Earvin Hansen, LPN sent at 01/23/2765  4:57 PM EDT ----- [name]  So Kennyth Lose reached out to me about this patient who is a Dr Meda Coffee patient. She is needing samples of Entresto the 49 mg (taking 2 BID) and Patient Assistance started. Melissa Pharm D said doubling dose ok until get her approved. Patient only has 10 tablets left. I was hoping you could work on patient assistance and then make sure she gets samples and forms tomorrow. Hope you are doing well Thanks Rip Harbour

## 2019-06-28 NOTE — Telephone Encounter (Signed)
**Note De-Identified  Obfuscation** Please see phone note from 06/10/2019 for details.

## 2019-07-04 ENCOUNTER — Other Ambulatory Visit (HOSPITAL_COMMUNITY): Payer: Medicare Other

## 2019-07-05 NOTE — Progress Notes (Signed)
[facility] [employer] PREP Weekly Session   Patient Details  Name: [name] MRN: [id number] Date of Birth: 04-Oct-1950 Age: 68 y.o. PCP: Isaac Bliss, Rayford Halsted, MD  Vitals:   07/05/19 1555  BP: (!) 157/104  Weight: 136 lb (61.7 kg)     [facility] Weekly seesion - 07/05/19 1500      Weekly Session   Topic Discussed Importance of resistance training;Other ways to be active    Minutes exercised this week 75 minutes    Classes attended to date 3          BP noted. Unable to leave a VM, sent text message to confirm BP with meds or without. Asked if reporting to HTN clinic.   Fun things since last meeting: dancing with my granddaughter Grateful for: my family Nutrition celebration: no salt, 1/2 sandwich Barriers/struggles: keeping up with my blood pressure.   Barnett Hatter 07/05/2019, 3:58 PM

## 2019-07-06 ENCOUNTER — Encounter (HOSPITAL_BASED_OUTPATIENT_CLINIC_OR_DEPARTMENT_OTHER): Payer: Medicare Other | Admitting: Cardiology

## 2019-07-19 NOTE — Progress Notes (Signed)
[facility] PREP Weekly Session   Patient Details  Name: [name] MRN: [id number] Date of Birth: 06/05/1950 Age: 69 y.o. PCP: Isaac Bliss, Rayford Halsted, MD  Vitals:   07/19/19 1623  BP: 136/79     [facility] [employer] Weekly seesion - 07/19/19 1600      Weekly Session   Topic Discussed Health habits    Minutes exercised this week --   none recorded    Classes attended to date 6         Fun things since last meeting: walking and talking at same time.  Grateful for: Going out with my BFFs this weekend Nutrition celebration: I ate everything without salt Barriers/struggles: None!!! Just Carolynne Edouard 07/19/2019, 4:27 PM

## 2019-07-20 ENCOUNTER — Other Ambulatory Visit: Payer: Self-pay | Admitting: Internal Medicine

## 2019-07-20 DIAGNOSIS — I5042 Chronic combined systolic (congestive) and diastolic (congestive) heart failure: Secondary | ICD-10-CM

## 2019-07-20 DIAGNOSIS — I10 Essential (primary) hypertension: Secondary | ICD-10-CM | POA: Diagnosis not present

## 2019-07-20 DIAGNOSIS — K580 Irritable bowel syndrome with diarrhea: Secondary | ICD-10-CM

## 2019-07-20 DIAGNOSIS — R1084 Generalized abdominal pain: Secondary | ICD-10-CM

## 2019-07-20 DIAGNOSIS — F339 Major depressive disorder, recurrent, unspecified: Secondary | ICD-10-CM

## 2019-07-26 NOTE — Progress Notes (Signed)
[facility] PREP Weekly Session   Patient Details  Name: [name] MRN: [id number] Date of Birth: 07/02/1950 Age: 69 y.o. PCP: Isaac Bliss, Rayford Halsted, MD  Vitals:   07/26/19 1641  Weight: 135 lb (61.2 kg)     [facility] [employer] Weekly seesion - 07/26/19 1600      Weekly Session   Topic Discussed Restaurant Eating    Classes attended to date 8          Fun things since last meeting: didn't do anything Grateful for: my family and friends Nutrition celebration: no salt/no sugar   Barnett Hatter 07/26/2019, 4:42 PM

## 2019-07-28 ENCOUNTER — Telehealth: Payer: Self-pay | Admitting: *Deleted

## 2019-07-28 DIAGNOSIS — I10 Essential (primary) hypertension: Secondary | ICD-10-CM

## 2019-07-28 NOTE — Telephone Encounter (Signed)
-----   Message from Skeet Latch, MD sent at 07/27/2019  6:58 AM EDT ----- BP running high in [place].  Recommend increasing valsartan to 320mg .  Check BMP in a week.  Keep tracking BP.

## 2019-07-28 NOTE — Telephone Encounter (Signed)
Advised patient and she will keep appointment next week

## 2019-08-02 NOTE — Progress Notes (Signed)
[facility] PREP Weekly Session   Patient Details  Name: [name] MRN: [id number] Date of Birth: 07/21/1950 Age: 69 y.o. PCP: Isaac Bliss, Rayford Halsted, MD  Vitals:   08/02/19 1623  Weight: 130 lb (59 kg)     [facility] [employer] Weekly seesion - 08/02/19 1600      Weekly Session   Topic Discussed Stress management and problem solving    Classes attended to date 62          Fun things since last meeting: kids birthday party Grateful for: family  Nutrition celebration: no salt, no sugar, No candy    Barnett Hatter 08/02/2019, 4:37 PM

## 2019-08-04 ENCOUNTER — Ambulatory Visit: Payer: Medicare Other
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
## 2019-08-05 ENCOUNTER — Other Ambulatory Visit: Payer: Self-pay | Admitting: Pharmacist

## 2019-08-05 MED ORDER — VALSARTAN 320 MG PO TABS: 320.0000 mg | ORAL_TABLET | Freq: Every day | ORAL | 11 refills | Status: DC

## 2019-08-05 MED ORDER — SPIRONOLACTONE 25 MG PO TABS: 12.5000 mg | ORAL_TABLET | Freq: Every day | ORAL | 3 refills | Status: DC

## 2019-08-05 NOTE — Telephone Encounter (Signed)
Medication list updated today. [facility] completed over the phone with patient and verified with prefer pharmacy [facility])  Patient not taking:   BiDil (never picked up from pharmacy - Rx currently on HOLD)   Valsartan 320mg  - no active prescription at pharmacy    Dose discrepancy:    Taking carvedilol 12.5mg  BID  (NOT 25mg )    Taking spironolactone 12.5mg  daily (NOT 25mg  daily)   Patient instructed to start Valsartan 320mg  TODAY, continue carvedilol 12.5mg  BID and spironolactone 12.5mg  daily. Okay to continue furosemide 20mg  daily as well.  Noted appointment for HTN follow up already scheduled for 7/20 @ 8:30am - [facility]

## 2019-08-08 ENCOUNTER — Telehealth: Payer: Self-pay | Admitting: Pharmacist

## 2019-08-08 NOTE — Telephone Encounter (Signed)
Weekly BP readings from [place] are scanned in media tab. Readings for the past week that haven't been scanned in yet are as follows: 173/108, 227/114, 175/115, 149/103, and 152/109. Appt tomorrow in HTN clinic.

## 2019-08-09 ENCOUNTER — Other Ambulatory Visit: Payer: Self-pay

## 2019-08-09 ENCOUNTER — Encounter: Payer: Self-pay | Admitting: Pharmacist

## 2019-08-09 ENCOUNTER — Ambulatory Visit (INDEPENDENT_AMBULATORY_CARE_PROVIDER_SITE_OTHER): Payer: Medicare Other | Admitting: Pharmacist

## 2019-08-09 VITALS — BP 158/90 | HR 67 | Wt 141.4 lb

## 2019-08-09 DIAGNOSIS — I110 Hypertensive heart disease with heart failure: Secondary | ICD-10-CM

## 2019-08-09 MED ORDER — SPIRONOLACTONE 25 MG PO TABS: 25.0000 mg | ORAL_TABLET | Freq: Every day | ORAL | 1 refills | Status: DC
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
## 2019-08-09 NOTE — Patient Instructions (Signed)
It was great meeting you today.  We would like your blood pressure to be less than 130/80  Continue your carvedilol, furosemide and pick up your valsartan today from the pharmacy  We are going to increase your spironolactone to 25 mg daily  Check blood work in 1 week  Please call with any questions.  Karren Cobble, PharmD, BCACP, [place] [address], [place], [place] [address] Phone: [phone]; Fax: [phone] 08/09/2019 9:08 AM

## 2019-08-09 NOTE — Progress Notes (Signed)
[facility] [employer] PREP Weekly Session   Patient Details  Name: [name] MRN: [id number] Date of Birth: 1950-04-17 Age: 69 y.o. PCP: Isaac Bliss, Rayford Halsted, MD  Vitals:   08/09/19 1539  Weight: 134 lb (60.8 kg)     [facility] [employer] Weekly seesion - 08/09/19 1500      Weekly Session   Topic Discussed Expectations and non-scale victories    Minutes exercised this week 60 minutes    Classes attended to date 69         Grateful for: coming to the [facility] Nutrition Celebration: no salt, no sugar    Barnett Hatter 08/09/2019, 3:40 PM

## 2019-08-16 ENCOUNTER — Other Ambulatory Visit: Payer: Medicare Other

## 2019-08-16 NOTE — Progress Notes (Signed)
[facility] PREP Weekly Session   Patient Details  Name: [name] MRN: [id number] Date of Birth: [birth year] Age: 69 y.o. PCP: Isaac Bliss, Rayford Halsted, MD  There were no vitals filed for this visit.   [facility] Weekly seesion - 08/16/19 1600      Weekly Session   Topic Discussed Restaurant Eating    Minutes exercised this week 120 minutes    Classes attended to date 44          Fun things since last meeting: playing games at brithday party Grateful for: my rainbow friends Nutrition celebration: no salt, no sugar Barriers/struggles: none   Barnett Hatter 08/16/2019, 4:16 PM

## 2019-08-17 ENCOUNTER — Telehealth: Payer: Self-pay | Admitting: Pharmacist Clinician (PhC)/ Clinical Pharmacy Specialist

## 2019-08-17 NOTE — Telephone Encounter (Signed)
Called patient, BP elevated in [place] today.  She denies increase in sodium or other dietary indiscretions over past few days.  Has just started new antidepressant (escitalopram).   Will continue to monitor, no changes today.  Patient appreciative of call

## 2019-08-19 DIAGNOSIS — I10 Essential (primary) hypertension: Secondary | ICD-10-CM | POA: Diagnosis not present

## 2019-08-23 NOTE — Progress Notes (Signed)
[facility] PREP Weekly Session   Patient Details  Name: [name] MRN: [id number] Date of Birth: [birth year] Age: 69 y.o. PCP: Isaac Bliss, Rayford Halsted, MD  Vitals:   08/23/19 1549  BP: (!) 137/100  Pulse: 71  Weight: 136 lb (61.7 kg)     [facility] Weekly seesion - 08/23/19 1500      Weekly Session   Topic Discussed Finding support    Minutes exercised this week 90 minutes    Classes attended to date 89          Fun things since last meeting: car show with family Grateful for: everything that is Good Nutrition celebration: no sugar, no salt   Pam Tally Joe 08/23/2019, 3:50 PM

## 2019-08-31 ENCOUNTER — Other Ambulatory Visit: Payer: Self-pay | Admitting: Cardiology

## 2019-08-31 DIAGNOSIS — I110 Hypertensive heart disease with heart failure: Secondary | ICD-10-CM

## 2019-08-31 NOTE — Progress Notes (Signed)
[facility] PREP Weekly Session   Patient Details  Name: [name] MRN: [id number] Date of Birth: Mar 02, 1950 Age: 69 y.o. PCP: Isaac Bliss, Rayford Halsted, MD  Vitals:   08/31/19 1042  BP: 125/80  Pulse: 90  Weight: 136 lb (61.7 kg)     [facility] Weekly seesion - 08/31/19 1000      Weekly Session   Topic Discussed Calorie breakdown    Minutes exercised this week 90 minutes    Classes attended to date 16          Class on 08/30/19 Grateful for: going to church with friends Ate sweet potato pie but only had half slice, shared with husband.   Barnett Hatter 08/31/2019, 10:44 AM

## 2019-09-12 ENCOUNTER — Telehealth: Payer: Self-pay

## 2019-09-12 DIAGNOSIS — Z0000 Encounter for general adult medical examination without abnormal findings: Secondary | ICD-10-CM

## 2019-09-12 NOTE — Telephone Encounter (Signed)
Called patient to discuss BP checks with Viv[employer]. Patient does not have a voicemail setup to leave msg.
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
## 2019-10-07 ENCOUNTER — Telehealth: Payer: Self-pay

## 2019-10-07 DIAGNOSIS — Z0000 Encounter for general adult medical examination without abnormal findings: Secondary | ICD-10-CM

## 2019-10-07 NOTE — Telephone Encounter (Signed)
Attempted to call patient to discuss missed BP readings or any issues with connectivity with Vivify. Was not able to leave a message because mailbox is not setup.

## 2019-10-11 NOTE — Addendum Note (Signed)
Addended by: Mike Craze on: 10/11/2019 11:20 AM   Modules accepted: Orders

## 2019-10-17 ENCOUNTER — Telehealth: Payer: Self-pay

## 2019-10-17 DIAGNOSIS — Z0000 Encounter for general adult medical examination without abnormal findings: Secondary | ICD-10-CM

## 2019-10-17 NOTE — Telephone Encounter (Signed)
Called patient to reschedule appt with PharmD. Transferred patient to [facility] for scheduling.

## 2019-10-23 ENCOUNTER — Other Ambulatory Visit: Payer: Self-pay | Admitting: Internal Medicine

## 2019-10-23 DIAGNOSIS — R1084 Generalized abdominal pain: Secondary | ICD-10-CM

## 2019-11-04 ENCOUNTER — Telehealth: Payer: Self-pay

## 2019-11-04 DIAGNOSIS — Z0000 Encounter for general adult medical examination without abnormal findings: Secondary | ICD-10-CM

## 2019-11-04 NOTE — Telephone Encounter (Signed)
Called patient to discuss checking BP with Vivify app. Patient stated that her schedule has been very busy and that is why patient has not been checking her BP. Patient is currently under a lot of stress at this time, but will work on getting back into the routine of checking it.  Patient was asked if she would like to speak to a therapist and the patient declined. Patient stated that she would rather have an appointment with Dr. Meda Coffee.  Care Guide will send Dr. Meda Coffee a message to inform her of patient's current state and request.

## 2019-11-08 ENCOUNTER — Telehealth: Payer: Self-pay

## 2019-11-08 DIAGNOSIS — Z0000 Encounter for general adult medical examination without abnormal findings: Secondary | ICD-10-CM

## 2019-11-08 NOTE — Telephone Encounter (Signed)
Called patient to follow up with patient regarding request for an appointment with Dr. Ena Dawley. Patient was unavailable at home per husband. Called mobile number and was unable to leave a message. Will call patient on 11/09/19.

## 2019-11-09 ENCOUNTER — Telehealth: Payer: Self-pay | Admitting: General Practice

## 2019-11-09 NOTE — Telephone Encounter (Signed)
-----   Message from Nuala Alpha, LPN sent at 14/97/0263  7:30 AM EDT ----- Scheduling, could you please help me out and call this pt per Dr. Meda Coffee, and schedule her out for 02/09/19 with Meda Coffee, any open slot?  I greatly appreciate it.  Thanks for all you do, Ivy  ----- Message ----- From: Dorothy Spark, MD Sent: 11/05/2019  10:48 AM EDT To: Nuala Alpha, LPN  Ivy, could you add her to out 1/20 schedule?Thank you, K

## 2019-11-09 NOTE — Telephone Encounter (Signed)
LMTCB with her husband. See note below.

## 2019-11-14 ENCOUNTER — Ambulatory Visit: Payer: Medicare Other
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
## 2019-12-01 ENCOUNTER — Telehealth: Payer: Self-pay

## 2019-12-01 DIAGNOSIS — Z0000 Encounter for general adult medical examination without abnormal findings: Secondary | ICD-10-CM

## 2019-12-01 NOTE — Telephone Encounter (Signed)
Called patient to follow up on not checking BP and completing pathways within Vivify app for several weeks. Patient stated that she does not have the time because of the many responsibilities and chaos in her life at this moment. Patient inquired about an appointment with her PCP and stated that she did not receive a call. Patient was interrupted before health coaching could begin on strategies to increase the likelihood of her checking her BP daily. Patient asked to be called back on 12/02/19.  Patient will be updated on the call that was made to schedule an appointment for 02/09/20 with Dr. Meda Coffee as she requested.   Ermelinda Das   YO  11/09/19 1:55 PM Note LMTCB with her husband. See note below.      YO  11/09/19 1:55 PM Ermelinda Das attempted to contact [place], Ceanna (Left Message)

## 2019-12-09 ENCOUNTER — Telehealth: Payer: Self-pay

## 2019-12-09 DIAGNOSIS — Z0000 Encounter for general adult medical examination without abnormal findings: Secondary | ICD-10-CM

## 2019-12-09 NOTE — Telephone Encounter (Signed)
Called patient to inform her that the 16 week program had been completed and that she no longer had to track her BP in vivify, but to keep a paper log of her BP for follow up appointments. Unable to leave patient a message will call patient on 12/12/19.

## 2019-12-12 IMAGING — CT CT HEART MORP W/ CTA COR W/ SCORE W/ CA W/CM &/OR W/O CM
4 of 7 series · 8 of 20 positions shown, 9 images · non-contrast
Comparison: None.
COMPARISON: None.

Addendum:
EXAM:
OVER-READ INTERPRETATION  CT CHEST

The following report is an over-read performed by radiologist Dr.
Martina Miki Bujnoch [REDACTED] on 12/30/2018. This
over-read does not include interpretation of cardiac or coronary
anatomy or pathology. The coronary calcium score/coronary CTA
interpretation by the cardiologist is attached.
CLINICAL DATA: 68F with acute systolic heart failure and chest
pain.
Cardiac/Coronary  CT
TECHNIQUE: The patient was scanned on a Phillips Force scanner.

[Series 6: best diast 73 % · axial · 0.39mm/px · z∈[+1019,+1065]mm · 2 of 343 slices shown, 3 images]
[im 115/343  vessel]
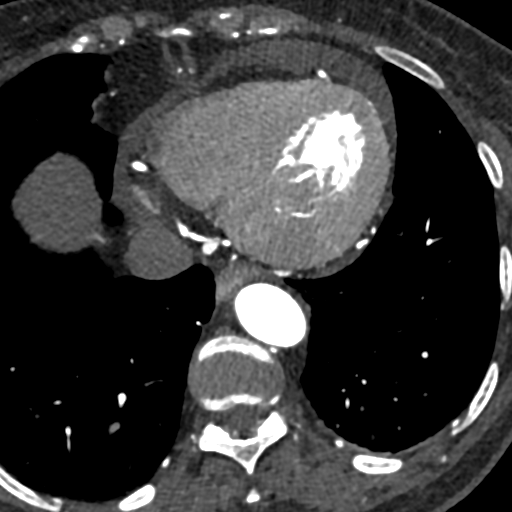
[im 115/343  lung]
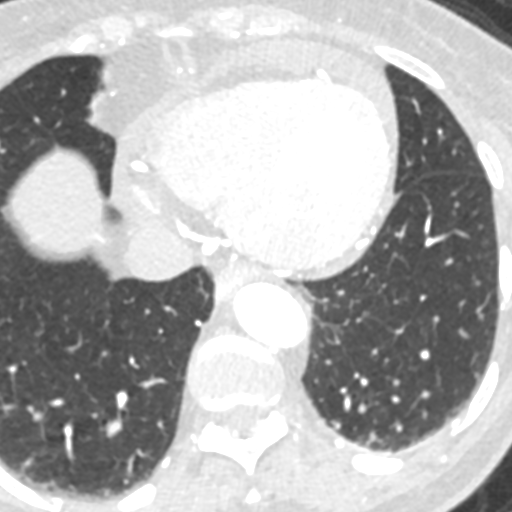
[im 229/343  vessel]
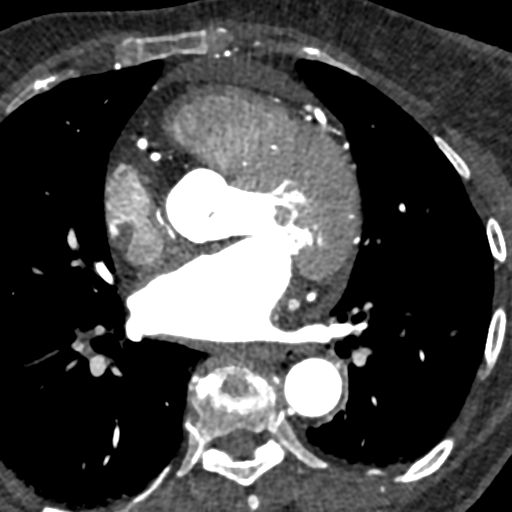

[Series 7: best syst 34 % · axial · 0.39mm/px · z∈[+1019,+1065]mm · 2 of 343 slices shown]
[im 115/343  vessel]
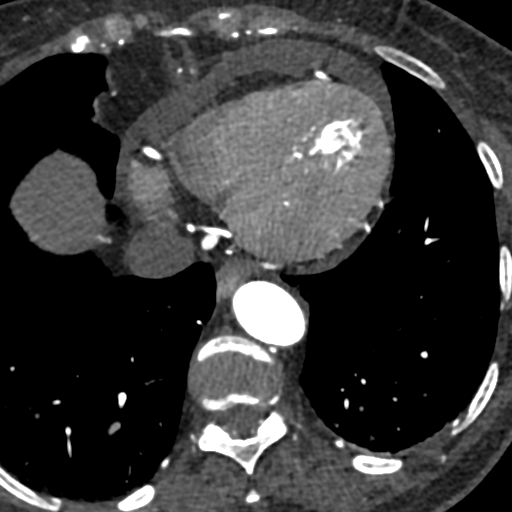
[im 229/343  vessel]
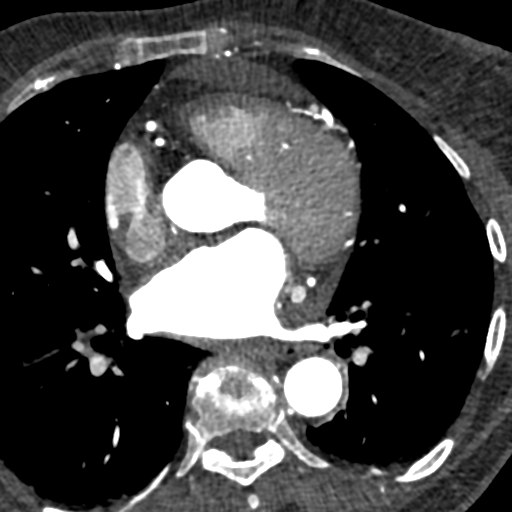

[Series 8: ts diast sharp 34 % · axial · 0.39mm/px · z∈[+1019,+1065]mm · 2 of 343 slices shown]
[im 115/343  lung]
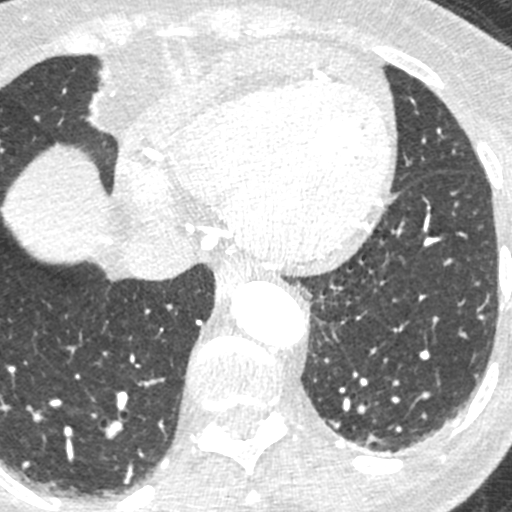
[im 229/343  lung]
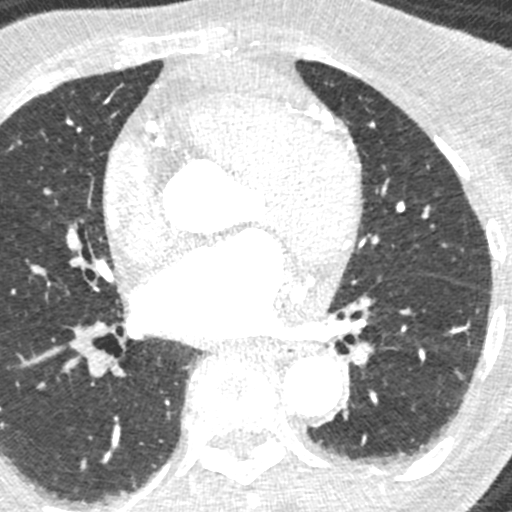

[Series 9: ts syst sharp 34 % · axial · 0.39mm/px · z∈[+1019,+1065]mm · 2 of 343 slices shown]
[im 115/343  lung]
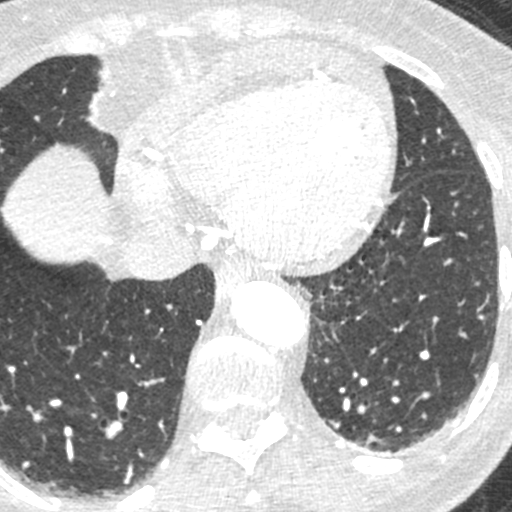
[im 229/343  lung]
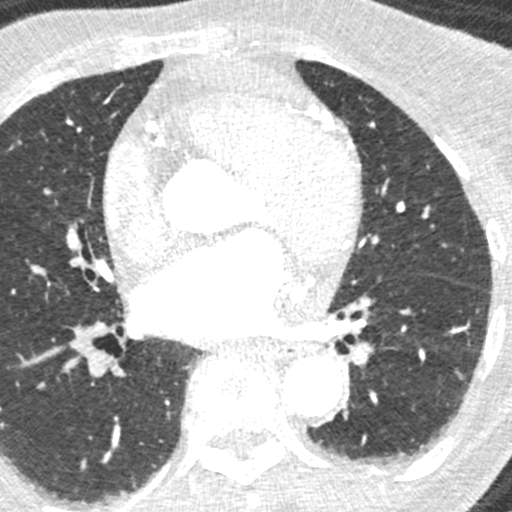

[8 of 20 positions shown; findings below may reference images not displayed]

FINDINGS: Aortic atherosclerosis. 3 mm nodule in the inferior segment of the
lingula (axial image 24 of series 12), stable compared to prior CT
the abdomen and pelvis 03/11/2012, considered definitively benign.
Within the visualized portions of the thorax there are no suspicious
appearing pulmonary nodules or masses, there is no acute
consolidative airspace disease, no pleural effusions, no
pneumothorax and no lymphadenopathy. Visualized portions of the
upper abdomen are unremarkable. There are no aggressive appearing
lytic or blastic lesions noted in the visualized portions of the
skeleton.
IMPRESSION: 1.  Aortic Atherosclerosis (9VL2X-IP3.3).
FINDINGS: A 120 kV prospective scan was triggered in the descending thoracic
aorta at 111 HU's. Axial non-contrast 3 mm slices were carried out
through the heart. The data set was analyzed on a dedicated work
station and scored using the Agatson method. Gantry rotation speed
was 250 msecs and collimation was .6 mm. No beta blockade and 0.8 mg
of sl NTG was given. The 3D data set was reconstructed in 5%
intervals of the 67-82 % of the R-R cycle. Diastolic phases were
analyzed on a dedicated work station using MPR, MIP and VRT modes.
The patient received 80 cc of contrast.

Aorta: Normal size. Ascending aorta 2.9 cm. No calcifications. No
dissection.

Aortic Valve:  Trileaflet.  No calcifications.

Coronary Arteries:  Normal coronary origin.  Right dominance.

RCA is a large dominant artery that gives rise to PDA and two KRPTO
branches. There is no plaque.

Left main is a large artery that gives rise to LAD, LCX, and RI
arteries. There is minimal (<25%) calcified plaque distally.

LAD is a large vessel that has no plaque. There is a small D1, small
D2, normal D3 and D4 without plaque.

LCX is a non-dominant artery that gives rise to a small OM1 and
large OMs branches. There is no plaque.

RI is a large, branching vessel without plaque. One of the RI
branches forms a long, myocardial bridge.

Other findings:

Normal pulmonary vein drainage into the left atrium.

Normal let atrial appendage without a thrombus.

Normal size of the pulmonary artery.
IMPRESSION: 1. Coronary calcium score of 12.4. This was 65th percentile for age
and sex matched control. However, this calcium could not be
correlated on coronary CT-A. Suspect artifact.

2. Normal coronary origin with right dominance.

3. No evidence of CAD.

*** End of Addendum ***
EXAM:
OVER-READ INTERPRETATION  CT CHEST

The following report is an over-read performed by radiologist Dr.
Martina Miki Bujnoch [REDACTED] on 12/30/2018. This
over-read does not include interpretation of cardiac or coronary
anatomy or pathology. The coronary calcium score/coronary CTA
interpretation by the cardiologist is attached.
FINDINGS: Aortic atherosclerosis. 3 mm nodule in the inferior segment of the
lingula (axial image 24 of series 12), stable compared to prior CT
the abdomen and pelvis 03/11/2012, considered definitively benign.
Within the visualized portions of the thorax there are no suspicious
appearing pulmonary nodules or masses, there is no acute
consolidative airspace disease, no pleural effusions, no
pneumothorax and no lymphadenopathy. Visualized portions of the
upper abdomen are unremarkable. There are no aggressive appearing
lytic or blastic lesions noted in the visualized portions of the
skeleton.
IMPRESSION: 1.  Aortic Atherosclerosis (9VL2X-IP3.3).
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
## 2019-12-19 ENCOUNTER — Telehealth: Payer: Self-pay | Admitting: Internal Medicine

## 2019-12-19 ENCOUNTER — Telehealth: Payer: Self-pay | Admitting: Cardiology

## 2019-12-19 NOTE — Telephone Encounter (Signed)
error 

## 2019-12-19 NOTE — Telephone Encounter (Signed)
Patient has virtual appt tomorrow with Melina Copa. She wants to know what number will be calling her because her phone keeps blocking numbers that arent recognized. She asks that you please send her a text to let her know.

## 2019-12-19 NOTE — Telephone Encounter (Signed)
Tried to call pt back.. # is blocked and goes straight to a voicemail that hasn't been set up and couldn't leave a message. Called home # on file and a gentleman got pt on phone and she said "can I call you right back" and I explained to her that I wouldn't be able to get in touch with her as our # is blocked.  Pt said, "I will unblock it" and hung up.

## 2019-12-20 ENCOUNTER — Telehealth: Payer: Self-pay | Admitting: *Deleted

## 2019-12-20 ENCOUNTER — Telehealth (INDEPENDENT_AMBULATORY_CARE_PROVIDER_SITE_OTHER): Payer: Medicare Other | Admitting: Physician Assistant

## 2019-12-20 ENCOUNTER — Other Ambulatory Visit: Payer: Self-pay

## 2019-12-20 ENCOUNTER — Encounter: Payer: Self-pay | Admitting: Physician Assistant

## 2019-12-20 VITALS — BP 149/72 | HR 83 | Ht 60.0 in | Wt 135.0 lb

## 2019-12-20 DIAGNOSIS — I5042 Chronic combined systolic (congestive) and diastolic (congestive) heart failure: Secondary | ICD-10-CM

## 2019-12-20 DIAGNOSIS — I10 Essential (primary) hypertension: Secondary | ICD-10-CM | POA: Diagnosis not present

## 2019-12-20 DIAGNOSIS — I272 Pulmonary hypertension, unspecified: Secondary | ICD-10-CM | POA: Diagnosis not present

## 2019-12-20 DIAGNOSIS — Z659 Problem related to unspecified psychosocial circumstances: Secondary | ICD-10-CM

## 2019-12-20 DIAGNOSIS — N1831 Chronic kidney disease, stage 3a: Secondary | ICD-10-CM

## 2019-12-20 DIAGNOSIS — I428 Other cardiomyopathies: Secondary | ICD-10-CM | POA: Diagnosis not present

## 2019-12-20 NOTE — Patient Instructions (Signed)
Medication Instructions:  Your physician has recommended you make the following change in your medication:  1.  INCREASE the Carvedilol to 25 mg taking 1 tablet twice a day   *If you need a refill on your cardiac medications before your next appointment, please call your pharmacy*   Lab Work: 12/26/19:  COME TO THE OFFICE FOR BMET.. YOU MAY NEED TO GO DOWNSTAIRS FOR THE URINE TESTS.  If you have labs (blood work) drawn today and your tests are completely normal, you will receive your results only by: Marland Kitchen MyChart Message (if you have MyChart) OR . A paper copy in the mail If you have any lab test that is abnormal or we need to change your treatment, we will call you to review the results.   Testing/Procedures: None ordered    Follow-Up: At [facility], you and your health needs are our priority.  As part of our continuing mission to provide you with exceptional heart care, we have created designated Provider Care Teams.  These Care Teams include your primary Cardiologist (physician) and Advanced Practice Providers (APPs -  Physician Assistants and Nurse Practitioners) who all work together to provide you with the care you need, when you need it.  We recommend signing up for the patient portal called "MyChart".  Sign up information is provided on this After Visit Summary.  MyChart is used to connect with patients for Virtual Visits (Telemedicine).  Patients are able to view lab/test results, encounter notes, upcoming appointments, etc.  Non-urgent messages can be sent to your provider as well.   To learn more about what you can do with MyChart, go to [email or web address].    Your next appointment:   4 month(s)  The format for your next appointment:   In Person  Provider:   Melina Copa, PA-C   Other Instructions I will have someone call you to schedule an appointment for the blood pressure clinic. It is important that you keep this appointment

## 2019-12-20 NOTE — Telephone Encounter (Signed)
°  Patient Consent for Virtual Visit         [name] has provided verbal consent on 12/20/2019 for a virtual visit (video or telephone).   CONSENT FOR VIRTUAL VISIT FOR:  [name]  By participating in this virtual visit I agree to the following:  I hereby voluntarily request, consent and authorize [facility] and its employed or contracted physicians, Engineer, materials, nurse practitioners or other licensed health care professionals (the Practitioner), to provide me with telemedicine health care services (the Services") as deemed necessary by the treating Practitioner. I acknowledge and consent to receive the Services by the Practitioner via telemedicine. I understand that the telemedicine visit will involve communicating with the Practitioner through live audiovisual communication technology and the disclosure of certain medical information by electronic transmission. I acknowledge that I have been given the opportunity to request an in-person assessment or other available alternative prior to the telemedicine visit and am voluntarily participating in the telemedicine visit.  I understand that I have the right to withhold or withdraw my consent to the use of telemedicine in the course of my care at any time, without affecting my right to future care or treatment, and that the Practitioner or I may terminate the telemedicine visit at any time. I understand that I have the right to inspect all information obtained and/or recorded in the course of the telemedicine visit and may receive copies of available information for a reasonable fee.  I understand that some of the potential risks of receiving the Services via telemedicine include:   Delay or interruption in medical evaluation due to technological equipment failure or disruption;  Information transmitted may not be sufficient (e.g. poor resolution of images) to allow for appropriate medical decision making by the Practitioner;  and/or   In rare instances, security protocols could fail, causing a breach of personal health information.  Furthermore, I acknowledge that it is my responsibility to provide information about my medical history, conditions and care that is complete and accurate to the best of my ability. I acknowledge that Practitioner's advice, recommendations, and/or decision may be based on factors not within their control, such as incomplete or inaccurate data provided by me or distortions of diagnostic images or specimens that may result from electronic transmissions. I understand that the practice of medicine is not an exact science and that Practitioner makes no warranties or guarantees regarding treatment outcomes. I acknowledge that a copy of this consent can be made available to me via my patient portal ([facility]), or I can request a printed copy by calling the office of [facility].    I understand that my insurance will be billed for this visit.   I have read or had this consent read to me.  I understand the contents of this consent, which adequately explains the benefits and risks of the Services being provided via telemedicine.   I have been provided ample opportunity to ask questions regarding this consent and the Services and have had my questions answered to my satisfaction.  I give my informed consent for the services to be provided through the use of telemedicine in my medical care

## 2019-12-26 ENCOUNTER — Other Ambulatory Visit: Payer: Self-pay

## 2019-12-26 ENCOUNTER — Other Ambulatory Visit: Payer: Medicare Other | Admitting: *Deleted

## 2019-12-26 ENCOUNTER — Other Ambulatory Visit: Payer: Self-pay | Admitting: *Deleted

## 2019-12-26 DIAGNOSIS — I5042 Chronic combined systolic (congestive) and diastolic (congestive) heart failure: Secondary | ICD-10-CM | POA: Diagnosis not present

## 2019-12-26 DIAGNOSIS — I10 Essential (primary) hypertension: Secondary | ICD-10-CM | POA: Diagnosis not present

## 2019-12-26 DIAGNOSIS — I428 Other cardiomyopathies: Secondary | ICD-10-CM

## 2019-12-26 DIAGNOSIS — N1831 Chronic kidney disease, stage 3a: Secondary | ICD-10-CM

## 2019-12-26 DIAGNOSIS — I272 Pulmonary hypertension, unspecified: Secondary | ICD-10-CM | POA: Diagnosis not present

## 2019-12-27 LAB — BASIC METABOLIC PANEL
BUN/Creatinine Ratio: 14 (ref 12–28)
BUN: 16 mg/dL (ref 8–27)
CO2: 22 mmol/L (ref 20–29)
Calcium: 9 mg/dL (ref 8.7–10.3)
Chloride: 100 mmol/L (ref 96–106)
Creatinine, Ser: 1.18 mg/dL — ABNORMAL HIGH (ref 0.57–1.00)
GFR calc Af Amer: 55 mL/min/{1.73_m2} — ABNORMAL LOW (ref >59)
GFR calc non Af Amer: 48 mL/min/{1.73_m2} — ABNORMAL LOW (ref >59)
Glucose: 88 mg/dL (ref 65–99)
Potassium: 4.8 mmol/L (ref 3.5–5.2)
Sodium: 132 mmol/L — ABNORMAL LOW (ref 134–144)

## 2019-12-30 ENCOUNTER — Ambulatory Visit: Payer: Medicare Other

## 2019-12-31 ENCOUNTER — Telehealth (INDEPENDENT_AMBULATORY_CARE_PROVIDER_SITE_OTHER): Payer: Medicare Other | Admitting: Family Medicine

## 2019-12-31 ENCOUNTER — Encounter: Payer: Self-pay | Admitting: Family Medicine

## 2019-12-31 VITALS — BP 147/74 | HR 80 | Temp 99.8°F

## 2019-12-31 DIAGNOSIS — Z7185 Encounter for immunization safety counseling: Secondary | ICD-10-CM

## 2019-12-31 DIAGNOSIS — J329 Chronic sinusitis, unspecified: Secondary | ICD-10-CM | POA: Diagnosis not present

## 2019-12-31 DIAGNOSIS — B9789 Other viral agents as the cause of diseases classified elsewhere: Secondary | ICD-10-CM

## 2019-12-31 MED ORDER — FLUTICASONE PROPIONATE 50 MCG/ACT NA SUSP: 2.0000 | Freq: Every day | NASAL | 0 refills | Status: DC

## 2019-12-31 MED ORDER — BENZONATATE 100 MG PO CAPS: 200.0000 mg | ORAL_CAPSULE | Freq: Two times a day (BID) | ORAL | 0 refills | Status: DC

## 2019-12-31 MED ORDER — DM-GUAIFENESIN ER 30-600 MG PO TB12: 1.0000 | ORAL_TABLET | Freq: Two times a day (BID) | ORAL | 0 refills | Status: DC

## 2019-12-31 NOTE — Patient Instructions (Signed)

## 2019-12-31 NOTE — Progress Notes (Signed)
VIRTUAL VISIT VIA VIDEO  I connected with [name] on 12/31/19 at  9:20 AM EST by elemedicine application and verified that I am speaking with the correct person using two identifiers. Location patient: Home Location provider: [facility], Office Persons participating in the virtual visit: Patient, Dr. Raoul Pitch and Samul Dada, CMA  I discussed the limitations of evaluation and management by telemedicine and the availability of in person appointments. The patient expressed understanding and agreed to proceed.   SUBJECTIVE Chief Complaint  Patient presents with  . Cough    Pt states that she been taking care of her grandson that had an ear infection; pt c/o HA, nausea when coughing, productive cough with greenish tint, runny nose x 1 day    HPI: [name] is a 69 y.o. female present to after-hours clinic for acute concern.  She reports she has been taking care of her grandson over the last week who had a non-Covid viral upper respiratory infection and ear infection.  She states he has fully recovered and never required antibiotics.  However yesterday she started to have symptoms of cough that is mildly productive with greenish tinted phlegm, runny nose, headache and occasional mild nausea.  She denies fever, chills, vomit, rash or bowel habit changes.  She has had her Covid series and was scheduled for her booster immunization tomorrow.  ROS: See pertinent positives and negatives per HPI.  Patient Active Problem List   Diagnosis Date Noted  . Hypertensive heart disease with heart failure ([place])   . AKI (acute kidney injury) ([place])   . Acute on chronic respiratory failure with hypoxia ([place]) 12/25/2018  . Acute heart failure ([place])   . Acute renal failure with acute cortical necrosis ([facility])   . Acute respiratory failure with hypoxia ([place])   . Depression, recurrent ([place]) 03/04/2016  . 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 NECK PAIN, CHRONIC 07/15/2007  . Essential hypertension  09/03/2006  . ALLERGIC RHINITIS 09/03/2006    Social History   Tobacco Use  . Smoking status: Former Smoker    Packs/day: 1.00    Years: 7.00    Pack years: 7.00    Types: Cigarettes    Quit date: 12/05/2018    Years since quitting: 1.0  . Smokeless tobacco: Never Used  Substance Use Topics  . Alcohol use: Yes    Alcohol/week: 14.0 standard drinks    Types: 14 Cans of beer per week    Current Outpatient Medications:  .  albuterol (VENTOLIN HFA) 108 (90 Base) MCG/ACT inhaler, Inhale 2 puffs into the lungs every 6 (six) hours as needed for wheezing or shortness of breath., Disp: 6.7 g, Rfl: 2 .  atorvastatin (LIPITOR) 40 MG tablet, TAKE 1 TABLET (40 MG TOTAL) BY MOUTH DAILY AT 6 PM., Disp: 90 tablet, Rfl: 1 .  carvedilol (COREG) 12.5 MG tablet, TAKE 1 TABLET (12.5 MG TOTAL) BY MOUTH 2 (TWO) TIMES DAILY WITH A MEAL., Disp: 180 tablet, Rfl: 1 .  diphenoxylate-atropine (LOMOTIL) 2.5-0.025 MG tablet, Take 1 tablet by mouth 2 (two) times daily as needed for diarrhea or loose stools., Disp: 60 tablet, Rfl: 2 .  escitalopram (LEXAPRO) 10 MG tablet, 1-1/2 TABLETS DAILY AT BEDTIME, Disp: 135 tablet, Rfl: 1 .  furosemide (LASIX) 20 MG tablet, TAKE 1 TABLET BY MOUTH EVERY DAY, Disp: 90 tablet, Rfl: 1 .  hyoscyamine (LEVSIN SL) 0.125 MG SL tablet, PLACE 1 TABLET (0.125 MG TOTAL) UNDER THE TONGUE AS NEEDED. AS NEEDED FOR SPASM, Disp: 135 tablet,  Rfl: 1 .  pantoprazole (PROTONIX) 20 MG tablet, TAKE 1 TABLET BY MOUTH EVERY DAY, Disp: 90 tablet, Rfl: 1 .  spironolactone (ALDACTONE) 25 MG tablet, Take 1 tablet (25 mg total) by mouth daily., Disp: 90 tablet, Rfl: 1 .  valsartan (DIOVAN) 320 MG tablet, Take 1 tablet (320 mg total) by mouth daily., Disp: 30 tablet, Rfl: 11 .  benzonatate (TESSALON PERLES) 100 MG capsule, Take 2 capsules (200 mg total) by mouth 2 (two) times daily., Disp: 20 capsule, Rfl: 0 .  dextromethorphan-guaiFENesin (MUCINEX DM) 30-600 MG 12hr tablet, Take 1 tablet by mouth 2 (two)  times daily., Disp: 30 tablet, Rfl: 0 .  fluticasone (FLONASE) 50 MCG/ACT nasal spray, Place 2 sprays into both nostrils daily., Disp: 16 g, Rfl: 0  Allergies  Allergen Reactions  . Morphine And Related Other (See Comments)    Headache     OBJECTIVE: BP (!) 147/74   Pulse 80   Temp 99.8 F (37.7 C) (Oral)  Gen: No acute distress. Nontoxic in appearance. Runny nose present HENT: AT. [place].  MMM.  Eyes:Pupils Equal Round Reactive to light, Extraocular movements intact,  Conjunctiva without redness, discharge or icterus. CV: no edema Chest: Cough present, no shortness of breath Skin: no rashes, purpura or petechiae.  Neuro:  Alert. Oriented x3    ASSESSMENT AND PLAN: [name] is a 69 y.o. female present for  Viral sinusitis Cannot rule out Covid infection.  However, it is less likely considering she has had her [place] series (although due for booster) and she was caring for her grandson who had a non-Covid viral illness. Rest, hydrate.  prescribed flonase, mucinex (DM if cough) and tessalon perles.  Seek urgent/emergent treatment if symptoms are worsening F/U 1-2 weeks if not improved., sooner if worsening.  Vaccine counseling  - encouraged pt to reschedule her COVID booster in 1-2 weeks after current infection is resolved.    Howard Pouch, DO 12/31/2019   Return in about 1 week (around 01/07/2020), or if symptoms worsen or fail to improve, for with pcp.  No orders of the defined types were placed in this encounter.  Meds ordered this encounter  Medications  . dextromethorphan-guaiFENesin (MUCINEX DM) 30-600 MG 12hr tablet    Sig: Take 1 tablet by mouth 2 (two) times daily.    Dispense:  30 tablet    Refill:  0  . fluticasone (FLONASE) 50 MCG/ACT nasal spray    Sig: Place 2 sprays into both nostrils daily.    Dispense:  16 g    Refill:  0  . benzonatate (TESSALON PERLES) 100 MG capsule    Sig: Take 2 capsules (200 mg total) by mouth 2 (two) times daily.     Dispense:  20 capsule    Refill:  0   Referral Orders  No referral(s) requested today

## 2020-01-05 ENCOUNTER — Other Ambulatory Visit: Payer: Self-pay

## 2020-01-05 ENCOUNTER — Telehealth (INDEPENDENT_AMBULATORY_CARE_PROVIDER_SITE_OTHER): Payer: Medicare Other | Admitting: Internal Medicine

## 2020-01-05 DIAGNOSIS — B9689 Other specified bacterial agents as the cause of diseases classified elsewhere: Secondary | ICD-10-CM

## 2020-01-05 DIAGNOSIS — R059 Cough, unspecified: Secondary | ICD-10-CM

## 2020-01-05 DIAGNOSIS — J019 Acute sinusitis, unspecified: Secondary | ICD-10-CM

## 2020-01-05 MED ORDER — AZITHROMYCIN 250 MG PO TABS: ORAL_TABLET | ORAL | 0 refills | Status: DC

## 2020-01-05 MED ORDER — HYDROCOD POLST-CPM POLST ER 10-8 MG/5ML PO SUER: 5.0000 mL | Freq: Every evening | ORAL | 0 refills | Status: DC | PRN

## 2020-01-05 NOTE — Progress Notes (Signed)
Virtual Visit via Telephone Note  I connected with [name] on 01/05/20 at 10:00 AM EST by telephone and verified that I am speaking with the correct person using two identifiers.   I discussed the limitations, risks, security and privacy concerns of performing an evaluation and management service by telephone and the availability of in person appointments. I also discussed with the patient that there may be a patient responsible charge related to this service. The patient expressed understanding and agreed to proceed.  Location patient: home Location provider: work office Participants present for the call: patient, provider Patient did not have a visit in the prior 7 days to address this/these issue(s).   History of Present Illness:  Patient has been having what she believes is a head cold for the past 8 days.  Last week she has been taking care of her 28-year-old grandson who was sick with a cold and ear infection.  Few days after she started developing a cough that was initially nonproductive with a runny nose.  She had a virtual visit on 12/11 and was prescribed Flonase and Tessalon Perles.  She has been taking Tessalon twice a day without any relief.  She states that now the cough is productive of greenish sputum, she also has green nasal secretions, the Flonase helps some.  The cough is significant enough to where she feels like she needs to "throw up" when she starts coughing.  She has been having an intermittent headache, no vision changes, she denies shortness of breath, dyspnea on exertion, chest pain, fever, sore throat or ear pain.  She has had 2 Covid vaccines, has not yet had her booster.  On Tuesday, 2 days ago, she went to [facility] and had a Covid PCR that has resulted negative.   Observations/Objective: Patient sounds cheerful and well on the phone. I do not appreciate any increased work of breathing. Speech and thought processing are grossly intact. Patient reported  vitals: None reported   Current Outpatient Medications:  .  albuterol (VENTOLIN HFA) 108 (90 Base) MCG/ACT inhaler, Inhale 2 puffs into the lungs every 6 (six) hours as needed for wheezing or shortness of breath., Disp: 6.7 g, Rfl: 2 .  atorvastatin (LIPITOR) 40 MG tablet, TAKE 1 TABLET (40 MG TOTAL) BY MOUTH DAILY AT 6 PM., Disp: 90 tablet, Rfl: 1 .  carvedilol (COREG) 12.5 MG tablet, TAKE 1 TABLET (12.5 MG TOTAL) BY MOUTH 2 (TWO) TIMES DAILY WITH A MEAL., Disp: 180 tablet, Rfl: 1 .  diphenoxylate-atropine (LOMOTIL) 2.5-0.025 MG tablet, Take 1 tablet by mouth 2 (two) times daily as needed for diarrhea or loose stools., Disp: 60 tablet, Rfl: 2 .  escitalopram (LEXAPRO) 10 MG tablet, 1-1/2 TABLETS DAILY AT BEDTIME, Disp: 135 tablet, Rfl: 1 .  fluticasone (FLONASE) 50 MCG/ACT nasal spray, Place 2 sprays into both nostrils daily., Disp: 16 g, Rfl: 0 .  furosemide (LASIX) 20 MG tablet, TAKE 1 TABLET BY MOUTH EVERY DAY, Disp: 90 tablet, Rfl: 1 .  hyoscyamine (LEVSIN SL) 0.125 MG SL tablet, PLACE 1 TABLET (0.125 MG TOTAL) UNDER THE TONGUE AS NEEDED. AS NEEDED FOR SPASM, Disp: 135 tablet, Rfl: 1 .  pantoprazole (PROTONIX) 20 MG tablet, TAKE 1 TABLET BY MOUTH EVERY DAY, Disp: 90 tablet, Rfl: 1 .  spironolactone (ALDACTONE) 25 MG tablet, Take 1 tablet (25 mg total) by mouth daily., Disp: 90 tablet, Rfl: 1 .  valsartan (DIOVAN) 320 MG tablet, Take 1 tablet (320 mg total) by mouth daily., Disp: 30  tablet, Rfl: 11 .  azithromycin (ZITHROMAX) 250 MG tablet, Take as directed, Disp: 6 tablet, Rfl: 0 .  chlorpheniramine-HYDROcodone (TUSSIONEX PENNKINETIC ER) 10-8 MG/5ML SUER, Take 5 mLs by mouth at bedtime as needed for cough., Disp: 35 mL, Rfl: 0  Review of Systems:  Constitutional: Denies fever, chills, diaphoresis, appetite change and fatigue.  HEENT: Denies photophobia, eye pain, redness, hearing loss, ear pain, sore throat, , sneezing, mouth sores, trouble swallowing, neck pain, neck stiffness and  tinnitus.   Respiratory: Denies SOB, DOE,  chest tightness,  and wheezing.   Cardiovascular: Denies chest pain, palpitations and leg swelling.  Gastrointestinal: Denies nausea, vomiting, abdominal pain, diarrhea, constipation, blood in stool and abdominal distention.  Genitourinary: Denies dysuria, urgency, frequency, hematuria, flank pain and difficulty urinating.  Endocrine: Denies: hot or cold intolerance, sweats, changes in hair or nails, polyuria, polydipsia. Musculoskeletal: Denies myalgias, back pain, joint swelling, arthralgias and gait problem.  Skin: Denies pallor, rash and wound.  Neurological: Denies dizziness, seizures, syncope, weakness, light-headedness, numbness and headaches.  Hematological: Denies adenopathy. Easy bruising, personal or family bleeding history  Psychiatric/Behavioral: Denies suicidal ideation, mood changes, confusion, nervousness, sleep disturbance and agitation   Assessment and Plan:  Acute bacterial sinusitis  Cough   -Given that her secretions are now green, and the time course of around 10 days I believe it is reasonable to treat her with antibiotics.  I will prescribe her a Z-Pak.  The most bothersome for her is the cough.  Tessalon Perles are not helping, she has also tried over-the-counter cough syrups.  I will give her some Tussionex to take at bedtime, she has been advised that it may make her drowsy/somnolent. -Negative Covid PCR is reassuring, she will reschedule her Covid booster when she recovers. -She has been advised that if she does not improve over the next 10 days or so to contact the office for an in person appointment.    I discussed the assessment and treatment plan with the patient. The patient was provided an opportunity to ask questions and all were answered. The patient agreed with the plan and demonstrated an understanding of the instructions.   The patient was advised to call back or seek an in-person evaluation if the symptoms  worsen or if the condition fails to improve as anticipated.  I provided 14 minutes of non-face-to-face time during this encounter.   Lelon Frohlich, MD [facility]

## 2020-01-23 ENCOUNTER — Other Ambulatory Visit: Payer: Self-pay | Admitting: Internal Medicine

## 2020-01-23 ENCOUNTER — Other Ambulatory Visit: Payer: Self-pay | Admitting: Family Medicine

## 2020-01-23 DIAGNOSIS — R1084 Generalized abdominal pain: Secondary | ICD-10-CM

## 2020-01-23 DIAGNOSIS — F339 Major depressive disorder, recurrent, unspecified: Secondary | ICD-10-CM

## 2020-01-23 DIAGNOSIS — I5042 Chronic combined systolic (congestive) and diastolic (congestive) heart failure: Secondary | ICD-10-CM

## 2020-01-23 DIAGNOSIS — K580 Irritable bowel syndrome with diarrhea: Secondary | ICD-10-CM

## 2020-01-25 ENCOUNTER — Other Ambulatory Visit: Payer: Self-pay

## 2020-01-25 ENCOUNTER — Encounter: Payer: Self-pay | Admitting: Cardiovascular Disease

## 2020-01-25 ENCOUNTER — Ambulatory Visit (INDEPENDENT_AMBULATORY_CARE_PROVIDER_SITE_OTHER): Payer: Medicare Other | Admitting: Cardiovascular Disease

## 2020-01-25 VITALS — BP 146/88 | HR 68 | Ht 59.0 in | Wt 144.2 lb

## 2020-01-25 DIAGNOSIS — I5042 Chronic combined systolic (congestive) and diastolic (congestive) heart failure: Secondary | ICD-10-CM | POA: Diagnosis not present

## 2020-01-25 DIAGNOSIS — I10 Essential (primary) hypertension: Secondary | ICD-10-CM

## 2020-01-25 HISTORY — DX: Chronic combined systolic (congestive) and diastolic (congestive) heart failure: I50.42

## 2020-01-25 MED ORDER — FUROSEMIDE 20 MG PO TABS: 20.0000 mg | ORAL_TABLET | Freq: Every day | ORAL | 1 refills | Status: DC

## 2020-01-25 MED ORDER — CARVEDILOL 25 MG PO TABS: 25.0000 mg | ORAL_TABLET | Freq: Two times a day (BID) | ORAL | 3 refills | Status: DC

## 2020-01-25 MED ORDER — HYDRALAZINE HCL 50 MG PO TABS: 50.0000 mg | ORAL_TABLET | Freq: Two times a day (BID) | ORAL | 3 refills | Status: DC

## 2020-01-25 NOTE — Patient Instructions (Signed)
Medication Instructions:  START HYDRALAZINE 50 MG TWICE A DAY   FUROSEMIDE DAILY REFILL HAS BEEN SENT TO PHARMACY   Labwork: FASTING LABS ON A Monday MORNING BEFORE YOU HAVE YOUR MORNING MEDICATIONS Saturday AND Sunday PRIOR TO LABS HOLD YOUR SPIRONOLACTONE AND VALSARTAN   Testing/Procedures: NONE   Follow-Up: 02/27/2020 AT 9:45 AM WITH DR [facility]   If you need a refill on your cardiac medications before your next appointment, please call your pharmacy.
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
## 2020-02-01 ENCOUNTER — Ambulatory Visit: Payer: Medicare Other | Admitting: Cardiology

## 2020-02-27 ENCOUNTER — Ambulatory Visit: Payer: Medicare Other | Admitting: Cardiovascular Disease

## 2020-04-06 ENCOUNTER — Other Ambulatory Visit: Payer: Self-pay

## 2020-04-06 ENCOUNTER — Ambulatory Visit (INDEPENDENT_AMBULATORY_CARE_PROVIDER_SITE_OTHER): Payer: Medicare Other | Admitting: Cardiovascular Disease

## 2020-04-06 ENCOUNTER — Encounter: Payer: Self-pay | Admitting: Cardiovascular Disease

## 2020-04-06 VITALS — BP 134/76 | HR 65 | Ht 59.0 in | Wt 151.2 lb

## 2020-04-06 DIAGNOSIS — I10 Essential (primary) hypertension: Secondary | ICD-10-CM

## 2020-04-06 DIAGNOSIS — I5042 Chronic combined systolic (congestive) and diastolic (congestive) heart failure: Secondary | ICD-10-CM | POA: Diagnosis not present

## 2020-04-06 MED ORDER — CARVEDILOL 12.5 MG PO TABS: 12.5000 mg | ORAL_TABLET | Freq: Two times a day (BID) | ORAL | 3 refills | Status: DC

## 2020-04-06 MED ORDER — HYDRALAZINE HCL 100 MG PO TABS: 100.0000 mg | ORAL_TABLET | Freq: Two times a day (BID) | ORAL | 3 refills | Status: DC
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
## 2020-04-06 NOTE — Patient Instructions (Addendum)
Medication Instructions:  DECREASE CARVEDILOL TO 12.5 MG TWICE A DAY   INCREASE YOUR HYDRALAZINE TO 100 MG TWICE A DAY   Labwork: NONE  Testing/Procedures: NONE  Follow-Up: 06/04/2020 AT 11:30 AM WITH PHARM D AT [facility] OFFICE   10/08/2020 AT 10:00 AM WITH DR Kokhanok AT [facility] OFFICE   If you need a refill on your cardiac medications before your next appointment, please call your pharmacy.

## 2020-04-19 ENCOUNTER — Encounter: Payer: Self-pay | Admitting: Cardiology

## 2020-04-19 ENCOUNTER — Other Ambulatory Visit: Payer: Self-pay | Admitting: Internal Medicine

## 2020-04-19 DIAGNOSIS — R1084 Generalized abdominal pain: Secondary | ICD-10-CM

## 2020-05-25 ENCOUNTER — Other Ambulatory Visit: Payer: Self-pay | Admitting: Cardiovascular Disease

## 2020-05-25 DIAGNOSIS — I110 Hypertensive heart disease with heart failure: Secondary | ICD-10-CM

## 2020-06-04 ENCOUNTER — Ambulatory Visit: Payer: Medicare Other
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
## 2020-06-11 ENCOUNTER — Ambulatory Visit: Payer: Medicare Other | Admitting: Cardiology
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
## 2020-06-19 ENCOUNTER — Ambulatory Visit: Payer: Medicare Other | Admitting: Cardiology

## 2020-07-02 ENCOUNTER — Encounter: Payer: Medicare Other | Admitting: Pharmacist Clinician (PhC)/ Clinical Pharmacy Specialist
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
## 2020-07-21 ENCOUNTER — Other Ambulatory Visit: Payer: Self-pay | Admitting: Internal Medicine

## 2020-07-21 DIAGNOSIS — F339 Major depressive disorder, recurrent, unspecified: Secondary | ICD-10-CM

## 2020-07-21 DIAGNOSIS — R1084 Generalized abdominal pain: Secondary | ICD-10-CM

## 2020-07-21 DIAGNOSIS — I5042 Chronic combined systolic (congestive) and diastolic (congestive) heart failure: Secondary | ICD-10-CM

## 2020-07-21 DIAGNOSIS — K580 Irritable bowel syndrome with diarrhea: Secondary | ICD-10-CM

## 2020-08-06 ENCOUNTER — Other Ambulatory Visit: Payer: Self-pay | Admitting: Cardiovascular Disease

## 2020-10-08 ENCOUNTER — Ambulatory Visit (HOSPITAL_BASED_OUTPATIENT_CLINIC_OR_DEPARTMENT_OTHER): Payer: Medicare Other | Admitting: Cardiovascular Disease

## 2020-10-23 ENCOUNTER — Other Ambulatory Visit: Payer: Self-pay | Admitting: Cardiovascular Disease

## 2020-10-23 DIAGNOSIS — I5042 Chronic combined systolic (congestive) and diastolic (congestive) heart failure: Secondary | ICD-10-CM

## 2020-10-23 NOTE — Telephone Encounter (Signed)
Rx(s) sent to pharmacy electronically.  

## 2020-10-25 ENCOUNTER — Other Ambulatory Visit: Payer: Self-pay | Admitting: Internal Medicine

## 2020-10-25 ENCOUNTER — Telehealth: Payer: Self-pay | Admitting: Internal Medicine

## 2020-10-25 DIAGNOSIS — F339 Major depressive disorder, recurrent, unspecified: Secondary | ICD-10-CM

## 2020-10-25 MED ORDER — ESCITALOPRAM OXALATE 10 MG PO TABS: ORAL_TABLET | ORAL | 0 refills | Status: DC

## 2020-10-25 NOTE — Telephone Encounter (Signed)
Pt call and need a refill on escitalopram (LEXAPRO) 10 MG tablet  sent to  [id number] - [place], [place] Phone:  [phone]  Fax:  [phone]

## 2020-10-30 ENCOUNTER — Encounter: Payer: Medicare Other | Admitting: Internal Medicine

## 2020-11-14 ENCOUNTER — Other Ambulatory Visit: Payer: Self-pay

## 2020-11-14 ENCOUNTER — Ambulatory Visit (INDEPENDENT_AMBULATORY_CARE_PROVIDER_SITE_OTHER): Payer: Medicare Other | Admitting: Internal Medicine

## 2020-11-14 ENCOUNTER — Encounter: Payer: Self-pay | Admitting: Internal Medicine

## 2020-11-14 VITALS — BP 136/70 | HR 71 | Temp 97.8°F | Ht 60.0 in | Wt 141.2 lb

## 2020-11-14 DIAGNOSIS — I700 Atherosclerosis of aorta: Secondary | ICD-10-CM | POA: Diagnosis not present

## 2020-11-14 DIAGNOSIS — F339 Major depressive disorder, recurrent, unspecified: Secondary | ICD-10-CM | POA: Diagnosis not present

## 2020-11-14 DIAGNOSIS — I5042 Chronic combined systolic (congestive) and diastolic (congestive) heart failure: Secondary | ICD-10-CM

## 2020-11-14 DIAGNOSIS — Z23 Encounter for immunization: Secondary | ICD-10-CM | POA: Diagnosis not present

## 2020-11-14 DIAGNOSIS — G4700 Insomnia, unspecified: Secondary | ICD-10-CM | POA: Diagnosis not present

## 2020-11-14 DIAGNOSIS — Z1231 Encounter for screening mammogram for malignant neoplasm of breast: Secondary | ICD-10-CM

## 2020-11-14 DIAGNOSIS — E2839 Other primary ovarian failure: Secondary | ICD-10-CM

## 2020-11-14 DIAGNOSIS — Z0000 Encounter for general adult medical examination without abnormal findings: Secondary | ICD-10-CM

## 2020-11-14 DIAGNOSIS — I10 Essential (primary) hypertension: Secondary | ICD-10-CM | POA: Diagnosis not present

## 2020-11-14 LAB — CBC WITH DIFFERENTIAL/PLATELET
Basophils Absolute: 0 10*3/uL (ref 0.0–0.1)
Basophils Relative: 0.4 % (ref 0.0–3.0)
Eosinophils Absolute: 0.2 10*3/uL (ref 0.0–0.7)
Eosinophils Relative: 2.2 % (ref 0.0–5.0)
HCT: 32.2 % — ABNORMAL LOW (ref 36.0–46.0)
Hemoglobin: 10.8 g/dL — ABNORMAL LOW (ref 12.0–15.0)
Lymphocytes Relative: 24.6 % (ref 12.0–46.0)
Lymphs Abs: 1.8 10*3/uL (ref 0.7–4.0)
MCHC: 33.5 g/dL (ref 30.0–36.0)
MCV: 99.1 fl (ref 78.0–100.0)
Monocytes Absolute: 0.7 10*3/uL (ref 0.1–1.0)
Monocytes Relative: 9.8 % (ref 3.0–12.0)
Neutro Abs: 4.7 10*3/uL (ref 1.4–7.7)
Neutrophils Relative %: 63 % (ref 43.0–77.0)
Platelets: 272 10*3/uL (ref 150.0–400.0)
RBC: 3.25 Mil/uL — ABNORMAL LOW (ref 3.87–5.11)
RDW: 14.2 % (ref 11.5–15.5)
WBC: 7.4 10*3/uL (ref 4.0–10.5)

## 2020-11-14 LAB — COMPREHENSIVE METABOLIC PANEL
ALT: 14 U/L (ref 0–35)
AST: 20 U/L (ref 0–37)
Albumin: 4.1 g/dL (ref 3.5–5.2)
Alkaline Phosphatase: 57 U/L (ref 39–117)
BUN: 11 mg/dL (ref 6–23)
CO2: 23 mEq/L (ref 19–32)
Calcium: 8.9 mg/dL (ref 8.4–10.5)
Chloride: 108 mEq/L (ref 96–112)
Creatinine, Ser: 1.32 mg/dL — ABNORMAL HIGH (ref 0.40–1.20)
GFR: 41.34 mL/min — ABNORMAL LOW (ref >60.00)
Glucose, Bld: 88 mg/dL (ref 70–99)
Potassium: 4.4 mEq/L (ref 3.5–5.1)
Sodium: 136 mEq/L (ref 135–145)
Total Bilirubin: 0.4 mg/dL (ref 0.2–1.2)
Total Protein: 6.9 g/dL (ref 6.0–8.3)

## 2020-11-14 LAB — LIPID PANEL
Cholesterol: 103 mg/dL (ref 0–200)
HDL: 49.5 mg/dL (ref >39.00)
LDL Cholesterol: 39 mg/dL (ref 0–99)
NonHDL: 53.88
Total CHOL/HDL Ratio: 2
Triglycerides: 74 mg/dL (ref 0.0–149.0)
VLDL: 14.8 mg/dL (ref 0.0–40.0)

## 2020-11-14 MED ORDER — TRAZODONE HCL 50 MG PO TABS: 25.0000 mg | ORAL_TABLET | Freq: Every evening | ORAL | 1 refills | Status: DC | PRN
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
## 2020-11-14 NOTE — Addendum Note (Signed)
Addended by: Amanda Cockayne on: 11/14/2020 10:03 AM   Modules accepted: Orders

## 2020-11-14 NOTE — Addendum Note (Signed)
Addended by: Nilda Riggs on: 11/14/2020 11:16 AM   Modules accepted: Orders

## 2020-11-14 NOTE — Patient Instructions (Addendum)
-  Nice seeing you today!!  -Lab work today; will notify you once results are available.  -Flu and pneumonia vaccines today.  -Make sure you get tetanus, shingles and COVID booster at pharmacy.  -Schedule follow up in 6 months.  -Start trazodone 50 mg at bedtime for sleep.

## 2020-11-15 ENCOUNTER — Encounter: Payer: Self-pay | Admitting: Internal Medicine

## 2020-11-15 DIAGNOSIS — N183 Chronic kidney disease, stage 3 unspecified: Secondary | ICD-10-CM | POA: Insufficient documentation

## 2021-01-24 ENCOUNTER — Other Ambulatory Visit: Payer: Self-pay | Admitting: Internal Medicine

## 2021-01-24 DIAGNOSIS — K580 Irritable bowel syndrome with diarrhea: Secondary | ICD-10-CM

## 2021-01-24 DIAGNOSIS — F339 Major depressive disorder, recurrent, unspecified: Secondary | ICD-10-CM

## 2021-01-24 DIAGNOSIS — R1084 Generalized abdominal pain: Secondary | ICD-10-CM

## 2021-01-24 DIAGNOSIS — I5042 Chronic combined systolic (congestive) and diastolic (congestive) heart failure: Secondary | ICD-10-CM

## 2021-01-25 ENCOUNTER — Other Ambulatory Visit: Payer: Self-pay | Admitting: Cardiovascular Disease

## 2021-04-24 ENCOUNTER — Telehealth: Payer: Self-pay | Admitting: Internal Medicine

## 2021-04-24 ENCOUNTER — Encounter: Payer: Self-pay | Admitting: Family Medicine

## 2021-04-24 ENCOUNTER — Telehealth (INDEPENDENT_AMBULATORY_CARE_PROVIDER_SITE_OTHER): Payer: Medicare Other | Admitting: Family Medicine

## 2021-04-24 DIAGNOSIS — I110 Hypertensive heart disease with heart failure: Secondary | ICD-10-CM

## 2021-04-24 DIAGNOSIS — J440 Chronic obstructive pulmonary disease with acute lower respiratory infection: Secondary | ICD-10-CM

## 2021-04-24 DIAGNOSIS — J209 Acute bronchitis, unspecified: Secondary | ICD-10-CM

## 2021-04-24 DIAGNOSIS — I5043 Acute on chronic combined systolic (congestive) and diastolic (congestive) heart failure: Secondary | ICD-10-CM | POA: Diagnosis not present

## 2021-04-24 DIAGNOSIS — I10 Essential (primary) hypertension: Secondary | ICD-10-CM

## 2021-04-24 MED ORDER — BENZONATATE 200 MG PO CAPS: 200.0000 mg | ORAL_CAPSULE | Freq: Four times a day (QID) | ORAL | 0 refills | Status: DC | PRN

## 2021-04-24 MED ORDER — AZITHROMYCIN 250 MG PO TABS: ORAL_TABLET | ORAL | 0 refills | Status: DC

## 2021-04-24 MED ORDER — ALBUTEROL SULFATE HFA 108 (90 BASE) MCG/ACT IN AERS: 2.0000 | INHALATION_SPRAY | RESPIRATORY_TRACT | 0 refills | Status: DC | PRN

## 2021-04-24 NOTE — Telephone Encounter (Signed)
Patient called because pharmacy has told her that her insurance will not cover benzonatate (TESSALON) 200 MG capsule and she will need an alternative sent in. ? ? ?Please send to ?[id number] [place] [place]:  [phone] ?Fax:  [phone] ?  ? ? ? ? ? ? ? ? ?Please advise  ?

## 2021-04-24 NOTE — Progress Notes (Signed)
? ?  Subjective:  ? ? Patient ID: [name], female    DOB: [birth year], 71 y.o.   MRN: [id number] ? ?HPI ?Virtual Visit via Telephone Note ? ?I connected with the patient on 04/24/21 at  2:00 PM EDT by telephone and verified that I am speaking with the correct person using two identifiers. ?  ?I discussed the limitations, risks, security and privacy concerns of performing an evaluation and management service by telephone and the availability of in person appointments. I also discussed with the patient that there may be a patient responsible charge related to this service. The patient expressed understanding and agreed to proceed. ? ?Location patient: home ?Location provider: work or home office ?Participants present for the call: patient, provider ?Patient did not have a visit in the prior 7 days to address this/these issue(s). ? ? ?History of Present Illness: ?Here for 3 days of chest tightness and a dry cough. No body aches or NVD. She gets mildly SOB, but she ran out of her inhaler. No fever. She is taking Mucinex. ?  ?Observations/Objective: ?Patient sounds cheerful and well on the phone. ?I do not appreciate any SOB. ?Speech and thought processing are grossly intact. ?Patient reported vitals: ? ?Assessment and Plan: ?Bronchitis. We will treat this with a Zpack. Add Benzonatate as needed for the cough. We will refill her albuterol inhaler to use as needed.  ?Alysia Penna, MD ? ? ?Follow Up Instructions: ? ? ? ? ?[id number] 5-10 ?[id number] 11-20 ?9443 21-30 ?I did not refer this patient for an OV in the next 24 hours for this/these issue(s). ? ?I discussed the assessment and treatment plan with the patient. The patient was provided an opportunity to ask questions and all were answered. The patient agreed with the plan and demonstrated an understanding of the instructions. ?  ?The patient was advised to call back or seek an in-person evaluation if the symptoms worsen or if the condition fails to improve as  anticipated. ? ?I provided 13 minutes of non-face-to-face time during this encounter. ? ? ?Alysia Penna, MD   ? ? ?Review of Systems ? ?   ?Objective:  ? Physical Exam ? ? ? ? ?   ?Assessment & Plan:  ? ? ?

## 2021-04-24 NOTE — Telephone Encounter (Signed)
Patient calling in with respiratory symptoms: ?Shortness of breath, chest pain, palpitations or other red words send to Triage ? ?Does the patient have a fever over 100, cough, congestion, sore throat, runny nose, lost of taste/smell (please list symptoms that patient has)?cough,ha ? ?What date did symptoms start?04-22-2021 ?(If over 5 days ago, pt may be scheduled for in person visit) ? ?Have you tested for Covid in the last 5 days? Yes  ? ?If yes, was it positive '[]'$  OR negative '[x]'$ ? If positive in the last 5 days, please schedule virtual visit now. If negative, schedule for an in person OV with the next available provider if PCP has no openings. Please also let patient know they will be tested again (follow the script below) ? ?"you will have to arrive 47mns prior to your appt time to be Covid tested. Please park in back of office at the cone & call [phone] let the staff know you have arrived. A staff member will meet you at your car to do a rapid covid test. Once the test has resulted you will be notified by phone of your results to determine if appt will remain an in person visit or be converted to a virtual/phone visit. If you arrive less than 372ms before your appt time, your visit will be automatically converted to virtual & any recommended testing will happen AFTER the visit." ?Pt has virtual with dr frSarajane Jews-05-2021 2 pm ? ?If no availability for virtual visit in office,  please schedule another  office ? ?If no [facility] another [facility], please instruct patient that they can schedule an evisit or virtual visit through their mychart account. Visits up to 8pm ? ?patients can be seen in office 5 days after positive COVID test ? ?  ?

## 2021-04-25 ENCOUNTER — Other Ambulatory Visit: Payer: Self-pay | Admitting: Cardiovascular Disease

## 2021-04-25 DIAGNOSIS — I5042 Chronic combined systolic (congestive) and diastolic (congestive) heart failure: Secondary | ICD-10-CM

## 2021-04-25 NOTE — Telephone Encounter (Signed)
Tell her to use Delsym OTC for the cough  ?

## 2021-04-25 NOTE — Telephone Encounter (Signed)
Rx(s) sent to pharmacy electronically.  

## 2021-04-25 NOTE — Telephone Encounter (Signed)
Patient informed of the message below.

## 2021-05-17 ENCOUNTER — Other Ambulatory Visit: Payer: Self-pay | Admitting: Family Medicine

## 2021-05-17 DIAGNOSIS — I110 Hypertensive heart disease with heart failure: Secondary | ICD-10-CM

## 2021-05-17 DIAGNOSIS — I5043 Acute on chronic combined systolic (congestive) and diastolic (congestive) heart failure: Secondary | ICD-10-CM

## 2021-05-17 DIAGNOSIS — I10 Essential (primary) hypertension: Secondary | ICD-10-CM

## 2021-05-22 ENCOUNTER — Other Ambulatory Visit: Payer: Self-pay | Admitting: Cardiovascular Disease

## 2021-05-22 DIAGNOSIS — I5042 Chronic combined systolic (congestive) and diastolic (congestive) heart failure: Secondary | ICD-10-CM

## 2021-05-22 NOTE — Telephone Encounter (Signed)
Rx(s) sent to pharmacy electronically.  

## 2021-06-14 ENCOUNTER — Other Ambulatory Visit: Payer: Self-pay | Admitting: Internal Medicine

## 2021-06-14 DIAGNOSIS — I10 Essential (primary) hypertension: Secondary | ICD-10-CM

## 2021-06-14 DIAGNOSIS — I5043 Acute on chronic combined systolic (congestive) and diastolic (congestive) heart failure: Secondary | ICD-10-CM

## 2021-06-14 DIAGNOSIS — I110 Hypertensive heart disease with heart failure: Secondary | ICD-10-CM

## 2021-06-28 ENCOUNTER — Other Ambulatory Visit: Payer: Self-pay | Admitting: Internal Medicine

## 2021-06-28 DIAGNOSIS — F339 Major depressive disorder, recurrent, unspecified: Secondary | ICD-10-CM

## 2021-06-28 DIAGNOSIS — K580 Irritable bowel syndrome with diarrhea: Secondary | ICD-10-CM

## 2021-06-28 DIAGNOSIS — R1084 Generalized abdominal pain: Secondary | ICD-10-CM

## 2021-06-28 DIAGNOSIS — I5042 Chronic combined systolic (congestive) and diastolic (congestive) heart failure: Secondary | ICD-10-CM

## 2021-08-06 ENCOUNTER — Telehealth: Payer: Self-pay

## 2021-08-06 DIAGNOSIS — Z0000 Encounter for general adult medical examination without abnormal findings: Secondary | ICD-10-CM

## 2021-08-06 NOTE — Telephone Encounter (Signed)
Called patient about returning Vivify cuff to Dr. Oval Linsey at [facility] or the pharmacy at [facility]. Patient stated that she need to make an appointment with Dr. Oval Linsey and during that appointment she can return the device. Will track when the patient has been scheduled for a visit at [facility].  Demaris Bousquet [place], [facility] Guide, Health Coach [address] [place] [address] Telephone: [phone] Email: [email or web address]

## 2021-08-09 ENCOUNTER — Other Ambulatory Visit: Payer: Self-pay | Admitting: *Deleted

## 2021-08-09 MED ORDER — VALSARTAN 320 MG PO TABS: 320.0000 mg | ORAL_TABLET | Freq: Every day | ORAL | 3 refills | Status: DC

## 2021-10-01 DIAGNOSIS — R197 Diarrhea, unspecified: Secondary | ICD-10-CM | POA: Diagnosis not present

## 2021-10-01 DIAGNOSIS — R0981 Nasal congestion: Secondary | ICD-10-CM | POA: Diagnosis not present

## 2021-10-01 DIAGNOSIS — R109 Unspecified abdominal pain: Secondary | ICD-10-CM | POA: Diagnosis not present

## 2021-10-01 DIAGNOSIS — I509 Heart failure, unspecified: Secondary | ICD-10-CM | POA: Diagnosis not present

## 2021-10-01 DIAGNOSIS — Z885 Allergy status to narcotic agent status: Secondary | ICD-10-CM | POA: Diagnosis not present

## 2021-10-01 DIAGNOSIS — Z79899 Other long term (current) drug therapy: Secondary | ICD-10-CM | POA: Diagnosis not present

## 2021-10-01 DIAGNOSIS — I161 Hypertensive emergency: Secondary | ICD-10-CM | POA: Diagnosis not present

## 2021-10-01 DIAGNOSIS — T465X6A Underdosing of other antihypertensive drugs, initial encounter: Secondary | ICD-10-CM | POA: Diagnosis not present

## 2021-10-01 DIAGNOSIS — R319 Hematuria, unspecified: Secondary | ICD-10-CM | POA: Diagnosis not present

## 2021-10-01 DIAGNOSIS — R42 Dizziness and giddiness: Secondary | ICD-10-CM | POA: Diagnosis not present

## 2021-10-01 DIAGNOSIS — R519 Headache, unspecified: Secondary | ICD-10-CM | POA: Diagnosis not present

## 2021-10-01 DIAGNOSIS — F17210 Nicotine dependence, cigarettes, uncomplicated: Secondary | ICD-10-CM | POA: Diagnosis not present

## 2021-10-01 DIAGNOSIS — R5381 Other malaise: Secondary | ICD-10-CM | POA: Diagnosis not present

## 2021-10-01 DIAGNOSIS — I10 Essential (primary) hypertension: Secondary | ICD-10-CM | POA: Diagnosis not present

## 2021-10-01 DIAGNOSIS — I130 Hypertensive heart and chronic kidney disease with heart failure and stage 1 through stage 4 chronic kidney disease, or unspecified chronic kidney disease: Secondary | ICD-10-CM | POA: Diagnosis not present

## 2021-10-07 ENCOUNTER — Telehealth: Payer: Self-pay | Admitting: Cardiovascular Disease

## 2021-10-07 NOTE — Telephone Encounter (Signed)
Returned call to patient and provided the following recommendations and confirmed she is taking the following medications.    "Per most recent med list she is to be taking: Coreg 12.'5mg'$  BID Hydralzine '100mg'$  BID Lasix '20mg'$  QD Spironolactone '25mg'$  QD Valsartan '320mg'$  QD   Please ask her to bring pill bottles and BP cuff to her next visit.  If SBP >180 at least 2 hours after her medications may take additional '50mg'$  (half tablet) of her Hydralazine.    Loel Dubonnet, NP "

## 2021-10-07 NOTE — Telephone Encounter (Addendum)
Patient last seen by Dr. Oval Linsey 3/22, was advised to have 2 mo follow up with PharmD and then 6 mo with Oval Linsey, both were no shows. Patient was also instructed to return vivify cuff, due to no shows and inactivity in study. Recently seen in ED for high bp and was instructed to restart Hydralazine, although unclear who told her to stop taking this.   RN returned call to patient to check on blood pressure since leaving ED, she states that she has resumed her hydralazine and pressure today is 202/122. Patient scheduled for 9/20 with Laurann Montana, NP

## 2021-10-07 NOTE — Telephone Encounter (Signed)
Pt c/o BP issue: STAT if pt c/o blurred vision, one-sided weakness or slurred speech  1. What are your last 5 BP readings?  243/172  202/122 - while on the phone   2. Are you having any other symptoms (ex. Dizziness, headache, blurred vision, passed out)? Dizziness, she has to hold on to something when she stand up.  3. What is your BP issue? Pt states she was in [place] and started feeling woozy and her daughter took her to urgent care but they suggested she go to ER. Pt was seen at [facility].

## 2021-10-07 NOTE — Telephone Encounter (Signed)
Per most recent med list she is to be taking: Coreg 12.'5mg'$  BID Hydralzine '100mg'$  BID Lasix '20mg'$  QD Spironolactone '25mg'$  QD Valsartan '320mg'$  QD  Please ask her to bring pill bottles and BP cuff to her next visit.  If SBP >180 at least 2 hours after her medications may take additional '50mg'$  (half tablet) of her Hydralazine.   Loel Dubonnet, NP

## 2021-10-09 ENCOUNTER — Encounter (HOSPITAL_BASED_OUTPATIENT_CLINIC_OR_DEPARTMENT_OTHER): Payer: Self-pay | Admitting: Family

## 2021-10-09 ENCOUNTER — Ambulatory Visit (INDEPENDENT_AMBULATORY_CARE_PROVIDER_SITE_OTHER): Payer: Medicare Other | Admitting: Family

## 2021-10-09 ENCOUNTER — Ambulatory Visit (HOSPITAL_BASED_OUTPATIENT_CLINIC_OR_DEPARTMENT_OTHER): Payer: Medicare Other | Admitting: Family

## 2021-10-09 VITALS — BP 210/110 | HR 83 | Ht 60.0 in | Wt 145.4 lb

## 2021-10-09 DIAGNOSIS — I110 Hypertensive heart disease with heart failure: Secondary | ICD-10-CM | POA: Diagnosis not present

## 2021-10-09 DIAGNOSIS — I10 Essential (primary) hypertension: Secondary | ICD-10-CM | POA: Diagnosis not present

## 2021-10-09 DIAGNOSIS — I5042 Chronic combined systolic (congestive) and diastolic (congestive) heart failure: Secondary | ICD-10-CM

## 2021-10-09 MED ORDER — FUROSEMIDE 20 MG PO TABS: 20.0000 mg | ORAL_TABLET | Freq: Every day | ORAL | 3 refills | Status: DC

## 2021-10-09 MED ORDER — CARVEDILOL 25 MG PO TABS: 25.0000 mg | ORAL_TABLET | Freq: Two times a day (BID) | ORAL | 3 refills | Status: DC

## 2021-10-09 MED ORDER — ASPIRIN 81 MG PO TBEC: 81.0000 mg | DELAYED_RELEASE_TABLET | Freq: Every day | ORAL | 3 refills | Status: AC

## 2021-10-09 MED ORDER — VALSARTAN 320 MG PO TABS: 320.0000 mg | ORAL_TABLET | Freq: Every day | ORAL | 3 refills | Status: DC

## 2021-10-09 MED ORDER — SPIRONOLACTONE 25 MG PO TABS: 25.0000 mg | ORAL_TABLET | Freq: Every day | ORAL | 3 refills | Status: DC

## 2021-10-09 MED ORDER — HYDRALAZINE HCL 100 MG PO TABS: 100.0000 mg | ORAL_TABLET | Freq: Three times a day (TID) | ORAL | 3 refills | Status: DC

## 2021-10-09 MED ORDER — ATORVASTATIN CALCIUM 40 MG PO TABS: ORAL_TABLET | ORAL | 3 refills | Status: DC

## 2021-10-09 NOTE — Patient Instructions (Addendum)
Medication Instructions:  Your physician has recommended you make the following change in your medication:   START Spironolactone '25mg'$  daily  START: Aspirin '81mg'$  daily   CHANGE Hydralazine to '100mg'$  three times per day   *If you need a refill on your cardiac medications before your next appointment, please call your pharmacy*   Lab Work: Your physician recommends that you return for lab work in 1 week at [facility] [employer], thyroid panel, catecholamines, metanephrines, cortisol  Have collected early in the morning and be fasting.   If you have labs (blood work) drawn today and your tests are completely normal, you will receive your results only by: [facility] (if you have MyChart) OR A paper copy in the mail If you have any lab test that is abnormal or we need to change your treatment, we will call you to review the results.   Testing/Procedures: Your physician has requested that you have a renal artery duplex. During this test, an ultrasound is used to evaluate blood flow to the kidneys. Allow one hour for this exam. Do not eat after midnight the day before and avoid carbonated beverages. Take your medications as you usually do.  Follow-Up: At [facility], you and your health needs are our priority.  As part of our continuing mission to provide you with exceptional heart care, we have created designated Provider Care Teams.  These Care Teams include your primary Cardiologist (physician) and Advanced Practice Providers (APPs -  Physician Assistants and Nurse Practitioners) who all work together to provide you with the care you need, when you need it.  We recommend signing up for the patient portal called "MyChart".  Sign up information is provided on this After Visit Summary.  MyChart is used to connect with patients for Virtual Visits (Telemedicine).  Patients are able to view lab/test results, encounter notes, upcoming appointments, etc.  Non-urgent messages can be  sent to your provider as well.   To learn more about what you can do with MyChart, go to [email or web address].    Your next appointment:   1 month(s)  The format for your next appointment:   In Person or Virtual  Provider:   Skeet Latch, MD or Laurann Montana, NP   Other Instructions  Tips to Measure your Blood Pressure Correctly  Here's what you can do to ensure a correct reading:  Don't drink a caffeinated beverage or smoke during the 30 minutes before the test.  Sit quietly for five minutes before the test begins.  During the measurement, sit in a chair with your feet on the floor and your arm supported so your elbow is at about heart level.  The inflatable part of the cuff should completely cover at least 80% of your upper arm, and the cuff should be placed on bare skin, not over a shirt.  Don't talk during the measurement.   Blood pressure categories  Blood pressure category SYSTOLIC (upper number)  DIASTOLIC (lower number)  Normal Less than 120 mm Hg and Less than 80 mm Hg  Elevated 120-129 mm Hg and Less than 80 mm Hg  High blood pressure: Stage 1 hypertension 130-139 mm Hg or 80-89 mm Hg  High blood pressure: Stage 2 hypertension 140 mm Hg or higher or 90 mm Hg or higher  Hypertensive crisis (consult your doctor immediately) Higher than 180 mm Hg and/or Higher than 120 mm Hg  Source: American Heart Association and American Stroke Association. For more on getting your blood  pressure under control, buy Controlling Your Blood Pressure, a Special Health Report from [facility].     Heart Healthy Diet Recommendations: A low-salt diet is recommended. Meats should be grilled, baked, or boiled. Avoid fried foods. Focus on lean protein sources like fish or chicken with vegetables and fruits. The American Heart Association is a Microbiologist!  American Heart Association Diet and Lifeystyle Recommendations   Exercise recommendations: The American Heart  Association recommends 150 minutes of moderate intensity exercise weekly. Try 30 minutes of moderate intensity exercise 4-5 times per week. This could include walking, jogging, or swimming.   Important Information About Sugar
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
## 2021-10-10 ENCOUNTER — Ambulatory Visit (HOSPITAL_BASED_OUTPATIENT_CLINIC_OR_DEPARTMENT_OTHER): Payer: Medicare Other | Admitting: Family

## 2021-10-17 ENCOUNTER — Telehealth (HOSPITAL_BASED_OUTPATIENT_CLINIC_OR_DEPARTMENT_OTHER): Payer: Self-pay

## 2021-10-17 NOTE — Telephone Encounter (Addendum)
Call attempt, no answer, unable to leave message, will try contact again.    ----- Message from Gerald Stabs, RN sent at 10/09/2021  3:26 PM EDT ----- BP for CW

## 2021-10-18 NOTE — Telephone Encounter (Signed)
2nd call attempt, no answer, unable to leave message due to full mail box.

## 2021-10-21 ENCOUNTER — Encounter (HOSPITAL_BASED_OUTPATIENT_CLINIC_OR_DEPARTMENT_OTHER): Payer: Self-pay

## 2021-10-21 NOTE — Telephone Encounter (Signed)
Appreciate nurse trying to reach patient 3x.  Loel Dubonnet, NP

## 2021-10-21 NOTE — Telephone Encounter (Signed)
3rd call attempt, no answer, unable to leave message due to full mail; box will send patient a letter asking her to call us with her blood pressures, NP notified.

## 2021-10-25 DIAGNOSIS — I10 Essential (primary) hypertension: Secondary | ICD-10-CM | POA: Diagnosis not present

## 2021-10-25 DIAGNOSIS — I110 Hypertensive heart disease with heart failure: Secondary | ICD-10-CM | POA: Diagnosis not present

## 2021-10-25 DIAGNOSIS — I5042 Chronic combined systolic (congestive) and diastolic (congestive) heart failure: Secondary | ICD-10-CM | POA: Diagnosis not present

## 2021-10-29 ENCOUNTER — Ambulatory Visit (HOSPITAL_BASED_OUTPATIENT_CLINIC_OR_DEPARTMENT_OTHER): Payer: Medicare Other

## 2021-10-29 DIAGNOSIS — I10 Essential (primary) hypertension: Secondary | ICD-10-CM

## 2021-10-29 DIAGNOSIS — I110 Hypertensive heart disease with heart failure: Secondary | ICD-10-CM

## 2021-10-29 DIAGNOSIS — I5042 Chronic combined systolic (congestive) and diastolic (congestive) heart failure: Secondary | ICD-10-CM

## 2021-11-01 LAB — METANEPHRINES, PLASMA
Metanephrine, Free: 35.6 pg/mL (ref 0.0–88.0)
Normetanephrine, Free: 89.8 pg/mL (ref 0.0–285.2)

## 2021-11-01 LAB — THYROID PANEL WITH TSH
Free Thyroxine Index: 1.9 (ref 1.2–4.9)
T3 Uptake Ratio: 29 % (ref 24–39)
T4, Total: 6.5 ug/dL (ref 4.5–12.0)
TSH: 4.68 u[IU]/mL — ABNORMAL HIGH (ref 0.450–4.500)

## 2021-11-01 LAB — CORTISOL: Cortisol: 14.2 ug/dL (ref 6.2–19.4)

## 2021-11-01 LAB — BASIC METABOLIC PANEL
BUN/Creatinine Ratio: 15 (ref 12–28)
BUN: 22 mg/dL (ref 8–27)
CO2: 21 mmol/L (ref 20–29)
Calcium: 9.5 mg/dL (ref 8.7–10.3)
Chloride: 106 mmol/L (ref 96–106)
Creatinine, Ser: 1.45 mg/dL — ABNORMAL HIGH (ref 0.57–1.00)
Glucose: 88 mg/dL (ref 70–99)
Potassium: 4.1 mmol/L (ref 3.5–5.2)
Sodium: 145 mmol/L — ABNORMAL HIGH (ref 134–144)
eGFR: 39 mL/min/{1.73_m2} — ABNORMAL LOW (ref >59)

## 2021-11-01 LAB — CATECHOLAMINES, FRACTIONATED, PLASMA
Dopamine: <30 pg/mL (ref 0–48)
Epinephrine: 35 pg/mL (ref 0–62)
Norepinephrine: 577 pg/mL (ref 0–874)

## 2021-11-04 ENCOUNTER — Telehealth (HOSPITAL_BASED_OUTPATIENT_CLINIC_OR_DEPARTMENT_OTHER): Payer: Self-pay

## 2021-11-04 DIAGNOSIS — I110 Hypertensive heart disease with heart failure: Secondary | ICD-10-CM

## 2021-11-04 NOTE — Telephone Encounter (Addendum)
Call attempt, no answer, unable to leave message due to full mailbox.    ----- Message from Loel Dubonnet, NP sent at 11/01/2021  4:42 PM EDT ----- Kidney function slightly decreased from previous. Mildly elevated sodium. Normal cortisol, metanephrines, catecholamines. TSH elevated but T3/T4 are normal.   Recommend repeat monitoring of thyroid in a few months with primary care provider.   Recommend reduce Spironolactone to half tablet daily and repeat BMP in 2 weeks.

## 2021-11-05 NOTE — Telephone Encounter (Signed)
2nd call attempt, no answer, unable to leave message due to full mailbox.      ----- Message from Loel Dubonnet, NP sent at 11/01/2021  4:42 PM EDT ----- Kidney function slightly decreased from previous. Mildly elevated sodium. Normal cortisol, metanephrines, catecholamines. TSH elevated but T3/T4 are normal.    Recommend repeat monitoring of thyroid in a few months with primary care provider.    Recommend reduce Spironolactone to half tablet daily and repeat BMP in 2 weeks.

## 2021-11-07 MED ORDER — SPIRONOLACTONE 25 MG PO TABS: 12.5000 mg | ORAL_TABLET | Freq: Every day | ORAL | 3 refills | Status: DC

## 2021-11-07 NOTE — Addendum Note (Signed)
Addended by: Gerald Stabs on: 11/07/2021 10:20 AM   Modules accepted: Orders

## 2021-11-07 NOTE — Telephone Encounter (Signed)
Results called to patient who verbalizes understanding! Rx updated with pharmacy and lab slips mailed to patient.     "----- Message from Loel Dubonnet, NP sent at 11/01/2021  4:42 PM EDT ----- Kidney function slightly decreased from previous. Mildly elevated sodium. Normal cortisol, metanephrines, catecholamines. TSH elevated but T3/T4 are normal.    Recommend repeat monitoring of thyroid in a few months with primary care provider.    Recommend reduce Spironolactone to half tablet daily and repeat BMP in 2 weeks. "

## 2021-11-08 ENCOUNTER — Telehealth (INDEPENDENT_AMBULATORY_CARE_PROVIDER_SITE_OTHER): Payer: Medicare Other | Admitting: Family

## 2021-11-08 ENCOUNTER — Encounter (HOSPITAL_BASED_OUTPATIENT_CLINIC_OR_DEPARTMENT_OTHER): Payer: Self-pay | Admitting: Family

## 2021-11-08 VITALS — BP 153/77 | HR 83 | Ht 60.0 in | Wt 145.0 lb

## 2021-11-08 DIAGNOSIS — I5042 Chronic combined systolic (congestive) and diastolic (congestive) heart failure: Secondary | ICD-10-CM | POA: Diagnosis not present

## 2021-11-08 DIAGNOSIS — I10 Essential (primary) hypertension: Secondary | ICD-10-CM

## 2021-11-08 MED ORDER — FUROSEMIDE 20 MG PO TABS: 20.0000 mg | ORAL_TABLET | Freq: Every day | ORAL | 3 refills | Status: DC
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
## 2021-11-08 NOTE — Patient Instructions (Signed)
Medication Instructions:  Recommend resume Lasix '20mg'$  daily. [facility] [address].   *If you need a refill on your cardiac medications before your next appointment, please call your pharmacy*   Lab Work: Your physician recommends that you return for lab work within the next week: BMP  Please return for Lab work. You may come to the...   [facility] (3rd floor) [address] [place], [place] [address]  Open: 8am-Noon and 1pm-4:30pm  Please ring the doorbell on the small table when you exit the elevator and the Lab Tech will come get you  [place] at [facility] [address] [place], [place], [place] [address] Open: 8am-1pm, then 2pm-4:30pm   [place]- Please see attached locations sheet stapled to your lab work with address and hours.    If you have labs (blood work) drawn today and your tests are completely normal, you will receive your results only by: [place] (if you have MyChart) OR A paper copy in the mail If you have any lab test that is abnormal or we need to change your treatment, we will call you to review the results.   Testing/Procedures: None ordered today   Follow-Up: At [facility], you and your health needs are our priority.  As part of our continuing mission to provide you with exceptional heart care, we have created designated Provider Care Teams.  These Care Teams include your primary Cardiologist (physician) and Advanced Practice Providers (APPs -  Physician Assistants and Nurse Practitioners) who all work together to provide you with the care you need, when you need it.  We recommend signing up for the patient portal called "MyChart".  Sign up information is provided on this After Visit Summary.  MyChart is used to connect with patients for Virtual Visits (Telemedicine).  Patients are able to view lab/test results, encounter notes, upcoming appointments, etc.  Non-urgent messages can be sent  to your provider as well.   To learn more about what you can do with MyChart, go to [email or web address].    Your next appointment:   As scheduled with Loel Dubonnet, NP   Other Instructions  Heart Healthy Diet Recommendations: A low-salt diet is recommended. Meats should be grilled, baked, or boiled. Avoid fried foods. Focus on lean protein sources like fish or chicken with vegetables and fruits. The American Heart Association is a Microbiologist!  American Heart Association Diet and Lifeystyle Recommendations   Exercise recommendations: The American Heart Association recommends 150 minutes of moderate intensity exercise weekly. Try 30 minutes of moderate intensity exercise 4-5 times per week. This could include walking, jogging, or swimming.   Important Information About Sugar

## 2021-11-21 ENCOUNTER — Encounter (HOSPITAL_BASED_OUTPATIENT_CLINIC_OR_DEPARTMENT_OTHER): Payer: Self-pay | Admitting: Family

## 2021-11-21 ENCOUNTER — Ambulatory Visit (INDEPENDENT_AMBULATORY_CARE_PROVIDER_SITE_OTHER): Payer: Medicare Other | Admitting: Family

## 2021-11-21 VITALS — BP 208/89 | HR 61 | Ht 60.0 in | Wt 149.8 lb

## 2021-11-21 DIAGNOSIS — I10 Essential (primary) hypertension: Secondary | ICD-10-CM | POA: Diagnosis not present

## 2021-11-21 DIAGNOSIS — I5042 Chronic combined systolic (congestive) and diastolic (congestive) heart failure: Secondary | ICD-10-CM

## 2021-11-21 LAB — CBC
Hematocrit: 32.3 % — ABNORMAL LOW (ref 34.0–46.6)
Hemoglobin: 10.8 g/dL — ABNORMAL LOW (ref 11.1–15.9)
MCH: 32.2 pg (ref 26.6–33.0)
MCHC: 33.4 g/dL (ref 31.5–35.7)
MCV: 96 fL (ref 79–97)
Platelets: 274 10*3/uL (ref 150–450)
RBC: 3.35 x10E6/uL — ABNORMAL LOW (ref 3.77–5.28)
RDW: 12.3 % (ref 11.7–15.4)
WBC: 8.9 10*3/uL (ref 3.4–10.8)

## 2021-11-21 LAB — BASIC METABOLIC PANEL
BUN/Creatinine Ratio: 8 — ABNORMAL LOW (ref 12–28)
BUN: 11 mg/dL (ref 8–27)
CO2: 22 mmol/L (ref 20–29)
Calcium: 9.6 mg/dL (ref 8.7–10.3)
Chloride: 102 mmol/L (ref 96–106)
Creatinine, Ser: 1.33 mg/dL — ABNORMAL HIGH (ref 0.57–1.00)
Glucose: 98 mg/dL (ref 70–99)
Potassium: 5.2 mmol/L (ref 3.5–5.2)
Sodium: 137 mmol/L (ref 134–144)
eGFR: 43 mL/min/{1.73_m2} — ABNORMAL LOW (ref >59)

## 2021-11-21 MED ORDER — FUROSEMIDE 20 MG PO TABS: 20.0000 mg | ORAL_TABLET | Freq: Every day | ORAL | 3 refills | Status: DC

## 2021-11-21 MED ORDER — DOXAZOSIN MESYLATE 4 MG PO TABS: 4.0000 mg | ORAL_TABLET | Freq: Every day | ORAL | 3 refills | Status: DC

## 2021-11-21 NOTE — Patient Instructions (Addendum)
Medication Instructions:  Your physician has recommended you make the following change in your medication:   STOP: SPIROLACTONE   START: DOXAZOSIN '4MG'$  DAILY   You may take Coricidin over the counter for sinus issues. IF not better in about a week, recommend follow up with primary care provider.    Labwork: Your physician recommends that you return for lab work TODAY- BMP, CBC    Follow-Up:  57MONTH FOLLOW UP:  1/8 10:30 WITH PHARMD AT [facility] OFFICE   4 MONTH FOLLOW UP: 3/20 10:30AM WITH DR. [place] AT [facility] OFFICE   Special Instructions:  DASH Eating Plan DASH stands for Dietary Approaches to Stop Hypertension. The DASH eating plan is a healthy eating plan that has been shown to: Reduce high blood pressure (hypertension). Reduce your risk for type 2 diabetes, heart disease, and stroke. Help with weight loss. What are tips for following this plan? Reading food labels Check food labels for the amount of salt (sodium) per serving. Choose foods with less than 5 percent of the Daily Value of sodium. Generally, foods with less than 300 milligrams (mg) of sodium per serving fit into this eating plan. To find whole grains, look for the word "whole" as the first word in the ingredient list. Shopping Buy products labeled as "low-sodium" or "no salt added." Buy fresh foods. Avoid canned foods and pre-made or frozen meals. Cooking Avoid adding salt when cooking. Use salt-free seasonings or herbs instead of table salt or sea salt. Check with your health care provider or pharmacist before using salt substitutes. Do not fry foods. Cook foods using healthy methods such as baking, boiling, grilling, roasting, and broiling instead. Cook with heart-healthy oils, such as olive, canola, avocado, soybean, or sunflower oil. Meal planning  Eat a balanced diet that includes: 4 or more servings of fruits and 4 or more servings of vegetables each day. Try to fill one-half of your plate with fruits  and vegetables. 6-8 servings of whole grains each day. Less than 6 oz (170 g) of lean meat, poultry, or fish each day. A 3-oz (85-g) serving of meat is about the same size as a deck of cards. One egg equals 1 oz (28 g). 2-3 servings of low-fat dairy each day. One serving is 1 cup (237 mL). 1 serving of nuts, seeds, or beans 5 times each week. 2-3 servings of heart-healthy fats. Healthy fats called omega-3 fatty acids are found in foods such as walnuts, flaxseeds, fortified milks, and eggs. These fats are also found in cold-water fish, such as sardines, salmon, and mackerel. Limit how much you eat of: Canned or prepackaged foods. Food that is high in trans fat, such as some fried foods. Food that is high in saturated fat, such as fatty meat. Desserts and other sweets, sugary drinks, and other foods with added sugar. Full-fat dairy products. Do not salt foods before eating. Do not eat more than 4 egg yolks a week. Try to eat at least 2 vegetarian meals a week. Eat more home-cooked food and less restaurant, buffet, and fast food. Lifestyle When eating at a restaurant, ask that your food be prepared with less salt or no salt, if possible. If you drink alcohol: Limit how much you use to: 0-1 drink a day for women who are not pregnant. 0-2 drinks a day for men. Be aware of how much alcohol is in your drink. In the [place] one drink equals one 12 oz bottle of beer (355 mL), one 5 oz glass of wine (  148 mL), or one 1 oz glass of hard liquor (44 mL). General information Avoid eating more than 2,300 mg of salt a day. If you have hypertension, you may need to reduce your sodium intake to 1,500 mg a day. Work with your health care provider to maintain a healthy body weight or to lose weight. Ask what an ideal weight is for you. Get at least 30 minutes of exercise that causes your heart to beat faster (aerobic exercise) most days of the week. Activities may include walking, swimming, or biking. Work with  your health care provider or dietitian to adjust your eating plan to your individual calorie needs. What foods should I eat? Fruits All fresh, dried, or frozen fruit. Canned fruit in natural juice (without added sugar). Vegetables Fresh or frozen vegetables (raw, steamed, roasted, or grilled). Low-sodium or reduced-sodium tomato and vegetable juice. Low-sodium or reduced-sodium tomato sauce and tomato paste. Low-sodium or reduced-sodium canned vegetables. Grains Whole-grain or whole-wheat bread. Whole-grain or whole-wheat pasta. Brown rice. [place]. Bulgur. Whole-grain and low-sodium cereals. Pita bread. Low-fat, low-sodium crackers. Whole-wheat flour tortillas. Meats and other proteins Skinless chicken or [place]. Ground chicken or [place]. Pork with fat trimmed off. Fish and seafood. Egg whites. Dried beans, peas, or lentils. Unsalted nuts, nut butters, and seeds. Unsalted canned beans. Lean cuts of beef with fat trimmed off. Low-sodium, lean precooked or cured meat, such as sausages or meat loaves. Dairy Low-fat (1%) or fat-free (skim) milk. Reduced-fat, low-fat, or fat-free cheeses. Nonfat, low-sodium ricotta or cottage cheese. Low-fat or nonfat yogurt. Low-fat, low-sodium cheese. Fats and oils Soft margarine without trans fats. Vegetable oil. Reduced-fat, low-fat, or light mayonnaise and salad dressings (reduced-sodium). Canola, safflower, olive, avocado, soybean, and sunflower oils. Avocado. Seasonings and condiments Herbs. Spices. Seasoning mixes without salt. Other foods Unsalted popcorn and pretzels. Fat-free sweets. The items listed above may not be a complete list of foods and beverages you can eat. Contact a dietitian for more information. What foods should I avoid? Fruits Canned fruit in a light or heavy syrup. Fried fruit. Fruit in cream or butter sauce. Vegetables Creamed or fried vegetables. Vegetables in a cheese sauce. Regular canned vegetables (not low-sodium or  reduced-sodium). Regular canned tomato sauce and paste (not low-sodium or reduced-sodium). Regular tomato and vegetable juice (not low-sodium or reduced-sodium). Angie Fava. Olives. Grains Baked goods made with fat, such as croissants, muffins, or some breads. Dry pasta or rice meal packs. Meats and other proteins Fatty cuts of meat. Ribs. Fried meat. Berniece Salines. Bologna, salami, and other precooked or cured meats, such as sausages or meat loaves. Fat from the back of a pig (fatback). Bratwurst. Salted nuts and seeds. Canned beans with added salt. Canned or smoked fish. Whole eggs or egg yolks. Chicken or [place] with skin. Dairy Whole or 2% milk, cream, and half-and-half. Whole or full-fat cream cheese. Whole-fat or sweetened yogurt. Full-fat cheese. Nondairy creamers. Whipped toppings. Processed cheese and cheese spreads. Fats and oils Butter. Stick margarine. Lard. Shortening. Ghee. Bacon fat. Tropical oils, such as coconut, palm kernel, or palm oil. Seasonings and condiments Onion salt, garlic salt, seasoned salt, table salt, and sea salt. Worcestershire sauce. Tartar sauce. Barbecue sauce. Teriyaki sauce. Soy sauce, including reduced-sodium. Steak sauce. Canned and packaged gravies. Fish sauce. Oyster sauce. Cocktail sauce. Store-bought horseradish. Ketchup. Mustard. Meat flavorings and tenderizers. Bouillon cubes. Hot sauces. Pre-made or packaged marinades. Pre-made or packaged taco seasonings. Relishes. Regular salad dressings. Other foods Salted popcorn and pretzels. The items listed above may not be  a complete list of foods and beverages you should avoid. Contact a dietitian for more information. Where to find more information National Heart, Lung, and Blood Institute: https://Riona Lahti-eaton.com/ American Heart Association: www.heart.org Academy of Nutrition and Dietetics: www.eatright.[place]: www.kidney.org Summary The DASH eating plan is a healthy eating plan that has been shown  to reduce high blood pressure (hypertension). It may also reduce your risk for type 2 diabetes, heart disease, and stroke. When on the DASH eating plan, aim to eat more fresh fruits and vegetables, whole grains, lean proteins, low-fat dairy, and heart-healthy fats. With the DASH eating plan, you should limit salt (sodium) intake to 2,300 mg a day. If you have hypertension, you may need to reduce your sodium intake to 1,500 mg a day. Work with your health care provider or dietitian to adjust your eating plan to your individual calorie needs. This information is not intended to replace advice given to you by your health care provider. Make sure you discuss any questions you have with your health care provider. Document Revised: 12/10/2018 Document Reviewed: 12/10/2018 Elsevier Patient Education  [place].
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
## 2021-11-22 ENCOUNTER — Telehealth (HOSPITAL_BASED_OUTPATIENT_CLINIC_OR_DEPARTMENT_OTHER): Payer: Self-pay

## 2021-11-22 ENCOUNTER — Telehealth: Payer: Self-pay

## 2021-11-22 DIAGNOSIS — Z0000 Encounter for general adult medical examination without abnormal findings: Secondary | ICD-10-CM

## 2021-11-22 NOTE — Telephone Encounter (Addendum)
Results called to patient who verbalizes understanding!    ----- Message from Loel Dubonnet, NP sent at 11/22/2021  7:54 AM EDT ----- Stable mild anemia. Kidney function improving. Continue with plan as discussed in clinic visit.

## 2021-11-22 NOTE — Telephone Encounter (Signed)
Called patient to conduct Welcome call and discuss missing readings. Patient was not aware of the times that she was to check her blood pressure. Asked if current prompt times work for her. Patient verbally expressed that she did not need her prompt times adjusted. Patient is a repeat participant in the program and stated that she is familiar with the device and process of the program. Patient had no additional questions or concerns.   Abegail Kloeppel [facility] Guide, Health Coach [address], [address] [place] [address] Telephone: [phone] Email: [email or web address]

## 2021-11-27 ENCOUNTER — Telehealth (HOSPITAL_BASED_OUTPATIENT_CLINIC_OR_DEPARTMENT_OTHER): Payer: Self-pay | Admitting: Cardiovascular Disease

## 2021-11-27 NOTE — Telephone Encounter (Signed)
Called patient to verify mailing address as we had return mail and she states she is having problems with her blood pressure machine--it will not connect and she is having to enter her blood pressure results manually.  Please advise.

## 2021-11-28 NOTE — Telephone Encounter (Signed)
Spoke with patient and advised to try shutting phone completely off, restarting to see if that works Advised of patient if this did not help to call back and let me know

## 2021-12-03 ENCOUNTER — Telehealth: Payer: Self-pay

## 2021-12-03 DIAGNOSIS — Z0000 Encounter for general adult medical examination without abnormal findings: Secondary | ICD-10-CM

## 2021-12-03 NOTE — Telephone Encounter (Signed)
Logged into Vivify and patient has not checked blood pressure since 11/9 Tried to call, vm full

## 2021-12-03 NOTE — Telephone Encounter (Signed)
Called patient to discuss missing bp readings in [place]. Was not able to leave a message on her mobile phone. Called number listed for home and her husband answered and stated it is his cell phone number not the home number. Patient's husband stated that he would have her return the call when he gets home.    Coulton Schlink Truman Hayward [facility] Guide, Health Coach [address] [place] [address] Telephone: [phone] Email: [email or web address]

## 2021-12-06 ENCOUNTER — Telehealth: Payer: Self-pay

## 2021-12-06 DIAGNOSIS — Z0000 Encounter for general adult medical examination without abnormal findings: Secondary | ICD-10-CM

## 2021-12-06 NOTE — Telephone Encounter (Signed)
Called patient due to continuation of missing bp readings in [place]. Spoke with her husband on 11/14 and he stated that he would have her return the call. Was not able to leave a message because her voicemail is full.    Ignacio Lowder Truman Hayward [facility] Guide, Health Coach [address], [address] [place] [address] Telephone: [phone] Email: [email or web address]

## 2021-12-16 ENCOUNTER — Telehealth: Payer: Self-pay

## 2021-12-16 DIAGNOSIS — Z0000 Encounter for general adult medical examination without abnormal findings: Secondary | ICD-10-CM

## 2021-12-16 NOTE — Telephone Encounter (Signed)
Called patient to discuss missing bp readings. Patient stated that she had spoke to someone last week regarding her having to manually enter her readings and wasn't sure if they were going through. Patient stated that she would bring in her device later this week. Offered to submit a ticket to tech support for assistance. Patient verbally agreed and the ticket was submitted today (case #[id number]). Will follow up with patient after receiving notification from tech support.

## 2021-12-20 ENCOUNTER — Other Ambulatory Visit: Payer: Self-pay | Admitting: Internal Medicine

## 2021-12-20 DIAGNOSIS — G4700 Insomnia, unspecified: Secondary | ICD-10-CM

## 2021-12-21 DIAGNOSIS — I10 Essential (primary) hypertension: Secondary | ICD-10-CM

## 2022-01-02 ENCOUNTER — Telehealth: Payer: Self-pay

## 2022-01-02 ENCOUNTER — Telehealth (HOSPITAL_BASED_OUTPATIENT_CLINIC_OR_DEPARTMENT_OTHER): Payer: Self-pay

## 2022-01-02 DIAGNOSIS — I5042 Chronic combined systolic (congestive) and diastolic (congestive) heart failure: Secondary | ICD-10-CM

## 2022-01-02 DIAGNOSIS — Z0000 Encounter for general adult medical examination without abnormal findings: Secondary | ICD-10-CM

## 2022-01-02 NOTE — Telephone Encounter (Signed)
Called patient to determine if she have questions about her medications as indicated by her survey response in [place]. Patient did not answer. Was unable to leave a voicemail because mailbox is full.   Glenyce Randle Truman Hayward [facility] Guide, Health Coach [address], [address] [place] [address] Telephone: [phone] Email: [email or web address]

## 2022-01-02 NOTE — Telephone Encounter (Signed)
Discontinue Doxazosin. If she has difficulty with a medication she needs to call or send a MyChart message so we are aware. Her BP via Vivify has been very labile.   Spironolactone previously discontinued as she was having difficulty splitting in half and concern her kidneys would not tolerate increased dose.   Ensure taking Carvedilol '25mg'$  BID, Lasix '20mg'$  daily, Hydralazine '100mg'$  TID, Valsartan '320mg'$  daily.   She may increase Furosemide to '40mg'$  daily with repeat BMP in 2 weeks.   Loel Dubonnet, NP

## 2022-01-02 NOTE — Telephone Encounter (Signed)
Returned call to patient,   She states she was started on doxazosin and for 3 days she felt horrible, exhaustion, dizziness. She got the medicine a few days after her appointment and then stopped taking it 3 days after that. She also notes that from her knee down feels like it is asleep all the time. Patient wants to know if she should restart her spironolactone or not. Told patient I would send this over to Laurann Montana, NP for review along with her VIVIFY blood pressure readings to see what we need to do.    Sent the following information via secure chat,  "Hi team! This patient indicated in [place] that she have questions about her medication. She returned my call to verify if that was an accident and the patient stated that since starting doxazosin she has been experiencing dizziness. Patient is concerned that she was taken off of spironolactone as well. Could someone reach out to her? Thanks!"

## 2022-01-02 NOTE — Telephone Encounter (Signed)
Patient returned phone call. Patient does have questions about her medications and how to take them. Forwarded concern to Laurann Montana, NP regarding dizziness patient is experiencing after starting the most recent medication prescribed. Patient reported that she is not able to access the Vivify app and cannot bypass a white screen. Submitted a ticket on behalf of patient to [employer] tech support. Case #[id number].  Lacy Sofia Truman Hayward [facility] Guide, Health Coach [address] [place] [address] Telephone: [phone] Email: [email or web address]

## 2022-01-03 MED ORDER — FUROSEMIDE 40 MG PO TABS: 40.0000 mg | ORAL_TABLET | Freq: Every day | ORAL | 3 refills | Status: DC

## 2022-01-03 NOTE — Telephone Encounter (Signed)
Returned call to patient, provided the following recommendations. Patient verbalizes understanding and is agreeable for med changes and labs in 2 weeks.     "Discontinue Doxazosin. If she has difficulty with a medication she needs to call or send a MyChart message so we are aware. Her BP via Vivify has been very labile.    Spironolactone previously discontinued as she was having difficulty splitting in half and concern her kidneys would not tolerate increased dose.    Ensure taking Carvedilol '25mg'$  BID, Lasix '20mg'$  daily, Hydralazine '100mg'$  TID, Valsartan '320mg'$  daily.    She may increase Furosemide to '40mg'$  daily with repeat BMP in 2 weeks.    Julia Dubonnet, NP"

## 2022-01-03 NOTE — Addendum Note (Signed)
Addended by: Gerald Stabs on: 01/03/2022 11:10 AM   Modules accepted: Orders

## 2022-01-20 ENCOUNTER — Other Ambulatory Visit: Payer: Self-pay | Admitting: Internal Medicine

## 2022-01-21 NOTE — Telephone Encounter (Signed)
Not on current medication list.  Okay to refill? 

## 2022-01-22 ENCOUNTER — Telehealth: Payer: Self-pay

## 2022-01-22 DIAGNOSIS — Z0000 Encounter for general adult medical examination without abnormal findings: Secondary | ICD-10-CM

## 2022-01-22 NOTE — Telephone Encounter (Signed)
Called patient per survey response in [place] regarding questions about her medications and missing bp readings. Patient stated she is having to enter her readings manually and wanted to ensure that we are receiving them. Confirmed receipt of one reading per day and manual entry of readings. Patient suggested turning off her phone to reset before contacting tech support to see if that helps. Patient requested that her prompt time for her pm reading be changed to 7pm. Prompt time was updated per patient's request. Patient stated that she was informed to resume taking half a pill of spironolactone 25 mg and wanted to know if it comes in a lower dose because she is having difficulty breaking the pill. Sent message to Dr. Oval Linsey and Rip Harbour to address patient's medication questions. Will monitor Vivify to determine if patient continues to have connectivity issues before submitting a ticket to tech support.    Avelino Leeds, MS, ERHD, [facility]  Care Guide, Health & Wellness Coach [address] [place]  [address] Telephone: [phone] Email: [email or web address]

## 2022-01-27 ENCOUNTER — Ambulatory Visit: Payer: Medicare Other | Attending: Cardiology
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
## 2022-01-29 ENCOUNTER — Telehealth: Payer: Self-pay

## 2022-01-29 DIAGNOSIS — Z0000 Encounter for general adult medical examination without abnormal findings: Secondary | ICD-10-CM

## 2022-01-29 NOTE — Telephone Encounter (Signed)
Called patient to determine if she was available for an appointment to see the pharmacist on 1/18 at 10:00am to discuss her medication concerns. Patient stated that she will be out of town until 1/22 and would like to be schedule after this time to discuss her medications. Informed Erasmo Downer of patient's statement for further follow up.   Avelino Leeds, MS, ERHD, [facility]  Care Guide, Health & Wellness Coach [address] [place] [address] Telephone: [phone] Email: [email or web address]

## 2022-01-31 NOTE — Telephone Encounter (Signed)
Patient has been checking her blood pressure, information in [place]

## 2022-02-02 ENCOUNTER — Other Ambulatory Visit: Payer: Self-pay | Admitting: Internal Medicine

## 2022-02-02 DIAGNOSIS — F339 Major depressive disorder, recurrent, unspecified: Secondary | ICD-10-CM

## 2022-02-04 ENCOUNTER — Telehealth (HOSPITAL_BASED_OUTPATIENT_CLINIC_OR_DEPARTMENT_OTHER): Payer: Self-pay | Admitting: *Deleted

## 2022-02-04 DIAGNOSIS — D649 Anemia, unspecified: Secondary | ICD-10-CM

## 2022-02-04 DIAGNOSIS — I10 Essential (primary) hypertension: Secondary | ICD-10-CM

## 2022-02-04 DIAGNOSIS — Z5181 Encounter for therapeutic drug level monitoring: Secondary | ICD-10-CM

## 2022-02-04 NOTE — Telephone Encounter (Signed)
-----  Message from Loel Dubonnet, NP sent at 02/02/2022  4:54 PM EST ----- Regarding: RE: Medication concerns Hi Amy,   Not sure who told her to resume Spironolactone .By my note 11/2 we had stopped as she could not split in half and were not certain her kidneys would tolerate whole tablet.   Last note I have is increasing Furosemide 12/14 to '40mg'$  daily with repeat BMP in 2 weeks. She has not yet had repeat labs done.   Rip Harbour - can you call to confirm what she is taking? Should bee off Spironolactone from my review of chart and taking Furosemide '40mg'$  daily.  Best,  Loel Dubonnet, NP  ----- Message ----- From: Avelino Leeds Sent: 01/22/2022  12:22 PM EST To: Skeet Latch, MD; Earvin Hansen, LPN Subject: Medication concerns                            Hi team,  Patient stated that she was informed to resume taking half a pill of spironolactone 25 mg but is having difficulty breaking it. Patient inquired if the pill comes in a lower dose because it is helping her. Patient also stated that she is picking up lasix today and is confused on what to do in regards to taking both medications. Could someone reach out to her to discuss her medication regimen?  Thanks, Avelino Leeds, MS, ERHD, [facility] Care Guide, Health & Wellness Coach [address] [place] [address] Telephone: [phone] Email: [email or web address]

## 2022-02-04 NOTE — Telephone Encounter (Signed)
Cindy with research tried to call patient earlier today, I reached out to patient Both of [place] received message voicemail not set up

## 2022-02-05 NOTE — Telephone Encounter (Signed)
If she is out of town in a city with a [facility] would recommend she go ahead and get BMP/CBC.   Recommend hold Lasix until we have updated renal function based on labs as high doses can put strain on kidneys.   Thank you for calling her!  Loel Dubonnet, NP

## 2022-02-05 NOTE — Telephone Encounter (Signed)
Spoke with patient regarding medications She has not been taking the Spironolactone, d/c month or so ago Started Doxazosin but did not tolerate, see phone note 12/14 Unfortunately she misunderstood the Lasix directions and has been taking Lasix 40 mg 4 tablets daily She did decrease her Lasix to 40 mg 2 daily about a week ago  Has been having some dizziness, mostly when going from sitting to standing position  SBP while taking 4 tablets daily were running around 100  Since decreasing her Lasix to 40 mg 2 daily SBP has been running 112-118  She is currently out of town and will be back Sunday  Aware she will need labs when returns Monday, would also like CBC checked.   Advised patient to hold Lasix in am, already had today  Will forward to [place] for review

## 2022-02-06 NOTE — Telephone Encounter (Signed)
Advised patient, verbalized understanding  

## 2022-02-06 NOTE — Telephone Encounter (Signed)
No answer

## 2022-02-17 ENCOUNTER — Other Ambulatory Visit: Payer: Self-pay | Admitting: Internal Medicine

## 2022-02-17 DIAGNOSIS — F339 Major depressive disorder, recurrent, unspecified: Secondary | ICD-10-CM

## 2022-02-20 ENCOUNTER — Other Ambulatory Visit: Payer: Self-pay | Admitting: Internal Medicine

## 2022-02-20 DIAGNOSIS — F339 Major depressive disorder, recurrent, unspecified: Secondary | ICD-10-CM

## 2022-02-27 ENCOUNTER — Ambulatory Visit: Payer: Medicare Other | Attending: Cardiology | Admitting: Student

## 2022-02-27 VITALS — BP 118/72 | HR 75

## 2022-02-27 DIAGNOSIS — Z5181 Encounter for therapeutic drug level monitoring: Secondary | ICD-10-CM | POA: Diagnosis not present

## 2022-02-27 DIAGNOSIS — I10 Essential (primary) hypertension: Secondary | ICD-10-CM

## 2022-02-27 DIAGNOSIS — D649 Anemia, unspecified: Secondary | ICD-10-CM | POA: Diagnosis not present

## 2022-02-27 NOTE — Patient Instructions (Addendum)
Changes made by your pharmacist Cammy Copa, PharmD at today's visit:    Instructions/Changes  (what do you need to do) Your Notes  (what you did and when you did it)  Reduce furosemide 40 mg to 20 mg daily   2.   Continue taking valsartan 320 mg daily, carvedilol 25 mg twice daily,   3. Start doing modified chair exercise along with regular walks     Bring all of your meds, your BP cuff and your record of home blood pressures to your next appointment.    HOW TO TAKE YOUR BLOOD PRESSURE AT HOME  Rest 5 minutes before taking your blood pressure.  Don't smoke or drink caffeinated beverages for at least 30 minutes before. Take your blood pressure before (not after) you eat. Sit comfortably with your back supported and both feet on the floor (don't cross your legs). Elevate your arm to heart level on a table or a desk. Use the proper sized cuff. It should fit smoothly and snugly around your bare upper arm. There should be enough room to slip a fingertip under the cuff. The bottom edge of the cuff should be 1 inch above the crease of the elbow. Ideally, take 3 measurements at one sitting and record the average.  Important lifestyle changes to control high blood pressure  Intervention  Effect on the BP  Lose extra pounds and watch your waistline Weight loss is one of the most effective lifestyle changes for controlling blood pressure. If you're overweight or obese, losing even a small amount of weight can help reduce blood pressure. Blood pressure might go down by about 1 millimeter of mercury (mm Hg) with each kilogram (about 2.2 pounds) of weight lost.  Exercise regularly As a general goal, aim for at least 30 minutes of moderate physical activity every day. Regular physical activity can lower high blood pressure by about 5 to 8 mm Hg.  Eat a healthy diet Eating a diet rich in whole grains, fruits, vegetables, and low-fat dairy products and low in saturated fat and cholesterol. A  healthy diet can lower high blood pressure by up to 11 mm Hg.  Reduce salt (sodium) in your diet Even a small reduction of sodium in the diet can improve heart health and reduce high blood pressure by about 5 to 6 mm Hg.  Limit alcohol One drink equals 12 ounces of beer, 5 ounces of wine, or 1.5 ounces of 80-proof liquor.  Limiting alcohol to less than one drink a day for women or two drinks a day for men can help lower blood pressure by about 4 mm Hg.   If you have any questions or concerns please use My Chart to send questions or call the office at [phone]
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
## 2022-02-27 NOTE — Assessment & Plan Note (Signed)
Assessment: BP is controlled (goal <130/80) in office BP 118/72 with heart rate 75 . Takes BP medications regularly uses pill box Gets dizzy since started taking Lasix 40 mg daily- one episode of falling (got up too quickly from chair and got dizzy) Denies SOB, palpitation, chest pain, headaches,or swelling Reiterated the importance of regular exercise and low salt diet  Given multiple home readings <120/70 will consider reducing loop diuretics  Follow up BMP is not done yet after increasing dose of Lasix from 20 mg to 40 mg   Plan:  Reduce dose of furosemide from 40 mg daily to 20 mg daily  Continue taking valsartan 320 mg daily, carvedilol 25 mg twice daily, hydralazin 100 mg three times daily Get BMP checked today  Patient to keep record of BP readings with heart rate and report to [place] at the next visit Patient to see PharmD in 4 weeks for follow up  Follow up lab(s): none

## 2022-02-28 ENCOUNTER — Telehealth (HOSPITAL_BASED_OUTPATIENT_CLINIC_OR_DEPARTMENT_OTHER): Payer: Self-pay

## 2022-02-28 DIAGNOSIS — I10 Essential (primary) hypertension: Secondary | ICD-10-CM

## 2022-02-28 LAB — CBC WITH DIFFERENTIAL/PLATELET
Basophils Absolute: 0.1 10*3/uL (ref 0.0–0.2)
Basos: 1 %
EOS (ABSOLUTE): 0.2 10*3/uL (ref 0.0–0.4)
Eos: 2 %
Hematocrit: 30 % — ABNORMAL LOW (ref 34.0–46.6)
Hemoglobin: 10.1 g/dL — ABNORMAL LOW (ref 11.1–15.9)
Immature Grans (Abs): 0 10*3/uL (ref 0.0–0.1)
Immature Granulocytes: 0 %
Lymphocytes Absolute: 2.4 10*3/uL (ref 0.7–3.1)
Lymphs: 36 %
MCH: 32.3 pg (ref 26.6–33.0)
MCHC: 33.7 g/dL (ref 31.5–35.7)
MCV: 96 fL (ref 79–97)
Monocytes Absolute: 0.7 10*3/uL (ref 0.1–0.9)
Monocytes: 11 %
Neutrophils Absolute: 3.3 10*3/uL (ref 1.4–7.0)
Neutrophils: 50 %
Platelets: 293 10*3/uL (ref 150–450)
RBC: 3.13 x10E6/uL — ABNORMAL LOW (ref 3.77–5.28)
RDW: 12.3 % (ref 11.7–15.4)
WBC: 6.6 10*3/uL (ref 3.4–10.8)

## 2022-02-28 LAB — BASIC METABOLIC PANEL
BUN/Creatinine Ratio: 17 (ref 12–28)
BUN: 30 mg/dL — ABNORMAL HIGH (ref 8–27)
CO2: 21 mmol/L (ref 20–29)
Calcium: 9.1 mg/dL (ref 8.7–10.3)
Chloride: 98 mmol/L (ref 96–106)
Creatinine, Ser: 1.8 mg/dL — ABNORMAL HIGH (ref 0.57–1.00)
Glucose: 85 mg/dL (ref 70–99)
Potassium: 3.9 mmol/L (ref 3.5–5.2)
Sodium: 137 mmol/L (ref 134–144)
eGFR: 30 mL/min/{1.73_m2} — ABNORMAL LOW (ref >59)

## 2022-02-28 NOTE — Telephone Encounter (Addendum)
Call attempt, no answer, mailbox full unable to leave message!     ----- Message from Loel Dubonnet, NP sent at 02/28/2022 11:05 AM EST ----- CBC with stable anemia. Kidney function slightly decreased from previous. Reducing Lasix from 54m to 258mas discussed with pharmacy team yesterday will help. Recommend repeat BMP in 2 weeks for monitoring.

## 2022-03-03 NOTE — Addendum Note (Signed)
Addended by: Gerald Stabs on: 03/03/2022 09:35 AM   Modules accepted: Orders

## 2022-03-03 NOTE — Telephone Encounter (Signed)
Results called to patient who verbalizes understanding! Labs ordered and mailed to patient.      ----- Message from Loel Dubonnet, NP sent at 02/28/2022 11:05 AM EST ----- CBC with stable anemia. Kidney function slightly decreased from previous. Reducing Lasix from 59m to 214mas discussed with pharmacy team yesterday will help. Recommend repeat BMP in 2 weeks for monitoring.

## 2022-03-06 NOTE — Telephone Encounter (Signed)
See 2/9 phone note

## 2022-03-12 ENCOUNTER — Telehealth: Payer: Self-pay

## 2022-03-12 DIAGNOSIS — Z0000 Encounter for general adult medical examination without abnormal findings: Secondary | ICD-10-CM

## 2022-03-12 NOTE — Telephone Encounter (Signed)
Called patient because she has not been compliant with checking her bp twice daily with Vivify. Patient stated that she has been feeling bad for some a few days and haven't been checking her blood pressure. Discussed with patient the importance of checking it and seeking care. Patient stated that she will resume today and reported that she still has been taking her medication. Will continue to monitor patient for incoming bp readings in [place].  Avelino Leeds, MS, ERHD, [facility]  Care Guide, Health & Wellness Coach [address], [address] [place] [address] Telephone: [phone] Email: [email or web address]

## 2022-03-13 ENCOUNTER — Telehealth (INDEPENDENT_AMBULATORY_CARE_PROVIDER_SITE_OTHER): Payer: Medicare Other | Admitting: Internal Medicine

## 2022-03-13 ENCOUNTER — Encounter: Payer: Self-pay | Admitting: Internal Medicine

## 2022-03-13 VITALS — BP 120/75 | HR 65 | Temp 98.4°F | Wt 135.2 lb

## 2022-03-13 DIAGNOSIS — J069 Acute upper respiratory infection, unspecified: Secondary | ICD-10-CM | POA: Diagnosis not present

## 2022-03-13 DIAGNOSIS — R059 Cough, unspecified: Secondary | ICD-10-CM | POA: Diagnosis not present

## 2022-03-13 LAB — POC COVID19 BINAXNOW: SARS Coronavirus 2 Ag: NEGATIVE

## 2022-03-13 LAB — POCT INFLUENZA A/B
Influenza A, POC: NEGATIVE
Influenza B, POC: NEGATIVE
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
## 2022-03-18 ENCOUNTER — Emergency Department (HOSPITAL_BASED_OUTPATIENT_CLINIC_OR_DEPARTMENT_OTHER)
Admission: EM | Admit: 2022-03-18 | Discharge: 2022-03-18 | Disposition: A | Discharge status: Home / Self Care | Payer: Medicare Other | Attending: Emergency Medicine | Admitting: Emergency Medicine

## 2022-03-18 ENCOUNTER — Emergency Department (HOSPITAL_BASED_OUTPATIENT_CLINIC_OR_DEPARTMENT_OTHER): Payer: Medicare Other | Admitting: Radiology

## 2022-03-18 ENCOUNTER — Other Ambulatory Visit: Payer: Self-pay

## 2022-03-18 ENCOUNTER — Encounter (HOSPITAL_BASED_OUTPATIENT_CLINIC_OR_DEPARTMENT_OTHER): Payer: Self-pay

## 2022-03-18 DIAGNOSIS — I509 Heart failure, unspecified: Secondary | ICD-10-CM | POA: Insufficient documentation

## 2022-03-18 DIAGNOSIS — N189 Chronic kidney disease, unspecified: Secondary | ICD-10-CM | POA: Diagnosis not present

## 2022-03-18 DIAGNOSIS — J40 Bronchitis, not specified as acute or chronic: Secondary | ICD-10-CM | POA: Diagnosis not present

## 2022-03-18 DIAGNOSIS — R059 Cough, unspecified: Secondary | ICD-10-CM | POA: Insufficient documentation

## 2022-03-18 DIAGNOSIS — R197 Diarrhea, unspecified: Secondary | ICD-10-CM | POA: Insufficient documentation

## 2022-03-18 DIAGNOSIS — R079 Chest pain, unspecified: Secondary | ICD-10-CM | POA: Diagnosis not present

## 2022-03-18 DIAGNOSIS — Z79899 Other long term (current) drug therapy: Secondary | ICD-10-CM | POA: Diagnosis not present

## 2022-03-18 DIAGNOSIS — Z1152 Encounter for screening for COVID-19: Secondary | ICD-10-CM | POA: Diagnosis not present

## 2022-03-18 DIAGNOSIS — R638 Other symptoms and signs concerning food and fluid intake: Secondary | ICD-10-CM | POA: Diagnosis not present

## 2022-03-18 DIAGNOSIS — I130 Hypertensive heart and chronic kidney disease with heart failure and stage 1 through stage 4 chronic kidney disease, or unspecified chronic kidney disease: Secondary | ICD-10-CM | POA: Diagnosis not present

## 2022-03-18 DIAGNOSIS — Z7982 Long term (current) use of aspirin: Secondary | ICD-10-CM | POA: Insufficient documentation

## 2022-03-18 DIAGNOSIS — R0602 Shortness of breath: Secondary | ICD-10-CM | POA: Diagnosis present

## 2022-03-18 LAB — BASIC METABOLIC PANEL
Anion gap: 11 (ref 5–15)
BUN: 14 mg/dL (ref 8–23)
CO2: 21 mmol/L — ABNORMAL LOW (ref 22–32)
Calcium: 9.6 mg/dL (ref 8.9–10.3)
Chloride: 105 mmol/L (ref 98–111)
Creatinine, Ser: 1.43 mg/dL — ABNORMAL HIGH (ref 0.44–1.00)
GFR, Estimated: 39 mL/min — ABNORMAL LOW (ref >60)
Glucose, Bld: 112 mg/dL — ABNORMAL HIGH (ref 70–99)
Potassium: 3.7 mmol/L (ref 3.5–5.1)
Sodium: 137 mmol/L (ref 135–145)

## 2022-03-18 LAB — CBC
HCT: 29 % — ABNORMAL LOW (ref 36.0–46.0)
Hemoglobin: 10 g/dL — ABNORMAL LOW (ref 12.0–15.0)
MCH: 32.2 pg (ref 26.0–34.0)
MCHC: 34.5 g/dL (ref 30.0–36.0)
MCV: 93.2 fL (ref 80.0–100.0)
Platelets: 274 10*3/uL (ref 150–400)
RBC: 3.11 MIL/uL — ABNORMAL LOW (ref 3.87–5.11)
RDW: 13.9 % (ref 11.5–15.5)
WBC: 4 10*3/uL (ref 4.0–10.5)
nRBC: 0 % (ref 0.0–0.2)

## 2022-03-18 LAB — RESP PANEL BY RT-PCR (RSV, FLU A&B, COVID)  RVPGX2
Influenza A by PCR: NEGATIVE
Influenza B by PCR: NEGATIVE
Resp Syncytial Virus by PCR: NEGATIVE
SARS Coronavirus 2 by RT PCR: NEGATIVE

## 2022-03-18 LAB — TROPONIN I (HIGH SENSITIVITY): Troponin I (High Sensitivity): 7 ng/L (ref <18)

## 2022-03-18 MED ORDER — METHYLPREDNISOLONE 4 MG PO TBPK: ORAL_TABLET | ORAL | 0 refills | Status: DC

## 2022-03-18 MED ORDER — DOXYCYCLINE HYCLATE 100 MG PO TABS
100.0000 mg | ORAL_TABLET | Freq: Once | ORAL | Status: AC
  Administered 2022-03-18: 100 mg via ORAL
  Filled 2022-03-18: qty 1

## 2022-03-18 MED ORDER — DOXYCYCLINE HYCLATE 100 MG PO CAPS: 100.0000 mg | ORAL_CAPSULE | Freq: Two times a day (BID) | ORAL | 0 refills | Status: DC

## 2022-03-18 MED ORDER — BENZONATATE 100 MG PO CAPS: 100.0000 mg | ORAL_CAPSULE | Freq: Two times a day (BID) | ORAL | 0 refills | Status: DC

## 2022-03-18 NOTE — ED Notes (Signed)
Pt given discharge instructions and reviewed prescriptions. Opportunities given for questions. Pt verbalizes understanding. Leanne Chang, RN

## 2022-03-18 NOTE — ED Triage Notes (Signed)
Patient here POV from Home.  Endorses SOB that began this AM. States she has been sick for approximately 1 Week with Cold-Like Symptoms.   No Known fevers. No Sore Throat.   NAD noted during Triage. A&Ox4. GCS 15. Ambulatory.

## 2022-03-18 NOTE — ED Provider Notes (Signed)
[facility] Provider Note   CSN: [id number] Arrival date & time: 03/18/22  1310     History  Chief Complaint  Patient presents with   Shortness of Breath    [name] is a 72 y.o. female.  HPI   72 year old female with past medical history of HTN, CHF, HLD, CKD presents emergency department with ongoing upper respiratory symptoms.  Patient states over a week ago her grandson came over who was sick.  Since then she has been feeling under the weather with productive cough of green phlegm, chills.  Over the last couple days she has had significantly decreased appetite and mild diarrhea.  Denies any nausea/vomiting.  She has no chest pain or back pain.  No lower extremity swelling.  She is otherwise been compliant with her medications.  Home Medications Prior to Admission medications   Medication Sig Start Date End Date Taking? Authorizing Provider  albuterol (VENTOLIN HFA) 108 (90 Base) MCG/ACT inhaler INHALE 2 PUFFS BY MOUTH EVERY 4 HOURS AS NEEDED FOR WHEEZE OR FOR SHORTNESS OF BREATH 06/14/21   Isaac Bliss, Rayford Halsted, MD  aspirin EC 81 MG tablet Take 1 tablet (81 mg total) by mouth daily. Swallow whole. 10/09/21   Loel Dubonnet, NP  atorvastatin (LIPITOR) 40 MG tablet TAKE 1 TABLET BY MOUTH DAILY AT 6 PM. 10/09/21   Loel Dubonnet, NP  carvedilol (COREG) 25 MG tablet Take 1 tablet (25 mg total) by mouth 2 (two) times daily. 10/09/21   Loel Dubonnet, NP  diphenoxylate-atropine (LOMOTIL) 2.5-0.025 MG tablet Take 1 tablet by mouth 2 (two) times daily as needed for diarrhea or loose stools. 01/25/19   Isaac Bliss, Rayford Halsted, MD  doxazosin (CARDURA) 4 MG tablet Take 4 mg by mouth daily. 02/28/22   [provider]  fluticasone (FLONASE) 50 MCG/ACT nasal spray SPRAY 2 SPRAYS INTO EACH NOSTRIL EVERY DAY 01/24/20   Isaac Bliss, Rayford Halsted, MD  furosemide (LASIX) 40 MG tablet Take 1 tablet (40 mg total) by mouth  daily. Patient taking differently: Take 20 mg by mouth daily. 01/03/22   Loel Dubonnet, NP  hydrALAZINE (APRESOLINE) 100 MG tablet Take 1 tablet (100 mg total) by mouth 3 (three) times daily. 10/09/21 10/04/22  Loel Dubonnet, NP  mirtazapine (REMERON) 15 MG tablet TAKE 1 TABLET BY MOUTH EVERYDAY AT BEDTIME 01/22/22   Isaac Bliss, Rayford Halsted, MD  pantoprazole (PROTONIX) 20 MG tablet TAKE 1 TABLET BY MOUTH EVERY DAY 07/24/20   Isaac Bliss, Rayford Halsted, MD  spironolactone (ALDACTONE) 25 MG tablet Take 25 mg by mouth daily. 02/19/22   [provider]  traZODone (DESYREL) 50 MG tablet TAKE 0.5-1 TABLETS BY MOUTH AT BEDTIME AS NEEDED FOR SLEEP. 12/23/21   Isaac Bliss, Rayford Halsted, MD  valsartan (DIOVAN) 320 MG tablet Take 1 tablet (320 mg total) by mouth daily. 10/09/21   Loel Dubonnet, NP      Allergies    Morphine and related    Review of Systems   Review of Systems  Constitutional:  Positive for appetite change and fatigue. Negative for fever.  Respiratory:  Positive for cough and shortness of breath. Negative for chest tightness and wheezing.   Cardiovascular:  Negative for chest pain, palpitations and leg swelling.  Gastrointestinal:  Negative for abdominal pain, diarrhea and vomiting.  Genitourinary:  Negative for flank pain.  Musculoskeletal:  Negative for back pain.  Skin:  Negative for rash.  Neurological:  Negative for  dizziness, syncope, light-headedness and headaches.    Physical Exam Updated Vital Signs BP (!) 144/78 (BP Location: Right Arm)   Pulse 73   Temp 98.7 F (37.1 C)   Resp 20   Ht 5' (1.524 m)   Wt 61.3 kg   SpO2 100%   BMI 26.39 kg/m  Physical Exam Vitals and nursing note reviewed.  Constitutional:      General: She is not in acute distress.    Appearance: Normal appearance. She is not ill-appearing.  HENT:     Head: Normocephalic.     Mouth/Throat:     Mouth: Mucous membranes are moist.  Cardiovascular:     Rate and Rhythm: Normal  rate.  Pulmonary:     Effort: Pulmonary effort is normal. No tachypnea, accessory muscle usage or respiratory distress.     Breath sounds: Examination of the right-lower field reveals decreased breath sounds and rales. Examination of the left-lower field reveals decreased breath sounds and rales. Decreased breath sounds and rales present.  Abdominal:     Palpations: Abdomen is soft.     Tenderness: There is no abdominal tenderness.  Musculoskeletal:     Right lower leg: No edema.     Left lower leg: No edema.  Skin:    General: Skin is warm.  Neurological:     Mental Status: She is alert and oriented to person, place, and time. Mental status is at baseline.  Psychiatric:        Mood and Affect: Mood normal.     ED Results / Procedures / Treatments   Labs (all labs ordered are listed, but only abnormal results are displayed) Labs Reviewed  BASIC METABOLIC PANEL - Abnormal; Notable for the following components:      Result Value   CO2 21 (*)    Glucose, Bld 112 (*)    Creatinine, Ser 1.43 (*)    GFR, Estimated 39 (*)    All other components within normal limits  CBC - Abnormal; Notable for the following components:   RBC 3.11 (*)    Hemoglobin 10.0 (*)    HCT 29.0 (*)    All other components within normal limits  RESP PANEL BY RT-PCR (RSV, FLU A&B, COVID)  RVPGX2  TROPONIN I (HIGH SENSITIVITY)  TROPONIN I (HIGH SENSITIVITY)    EKG EKG Interpretation  Date/Time:  Tuesday March 18 2022 13:21:31 EST Ventricular Rate:  74 PR Interval:  160 QRS Duration: 88 QT Interval:  426 QTC Calculation: 472 R Axis:   69 Text Interpretation: Normal sinus rhythm Minimal voltage criteria for LVH, may be normal variant ( Sokolow-Lyon ) Nonspecific ST abnormality Abnormal ECG When compared with ECG of 26-Dec-2018 05:55, T wave inversion no longer evident in Inferior leads T wave inversion no longer evident in Anterolateral leads QT has shortened Confirmed by Lavenia Atlas [phone]) on  03/18/2022 3:15:18 PM  Radiology DG Chest 2 View  Result Date: 03/18/2022 CLINICAL DATA:  Provided history: Chest pain. EXAM: CHEST - 2 VIEW COMPARISON:  Prior chest radiographs 12/28/2018 and earlier. FINDINGS: Heart size within normal limits. Aortic atherosclerosis. No appreciable airspace consolidation or pulmonary edema. No evidence of pleural effusion or pneumothorax. Lower thoracic vertebral compression fracture (tentatively T12) with 30% height loss. ACDF hardware. IMPRESSION: No evidence of acute cardiopulmonary abnormality. Aortic Atherosclerosis (ICD10-I70.0). Lower thoracic vertebral compression fracture (tentatively T12) with 30% height loss. This is new from the prior chest radiographs of 12/28/2018, but otherwise age-indeterminate. Electronically Signed   By: Kellie Simmering  D.O.   On: 03/18/2022 14:04    Procedures Procedures    Medications Ordered in ED Medications  doxycycline (VIBRA-TABS) tablet 100 mg (has no administration in time range)    ED Course/ Medical Decision Making/ A&P                             Medical Decision Making Amount and/or Complexity of Data Reviewed Labs: ordered. Radiology: ordered.  Risk Prescription drug management.   72 year old female presents emergency department with productive cough, chills, decreased appetite.  Vital signs are stable on arrival.  She is fatigued appearing but otherwise well.  EKG shows no acute ischemic changes.  Blood work is reassuring, baseline anemia and baseline CKD.  Troponin is negative.  Low suspicion for ACS, no active chest pain/back pain.  Chest x-ray shows no acute cardiopulmonary findings.  Low suspicion for CHF, no pitting edema.  This most likely sounds like a viral URI that started over a week ago transitioning to a bacterial bronchitis.  Will plan to treat the patient with antibiotics, steroids and close outpatient follow-up.  Patient at this time appears safe and stable for discharge and close  outpatient follow up. Discharge plan and strict return to ED precautions discussed, patient verbalizes understanding and agreement.        Final Clinical Impression(s) / ED Diagnoses Final diagnoses:  None    Rx / DC Orders ED Discharge Orders     None         Lorelle Gibbs, DO 03/18/22 1633

## 2022-03-20 ENCOUNTER — Ambulatory Visit: Payer: Medicare Other | Admitting: Internal Medicine

## 2022-03-20 ENCOUNTER — Other Ambulatory Visit: Payer: Self-pay | Admitting: Internal Medicine

## 2022-03-20 DIAGNOSIS — G4700 Insomnia, unspecified: Secondary | ICD-10-CM

## 2022-03-26 ENCOUNTER — Telehealth: Payer: Self-pay

## 2022-03-26 ENCOUNTER — Ambulatory Visit: Payer: Medicare Other

## 2022-03-26 NOTE — Telephone Encounter (Signed)
        Patient  visited [facility] on 2/27   Telephone encounter attempt :1st    A HIPAA compliant voice message was left requesting a return call.  Instructed patient to call back.   [facility] [phone] [address], [place], [place] [address] Phone: [phone] Email: [email or web address]

## 2022-03-27 ENCOUNTER — Telehealth: Payer: Self-pay

## 2022-03-27 NOTE — Telephone Encounter (Signed)
     Patient  visit on 2/27  at [facility]    Have you been able to follow up with your primary care physician? No   The patient was or was not able to obtain any needed medicine or equipment. Yes   Are there diet recommendations that you are having difficulty following? Na   Patient expresses understanding of discharge instructions and education provided has no other needs at this time.  Yes     [facility] [phone] [address], [place], [place] [address] Phone: [phone] Email: [email or web address]

## 2022-04-09 ENCOUNTER — Telehealth (HOSPITAL_BASED_OUTPATIENT_CLINIC_OR_DEPARTMENT_OTHER): Payer: Self-pay

## 2022-04-09 ENCOUNTER — Ambulatory Visit (HOSPITAL_BASED_OUTPATIENT_CLINIC_OR_DEPARTMENT_OTHER): Payer: Medicare Other | Admitting: Cardiovascular Disease

## 2022-04-09 DIAGNOSIS — Z0000 Encounter for general adult medical examination without abnormal findings: Secondary | ICD-10-CM

## 2022-04-09 NOTE — Telephone Encounter (Signed)
Called patient to inform her to bring the Vivify cuff back to return to Dr. Oval Linsey during her visit today. Was unable to leave a message due to voicemail being full.   Avelino Leeds, MS, ERHD, [facility]  Care Guide, Health & Wellness Coach [address] [place] [address] Telephone: [phone] Email: [email or web address]
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
## 2022-04-17 ENCOUNTER — Telehealth: Payer: Self-pay

## 2022-04-17 DIAGNOSIS — Z0000 Encounter for general adult medical examination without abnormal findings: Secondary | ICD-10-CM

## 2022-04-17 NOTE — Telephone Encounter (Signed)
Called patient to determine if she has questions regarding her medications as indicated in [place]. Patient did not answer and was unable to leave a message because voicemail in full.   Avelino Leeds, MS, ERHD, [facility]  Care Guide, Health & Wellness Coach [address], [address] [place] [address] Telephone: [phone] Email: [email or web address]

## 2022-05-17 ENCOUNTER — Other Ambulatory Visit: Payer: Self-pay | Admitting: Internal Medicine

## 2022-05-19 ENCOUNTER — Telehealth: Payer: Self-pay

## 2022-05-19 DIAGNOSIS — Z0000 Encounter for general adult medical examination without abnormal findings: Secondary | ICD-10-CM

## 2022-05-19 NOTE — Telephone Encounter (Signed)
Called patient to discuss compliance with Viv[employer] to check her bp twice daily and remind her about her missed appt that has not been rescheduled from 3/20 with Dr. Duke Salvia. Patient did not answer and was unable to leave a message because voicemail was full.   Renaee Munda, MS, ERHD, [facility]  Care Guide, Health & Wellness Coach [address], [address] [place] [address] Telephone: [phone] Email: [email or web address]

## 2022-05-23 ENCOUNTER — Telehealth: Payer: Self-pay

## 2022-05-23 DIAGNOSIS — Z0000 Encounter for general adult medical examination without abnormal findings: Secondary | ICD-10-CM

## 2022-05-23 NOTE — Telephone Encounter (Signed)
Returned patient's call regarding Vivify device not working. Patient stated that she was sent a pin but it states its invalid. Submitted a ticket for tech support to reach out to patient for further assistance. Reminded patient about missed appt on 3/20 so that she can reschedule. Patient stated that she would call to reschedule her appointment today.    Renaee Munda, MS, ERHD, [facility]  Care Guide, Health & Wellness Coach [address], [address] [place] [address] Telephone: [phone] Email: [email or web address]

## 2022-05-28 ENCOUNTER — Telehealth: Payer: Self-pay

## 2022-05-28 DIAGNOSIS — Z0000 Encounter for general adult medical examination without abnormal findings: Secondary | ICD-10-CM

## 2022-05-28 NOTE — Telephone Encounter (Signed)
Called this patient due to tech support email stating they were unable to get in contact with her during their 4th attempt. Patient stated that she was unable to answer the phone at that time and explained that tech support was unable to resolve her connectivity issue on 5/3. She inquired if she could come to the office to swap devices. She also needs to reschedule her missed appointment in March. Updated Regis Bill, LPN, of the patient's concerns for further follow up.   Renaee Munda, MS, ERHD, [facility]  Care Guide, Health & Wellness Coach [address], [address] [place] [address] Telephone: [phone] Email: [email or web address]

## 2022-05-30 ENCOUNTER — Telehealth (HOSPITAL_BASED_OUTPATIENT_CLINIC_OR_DEPARTMENT_OTHER): Payer: Self-pay | Admitting: *Deleted

## 2022-05-30 NOTE — Telephone Encounter (Addendum)
Spoke with patient regarding Vivify issues She will come Monday with machine to see if we can correct issue

## 2022-06-02 ENCOUNTER — Other Ambulatory Visit: Payer: Self-pay | Admitting: Internal Medicine

## 2022-06-02 DIAGNOSIS — F339 Major depressive disorder, recurrent, unspecified: Secondary | ICD-10-CM

## 2022-06-03 ENCOUNTER — Other Ambulatory Visit: Payer: Self-pay | Admitting: Internal Medicine

## 2022-06-18 ENCOUNTER — Other Ambulatory Visit: Payer: Self-pay | Admitting: Internal Medicine

## 2022-06-18 DIAGNOSIS — G4700 Insomnia, unspecified: Secondary | ICD-10-CM

## 2022-06-19 ENCOUNTER — Telehealth: Payer: Self-pay | Admitting: Cardiovascular Disease

## 2022-06-19 NOTE — Telephone Encounter (Signed)
Returned call to patient, no answer, mailbox full.

## 2022-06-19 NOTE — Telephone Encounter (Signed)
Per Ronn Melena NP ok to end study.  Need to return monitor when she has follow up

## 2022-06-19 NOTE — Telephone Encounter (Signed)
Pt calling back stating her Vivify machine is still not working. She' states she came by the office for someone to fix it but its back broken.

## 2022-06-20 NOTE — Telephone Encounter (Signed)
Per Lily Kocher okay for patient to end study. She needs to bring cuff to next office visit.   Returned call to patient, reviewed the above recommendations with the patient who verbalizes understanding. She will return cuff at her next office visit.

## 2022-07-03 ENCOUNTER — Other Ambulatory Visit: Payer: Self-pay | Admitting: Internal Medicine

## 2022-07-03 DIAGNOSIS — I10 Essential (primary) hypertension: Secondary | ICD-10-CM

## 2022-07-03 DIAGNOSIS — I5043 Acute on chronic combined systolic (congestive) and diastolic (congestive) heart failure: Secondary | ICD-10-CM

## 2022-07-03 DIAGNOSIS — I110 Hypertensive heart disease with heart failure: Secondary | ICD-10-CM

## 2022-07-16 ENCOUNTER — Ambulatory Visit (INDEPENDENT_AMBULATORY_CARE_PROVIDER_SITE_OTHER): Payer: Medicare Other | Admitting: Family

## 2022-07-16 ENCOUNTER — Encounter (HOSPITAL_BASED_OUTPATIENT_CLINIC_OR_DEPARTMENT_OTHER): Payer: Self-pay | Admitting: Family

## 2022-07-16 ENCOUNTER — Telehealth (HOSPITAL_BASED_OUTPATIENT_CLINIC_OR_DEPARTMENT_OTHER): Payer: Self-pay

## 2022-07-16 VITALS — BP 104/65 | HR 76 | Ht 60.0 in | Wt 130.0 lb

## 2022-07-16 DIAGNOSIS — D649 Anemia, unspecified: Secondary | ICD-10-CM | POA: Diagnosis not present

## 2022-07-16 DIAGNOSIS — I5042 Chronic combined systolic (congestive) and diastolic (congestive) heart failure: Secondary | ICD-10-CM | POA: Diagnosis not present

## 2022-07-16 DIAGNOSIS — E782 Mixed hyperlipidemia: Secondary | ICD-10-CM | POA: Diagnosis not present

## 2022-07-16 DIAGNOSIS — I10 Essential (primary) hypertension: Secondary | ICD-10-CM | POA: Diagnosis not present

## 2022-07-16 DIAGNOSIS — I700 Atherosclerosis of aorta: Secondary | ICD-10-CM | POA: Diagnosis not present

## 2022-07-16 LAB — COMPREHENSIVE METABOLIC PANEL
ALT: 9 IU/L (ref 0–32)
AST: 16 IU/L (ref 0–40)
Albumin: 4.5 g/dL (ref 3.9–4.9)
Alkaline Phosphatase: 83 IU/L (ref 44–121)
BUN/Creatinine Ratio: 15 (ref 12–28)
BUN: 31 mg/dL — ABNORMAL HIGH (ref 8–27)
Bilirubin Total: <0.2 mg/dL (ref 0.0–1.2)
CO2: 18 mmol/L — ABNORMAL LOW (ref 20–29)
Calcium: 9.5 mg/dL (ref 8.7–10.3)
Chloride: 99 mmol/L (ref 96–106)
Creatinine, Ser: 2.06 mg/dL — ABNORMAL HIGH (ref 0.57–1.00)
Globulin, Total: 2.7 g/dL (ref 1.5–4.5)
Glucose: 105 mg/dL — ABNORMAL HIGH (ref 70–99)
Potassium: 4 mmol/L (ref 3.5–5.2)
Sodium: 134 mmol/L (ref 134–144)
Total Protein: 7.2 g/dL (ref 6.0–8.5)
eGFR: 25 mL/min/{1.73_m2} — ABNORMAL LOW (ref >59)

## 2022-07-16 LAB — LIPID PANEL
Chol/HDL Ratio: 2 ratio (ref 0.0–4.4)
Cholesterol, Total: 129 mg/dL (ref 100–199)
HDL: 66 mg/dL (ref >39)
LDL Chol Calc (NIH): 49 mg/dL (ref 0–99)
Triglycerides: 65 mg/dL (ref 0–149)
VLDL Cholesterol Cal: 14 mg/dL (ref 5–40)

## 2022-07-16 LAB — CBC
Hematocrit: 20.5 % — ABNORMAL LOW (ref 34.0–46.6)
Hemoglobin: 6.3 g/dL — CL (ref 11.1–15.9)
MCH: 25.6 pg — ABNORMAL LOW (ref 26.6–33.0)
MCHC: 30.7 g/dL — ABNORMAL LOW (ref 31.5–35.7)
MCV: 83 fL (ref 79–97)
Platelets: 381 10*3/uL (ref 150–450)
RBC: 2.46 x10E6/uL — CL (ref 3.77–5.28)
RDW: 14.6 % (ref 11.7–15.4)
WBC: 7.1 10*3/uL (ref 3.4–10.8)

## 2022-07-16 MED ORDER — CARVEDILOL 25 MG PO TABS: 25.0000 mg | ORAL_TABLET | Freq: Two times a day (BID) | ORAL | 3 refills | Status: DC

## 2022-07-16 MED ORDER — HYDRALAZINE HCL 100 MG PO TABS: 100.0000 mg | ORAL_TABLET | Freq: Three times a day (TID) | ORAL | 3 refills | Status: DC

## 2022-07-16 MED ORDER — ATORVASTATIN CALCIUM 40 MG PO TABS: ORAL_TABLET | ORAL | 3 refills | Status: DC

## 2022-07-16 MED ORDER — FUROSEMIDE 20 MG PO TABS: 20.0000 mg | ORAL_TABLET | Freq: Every day | ORAL | 1 refills | Status: DC

## 2022-07-16 MED ORDER — VALSARTAN 320 MG PO TABS: 320.0000 mg | ORAL_TABLET | Freq: Every day | ORAL | 3 refills | Status: DC

## 2022-07-16 NOTE — Telephone Encounter (Signed)
Patient completed study today, device returned

## 2022-07-16 NOTE — Patient Instructions (Addendum)
Medication Instructions:  Continue your current medications.    Follow-Up: 10/10 AT 10:05AM WITH Gillian Shields, NP   Special Instructions:   Keep up the great work with heart healthy diet! Recommend aiming for 150 minutes of moderate intensity activity per week.  If your blood pressure is less than 110/60 and you feel lightheaded or dizzy, eat something and drink some water and this will help to naturally increase your blood pressure.   To prevent lightheadedness and dizziness: Make position changes slowly Eat regular meals Stay well hydrated (but try not to exceed 2 liters or 64 oz of fluids)  Options for primary care:  Dr. Dorothyann Peng [facility] [address] [place], [place] [address] [phone]  Or  Any provider located at [facility] [address] [place],  [place]  [address] Main: [phone]
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
## 2022-07-17 ENCOUNTER — Other Ambulatory Visit: Payer: Self-pay

## 2022-07-17 ENCOUNTER — Encounter (HOSPITAL_COMMUNITY): Payer: Self-pay | Admitting: Internal Medicine

## 2022-07-17 ENCOUNTER — Inpatient Hospital Stay (HOSPITAL_COMMUNITY)
Admission: EM | Admit: 2022-07-17 | Discharge: 2022-07-19 | DRG: 378 | Disposition: A | Discharge status: Home / Self Care | Payer: Medicare Other | Attending: Internal Medicine | Admitting: Internal Medicine

## 2022-07-17 ENCOUNTER — Telehealth (HOSPITAL_BASED_OUTPATIENT_CLINIC_OR_DEPARTMENT_OTHER): Payer: Self-pay | Admitting: Family

## 2022-07-17 DIAGNOSIS — K5520 Angiodysplasia of colon without hemorrhage: Secondary | ICD-10-CM | POA: Diagnosis present

## 2022-07-17 DIAGNOSIS — R194 Change in bowel habit: Secondary | ICD-10-CM | POA: Diagnosis not present

## 2022-07-17 DIAGNOSIS — D123 Benign neoplasm of transverse colon: Secondary | ICD-10-CM

## 2022-07-17 DIAGNOSIS — K31819 Angiodysplasia of stomach and duodenum without bleeding: Secondary | ICD-10-CM | POA: Diagnosis not present

## 2022-07-17 DIAGNOSIS — G479 Sleep disorder, unspecified: Secondary | ICD-10-CM | POA: Diagnosis present

## 2022-07-17 DIAGNOSIS — Z833 Family history of diabetes mellitus: Secondary | ICD-10-CM

## 2022-07-17 DIAGNOSIS — F32A Depression, unspecified: Secondary | ICD-10-CM | POA: Diagnosis present

## 2022-07-17 DIAGNOSIS — D62 Acute posthemorrhagic anemia: Secondary | ICD-10-CM | POA: Diagnosis present

## 2022-07-17 DIAGNOSIS — D124 Benign neoplasm of descending colon: Secondary | ICD-10-CM

## 2022-07-17 DIAGNOSIS — D126 Benign neoplasm of colon, unspecified: Secondary | ICD-10-CM | POA: Diagnosis not present

## 2022-07-17 DIAGNOSIS — K31811 Angiodysplasia of stomach and duodenum with bleeding: Secondary | ICD-10-CM | POA: Diagnosis present

## 2022-07-17 DIAGNOSIS — D127 Benign neoplasm of rectosigmoid junction: Secondary | ICD-10-CM

## 2022-07-17 DIAGNOSIS — Z885 Allergy status to narcotic agent status: Secondary | ICD-10-CM | POA: Diagnosis not present

## 2022-07-17 DIAGNOSIS — R634 Abnormal weight loss: Secondary | ICD-10-CM | POA: Diagnosis not present

## 2022-07-17 DIAGNOSIS — Z87891 Personal history of nicotine dependence: Secondary | ICD-10-CM

## 2022-07-17 DIAGNOSIS — G894 Chronic pain syndrome: Secondary | ICD-10-CM | POA: Diagnosis present

## 2022-07-17 DIAGNOSIS — Z800 Family history of malignant neoplasm of digestive organs: Secondary | ICD-10-CM

## 2022-07-17 DIAGNOSIS — D649 Anemia, unspecified: Secondary | ICD-10-CM | POA: Diagnosis not present

## 2022-07-17 DIAGNOSIS — D125 Benign neoplasm of sigmoid colon: Secondary | ICD-10-CM | POA: Diagnosis present

## 2022-07-17 DIAGNOSIS — I428 Other cardiomyopathies: Secondary | ICD-10-CM | POA: Diagnosis present

## 2022-07-17 DIAGNOSIS — I130 Hypertensive heart and chronic kidney disease with heart failure and stage 1 through stage 4 chronic kidney disease, or unspecified chronic kidney disease: Secondary | ICD-10-CM | POA: Diagnosis present

## 2022-07-17 DIAGNOSIS — K2900 Acute gastritis without bleeding: Secondary | ICD-10-CM | POA: Diagnosis not present

## 2022-07-17 DIAGNOSIS — Z8041 Family history of malignant neoplasm of ovary: Secondary | ICD-10-CM

## 2022-07-17 DIAGNOSIS — F419 Anxiety disorder, unspecified: Secondary | ICD-10-CM | POA: Diagnosis present

## 2022-07-17 DIAGNOSIS — N1832 Chronic kidney disease, stage 3b: Secondary | ICD-10-CM | POA: Diagnosis present

## 2022-07-17 DIAGNOSIS — D509 Iron deficiency anemia, unspecified: Secondary | ICD-10-CM | POA: Diagnosis not present

## 2022-07-17 DIAGNOSIS — M47812 Spondylosis without myelopathy or radiculopathy, cervical region: Secondary | ICD-10-CM | POA: Diagnosis present

## 2022-07-17 DIAGNOSIS — N183 Chronic kidney disease, stage 3 unspecified: Secondary | ICD-10-CM | POA: Diagnosis not present

## 2022-07-17 DIAGNOSIS — E785 Hyperlipidemia, unspecified: Secondary | ICD-10-CM | POA: Diagnosis present

## 2022-07-17 DIAGNOSIS — Z79899 Other long term (current) drug therapy: Secondary | ICD-10-CM

## 2022-07-17 DIAGNOSIS — Z90710 Acquired absence of both cervix and uterus: Secondary | ICD-10-CM

## 2022-07-17 DIAGNOSIS — Z823 Family history of stroke: Secondary | ICD-10-CM

## 2022-07-17 DIAGNOSIS — I5042 Chronic combined systolic (congestive) and diastolic (congestive) heart failure: Secondary | ICD-10-CM | POA: Diagnosis not present

## 2022-07-17 DIAGNOSIS — D6489 Other specified anemias: Secondary | ICD-10-CM | POA: Diagnosis not present

## 2022-07-17 DIAGNOSIS — Z7982 Long term (current) use of aspirin: Secondary | ICD-10-CM

## 2022-07-17 DIAGNOSIS — K5730 Diverticulosis of large intestine without perforation or abscess without bleeding: Secondary | ICD-10-CM | POA: Diagnosis present

## 2022-07-17 DIAGNOSIS — Z8249 Family history of ischemic heart disease and other diseases of the circulatory system: Secondary | ICD-10-CM

## 2022-07-17 DIAGNOSIS — K635 Polyp of colon: Secondary | ICD-10-CM | POA: Diagnosis not present

## 2022-07-17 DIAGNOSIS — K219 Gastro-esophageal reflux disease without esophagitis: Secondary | ICD-10-CM | POA: Diagnosis present

## 2022-07-17 DIAGNOSIS — K2970 Gastritis, unspecified, without bleeding: Secondary | ICD-10-CM | POA: Diagnosis not present

## 2022-07-17 HISTORY — DX: Other specified health status: Z78.9

## 2022-07-17 LAB — COMPREHENSIVE METABOLIC PANEL
ALT: 13 U/L (ref 0–44)
AST: 19 U/L (ref 15–41)
Albumin: 4 g/dL (ref 3.5–5.0)
Alkaline Phosphatase: 62 U/L (ref 38–126)
Anion gap: 10 (ref 5–15)
BUN: 26 mg/dL — ABNORMAL HIGH (ref 8–23)
CO2: 20 mmol/L — ABNORMAL LOW (ref 22–32)
Calcium: 9.1 mg/dL (ref 8.9–10.3)
Chloride: 103 mmol/L (ref 98–111)
Creatinine, Ser: 1.84 mg/dL — ABNORMAL HIGH (ref 0.44–1.00)
GFR, Estimated: 29 mL/min — ABNORMAL LOW (ref >60)
Glucose, Bld: 109 mg/dL — ABNORMAL HIGH (ref 70–99)
Potassium: 3.8 mmol/L (ref 3.5–5.1)
Sodium: 133 mmol/L — ABNORMAL LOW (ref 135–145)
Total Bilirubin: 0.5 mg/dL (ref 0.3–1.2)
Total Protein: 7.2 g/dL (ref 6.5–8.1)

## 2022-07-17 LAB — CBC
HCT: 19.9 % — ABNORMAL LOW (ref 36.0–46.0)
Hemoglobin: 6.1 g/dL — CL (ref 12.0–15.0)
MCH: 25.2 pg — ABNORMAL LOW (ref 26.0–34.0)
MCHC: 30.7 g/dL (ref 30.0–36.0)
MCV: 82.2 fL (ref 80.0–100.0)
Platelets: 372 10*3/uL (ref 150–400)
RBC: 2.42 MIL/uL — ABNORMAL LOW (ref 3.87–5.11)
RDW: 15.4 % (ref 11.5–15.5)
WBC: 4.3 10*3/uL (ref 4.0–10.5)
nRBC: 0 % (ref 0.0–0.2)

## 2022-07-17 LAB — RETICULOCYTES
Immature Retic Fract: 20.7 % — ABNORMAL HIGH (ref 2.3–15.9)
RBC.: 2.42 MIL/uL — ABNORMAL LOW (ref 3.87–5.11)
Retic Count, Absolute: 35.8 10*3/uL (ref 19.0–186.0)
Retic Ct Pct: 1.5 % (ref 0.4–3.1)

## 2022-07-17 LAB — FERRITIN: Ferritin: 5 ng/mL — ABNORMAL LOW (ref 11–307)

## 2022-07-17 LAB — BPAM RBC: Unit Type and Rh: 5100

## 2022-07-17 LAB — PREPARE RBC (CROSSMATCH)

## 2022-07-17 LAB — HEMOGLOBIN AND HEMATOCRIT, BLOOD
HCT: 23.5 % — ABNORMAL LOW (ref 36.0–46.0)
Hemoglobin: 7.6 g/dL — ABNORMAL LOW (ref 12.0–15.0)

## 2022-07-17 LAB — POC OCCULT BLOOD, ED: Fecal Occult Bld: NEGATIVE

## 2022-07-17 LAB — HIV ANTIBODY (ROUTINE TESTING W REFLEX): HIV Screen 4th Generation wRfx: NONREACTIVE

## 2022-07-17 LAB — IRON AND TIBC
Iron: 10 ug/dL — ABNORMAL LOW (ref 28–170)
Saturation Ratios: 2 % — ABNORMAL LOW (ref 10.4–31.8)
TIBC: 489 ug/dL — ABNORMAL HIGH (ref 250–450)
UIBC: 479 ug/dL

## 2022-07-17 MED ORDER — PEG-KCL-NACL-NASULF-NA ASC-C 100 G PO SOLR
0.5000 | Freq: Once | ORAL | Status: AC
  Administered 2022-07-18: 100 g via ORAL
  Filled 2022-07-17: qty 1

## 2022-07-17 MED ORDER — FERROUS SULFATE 325 (65 FE) MG PO TABS
325.0000 mg | ORAL_TABLET | Freq: Every day | ORAL | Status: DC
  Administered 2022-07-18 – 2022-07-19 (×2): 325 mg via ORAL
  Filled 2022-07-17 (×2): qty 1

## 2022-07-17 MED ORDER — PEG-KCL-NACL-NASULF-NA ASC-C 100 G PO SOLR: 1.0000 | Freq: Once | ORAL | Status: DC

## 2022-07-17 MED ORDER — SODIUM CHLORIDE 0.9% IV SOLUTION: Freq: Once | INTRAVENOUS | Status: AC

## 2022-07-17 MED ORDER — MIRTAZAPINE 15 MG PO TABS
15.0000 mg | ORAL_TABLET | Freq: Every day | ORAL | Status: DC
  Administered 2022-07-17 – 2022-07-18 (×2): 15 mg via ORAL
  Filled 2022-07-17 (×2): qty 1

## 2022-07-17 MED ORDER — FUROSEMIDE 20 MG PO TABS
20.0000 mg | ORAL_TABLET | Freq: Every day | ORAL | Status: DC
  Administered 2022-07-17 – 2022-07-19 (×3): 20 mg via ORAL
  Filled 2022-07-17 (×3): qty 1

## 2022-07-17 MED ORDER — FLUTICASONE PROPIONATE 50 MCG/ACT NA SUSP: 1.0000 | Freq: Every day | NASAL | Status: DC

## 2022-07-17 MED ORDER — ASPIRIN 81 MG PO TBEC
81.0000 mg | DELAYED_RELEASE_TABLET | Freq: Every day | ORAL | Status: DC
  Administered 2022-07-17 – 2022-07-19 (×3): 81 mg via ORAL
  Filled 2022-07-17 (×3): qty 1

## 2022-07-17 MED ORDER — PANTOPRAZOLE SODIUM 20 MG PO TBEC: 20.0000 mg | DELAYED_RELEASE_TABLET | Freq: Every day | ORAL | Status: DC

## 2022-07-17 MED ORDER — TRAZODONE HCL 50 MG PO TABS: 25.0000 mg | ORAL_TABLET | Freq: Every evening | ORAL | Status: DC | PRN

## 2022-07-17 MED ORDER — PEG-KCL-NACL-NASULF-NA ASC-C 100 G PO SOLR
0.5000 | Freq: Once | ORAL | Status: AC
  Administered 2022-07-17: 100 g via ORAL
  Filled 2022-07-17: qty 1

## 2022-07-17 MED ORDER — ALBUTEROL SULFATE (2.5 MG/3ML) 0.083% IN NEBU: 3.0000 mL | INHALATION_SOLUTION | Freq: Four times a day (QID) | RESPIRATORY_TRACT | Status: DC | PRN

## 2022-07-17 MED ORDER — IRBESARTAN 300 MG PO TABS
300.0000 mg | ORAL_TABLET | Freq: Every day | ORAL | Status: DC
  Administered 2022-07-17 – 2022-07-19 (×3): 300 mg via ORAL
  Filled 2022-07-17 (×3): qty 1

## 2022-07-17 MED ORDER — HYDRALAZINE HCL 50 MG PO TABS
100.0000 mg | ORAL_TABLET | Freq: Three times a day (TID) | ORAL | Status: DC
  Administered 2022-07-17 – 2022-07-19 (×5): 100 mg via ORAL
  Filled 2022-07-17 (×6): qty 2

## 2022-07-17 MED ORDER — ATORVASTATIN CALCIUM 40 MG PO TABS
40.0000 mg | ORAL_TABLET | Freq: Every day | ORAL | Status: DC
  Administered 2022-07-17 – 2022-07-18 (×2): 40 mg via ORAL
  Filled 2022-07-17 (×3): qty 1

## 2022-07-17 MED ORDER — PANTOPRAZOLE SODIUM 20 MG PO TBEC
20.0000 mg | DELAYED_RELEASE_TABLET | Freq: Every day | ORAL | Status: DC
  Administered 2022-07-17: 20 mg via ORAL
  Filled 2022-07-17 (×2): qty 1

## 2022-07-17 MED ORDER — DIPHENOXYLATE-ATROPINE 2.5-0.025 MG PO TABS: 1.0000 | ORAL_TABLET | Freq: Two times a day (BID) | ORAL | Status: DC | PRN

## 2022-07-17 MED ORDER — CARVEDILOL 25 MG PO TABS
25.0000 mg | ORAL_TABLET | Freq: Two times a day (BID) | ORAL | Status: DC
  Administered 2022-07-17 – 2022-07-19 (×4): 25 mg via ORAL
  Filled 2022-07-17 (×2): qty 2
  Filled 2022-07-17 (×2): qty 1
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
## 2022-07-17 NOTE — Telephone Encounter (Signed)
[facility] called with STAT lab, Hgb 6.3 Given to Dr Duke Salvia for review

## 2022-07-17 NOTE — Telephone Encounter (Signed)
Crystal - [facility] calling to give critical lab result

## 2022-07-17 NOTE — Progress Notes (Signed)
NEW ADMISSION NOTE New Admission Note:   Arrival Method: Patient came from ED in stretcher but ambulated to bed in the room. Mental Orientation: alert and oriented x 4. Telemetry: N/A Assessment: Completed Skin: warm, dry and intact,  scab on left ankle noted. IV: R FA SL. Pain: Denies any pain. Tubes: N/A Safety Measures: Safety Fall Prevention Plan has been given, discussed and signed Admission: Completed [facility] Orientation: Patient has been orientated to the room, unit and staff.  Family: NOne.  Orders have been reviewed and implemented. Will continue to monitor the patient. Call light has been placed within reach and bed alarm has been activated.   Arvilla Meres, RN
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
## 2022-07-17 NOTE — Progress Notes (Signed)
Small scab on inner L ankle
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
## 2022-07-17 NOTE — Telephone Encounter (Signed)
Spoke with patient regarding low Hgb and advised to go to ED, stated she wouldn't be able to go today  Advised patient needed to go today to see what is causing this to be low, patient verbalized understanding and will reach out to daughter

## 2022-07-17 NOTE — ED Provider Notes (Signed)
[place] EMERGENCY DEPARTMENT AT [facility] Provider Note   CSN: [id number] Arrival date & time: 07/17/22  1157     History  Chief Complaint  Patient presents with   Abnormal Lab   HPI Norva Dohrman is a 72 y.o. female with CHF, hypertension, CKD and history of small bowel obstruction presenting for abnormal lab.  States she went to see her PCP yesterday for a routine check for her hypertension.  Blood work was drawn at that time.  Was called today and advised that her hemoglobin was 6.3.  Denies bloody stool or hematemesis.  Denies use of blood thinners.  States she has been very fatigued and short of breath for the last month.  Denies chest pain.  Denies nausea, vomiting and diarrhea.   Abnormal Lab      Home Medications Prior to Admission medications   Medication Sig Start Date End Date Taking? Authorizing Provider  albuterol (VENTOLIN HFA) 108 (90 Base) MCG/ACT inhaler INHALE 2 PUFFS BY MOUTH EVERY 4 HOURS AS NEEDED FOR WHEEZE OR FOR SHORTNESS OF BREATH 07/03/22   Philip Aspen, Limmie Patricia, MD  aspirin EC 81 MG tablet Take 1 tablet (81 mg total) by mouth daily. Swallow whole. 10/09/21   Alver Sorrow, NP  atorvastatin (LIPITOR) 40 MG tablet TAKE 1 TABLET BY MOUTH DAILY AT 6 PM. 07/16/22   Alver Sorrow, NP  carvedilol (COREG) 25 MG tablet Take 1 tablet (25 mg total) by mouth 2 (two) times daily. 07/16/22   Alver Sorrow, NP  diphenoxylate-atropine (LOMOTIL) 2.5-0.025 MG tablet Take 1 tablet by mouth 2 (two) times daily as needed for diarrhea or loose stools. 01/25/19   Philip Aspen, Limmie Patricia, MD  fluticasone [facility]) 50 MCG/ACT nasal spray SPRAY 2 SPRAYS INTO EACH NOSTRIL EVERY DAY 01/24/20   Philip Aspen, Limmie Patricia, MD  furosemide (LASIX) 20 MG tablet Take 1 tablet (20 mg total) by mouth daily. 07/16/22   Alver Sorrow, NP  hydrALAZINE (APRESOLINE) 100 MG tablet Take 1 tablet (100 mg total) by mouth 3 (three) times daily. 07/16/22 07/11/23  Alver Sorrow, NP  mirtazapine (REMERON) 15 MG tablet TAKE 1 TABLET BY MOUTH EVERYDAY AT BEDTIME 06/03/22   Philip Aspen, Limmie Patricia, MD  pantoprazole (PROTONIX) 20 MG tablet TAKE 1 TABLET BY MOUTH EVERY DAY 07/24/20   Philip Aspen, Limmie Patricia, MD  traZODone (DESYREL) 50 MG tablet TAKE 1/2 TO 1 TABLET BY MOUTH AT BEDTIME AS NEEDED FOR SLEEP 06/18/22   Philip Aspen, Limmie Patricia, MD  valsartan (DIOVAN) 320 MG tablet Take 1 tablet (320 mg total) by mouth daily. 07/16/22   Alver Sorrow, NP      Allergies    Morphine and codeine    Review of Systems   See HPI for pertinent positives   Physical Exam   Vitals:   07/17/22 1201 07/17/22 1448  BP: (!) 169/84 139/65  Pulse: 100 77  Resp: 17 18  Temp: 98.3 F (36.8 C) 98.3 F (36.8 C)  SpO2: 99% 100%    CONSTITUTIONAL:  well-appearing, NAD NEURO:  Alert and oriented x 3, CN 3-12 grossly intact EYES:  eyes equal and reactive ENT/NECK:  Supple, no stridor  CARDIO:  regular rate and rhythm, appears well-perfused  PULM:  No respiratory distress, CTAB Rectal exam: Externally no masses, lesions or rashes, hemorrhoids or fissures.  Internally rectal vault is smooth without mass, no obvious blood in the stool sample. GI/GU:  non-distended, soft, non tender MSK/SPINE:  No gross deformities, no edema, moves all extremities  SKIN:  no rash, atraumatic   *Additional and/or pertinent findings included in MDM below    ED Results / Procedures / Treatments   Labs (all labs ordered are listed, but only abnormal results are displayed) Labs Reviewed  COMPREHENSIVE METABOLIC PANEL - Abnormal; Notable for the following components:      Result Value   Sodium 133 (*)    CO2 20 (*)    Glucose, Bld 109 (*)    BUN 26 (*)    Creatinine, Ser 1.84 (*)    GFR, Estimated 29 (*)    All other components within normal limits  CBC - Abnormal; Notable for the following components:   RBC 2.42 (*)    Hemoglobin 6.1 (*)    HCT 19.9 (*)    MCH 25.2 (*)     All other components within normal limits  IRON AND TIBC  RETICULOCYTES  FERRITIN  POC OCCULT BLOOD, ED  TYPE AND SCREEN  PREPARE RBC (CROSSMATCH)    EKG None  Radiology No results found.  Procedures .Critical Care  Performed by: Gareth Eagle, PA-C Authorized by: Gareth Eagle, PA-C   Critical care provider statement:    Critical care time (minutes):  30   Critical care was necessary to treat or prevent imminent or life-threatening deterioration of the following conditions: Symptomatic anemia requiring blood transfusion.   Critical care was time spent personally by me on the following activities:  Development of treatment plan with patient or surrogate, discussions with consultants, evaluation of patient's response to treatment, examination of patient, ordering and review of laboratory studies, ordering and review of radiographic studies, ordering and performing treatments and interventions, pulse oximetry, re-evaluation of patient's condition and review of old charts     Medications Ordered in ED Medications  0.9 %  sodium chloride infusion (Manually program via Guardrails IV Fluids) (has no administration in time range)    ED Course/ Medical Decision Making/ A&P                             Medical Decision Making Amount and/or Complexity of Data Reviewed Labs: ordered.  Risk Prescription drug management.   Initial Impression and Ddx 72 year old well-appearing female presenting for abnormal lab.  Exam was unremarkable.  DDx includes symptomatic anemia, GI bleed, intra-abdominal infection.  Patient PMH that increases complexity of ED encounter:  CHF, hypertension, CKD and history of small bowel obstruction  Interpretation of Diagnostics - I independent reviewed and interpreted the labs as followed: Anemia, reduced GFR    Patient Reassessment and Ultimate Disposition/Management Labs and symptoms indicating symptomatic anemia.  Ordered 1 unit of PRBCs.  Low  suspicion for GI bleed given Hemoccult negative and denies bloody stools at home.  Intra-abdominal infection also unlikely given nontender abdomen.  Admitted to hospital service with Dr. Chipper Herb for anemia.  Patient management required discussion with the following services or consulting groups:  Hospitalist Service  Complexity of Problems Addressed Acute complicated illness or Injury  Additional Data Reviewed and Analyzed Further history obtained from: Past medical history and medications listed in the EMR and Prior ED visit notes  Patient Encounter Risk Assessment Consideration of hospitalization         Final Clinical Impression(s) / ED Diagnoses Final diagnoses:  Symptomatic anemia    Rx / DC Orders ED Discharge Orders     None  Gareth Eagle, PA-C 07/17/22 1457    Gerhard Munch, MD 07/17/22 [phone]
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
## 2022-07-17 NOTE — ED Triage Notes (Signed)
Patient from home stating PCP advised her to come to the ED because blood work drawn yesterday reflects Hgb of 6.3. Denies noticing any blood in her stool. Denies complaints other than feeling fatigued.

## 2022-07-17 NOTE — Progress Notes (Signed)
Upper and lower dentures

## 2022-07-18 ENCOUNTER — Encounter (HOSPITAL_COMMUNITY): Payer: Self-pay | Admitting: Internal Medicine

## 2022-07-18 ENCOUNTER — Observation Stay (HOSPITAL_COMMUNITY): Payer: Medicare Other | Admitting: Anesthesiology

## 2022-07-18 ENCOUNTER — Encounter (HOSPITAL_COMMUNITY)
Admission: EM | Disposition: A | Discharge status: Home / Self Care | Payer: Self-pay | Source: Home / Self Care | Attending: Internal Medicine

## 2022-07-18 ENCOUNTER — Observation Stay (HOSPITAL_BASED_OUTPATIENT_CLINIC_OR_DEPARTMENT_OTHER): Payer: Medicare Other | Admitting: Anesthesiology

## 2022-07-18 ENCOUNTER — Telehealth: Payer: Self-pay

## 2022-07-18 DIAGNOSIS — D126 Benign neoplasm of colon, unspecified: Secondary | ICD-10-CM | POA: Diagnosis not present

## 2022-07-18 DIAGNOSIS — K635 Polyp of colon: Secondary | ICD-10-CM | POA: Diagnosis not present

## 2022-07-18 DIAGNOSIS — K2900 Acute gastritis without bleeding: Secondary | ICD-10-CM

## 2022-07-18 DIAGNOSIS — K2970 Gastritis, unspecified, without bleeding: Secondary | ICD-10-CM

## 2022-07-18 DIAGNOSIS — K31819 Angiodysplasia of stomach and duodenum without bleeding: Secondary | ICD-10-CM | POA: Diagnosis not present

## 2022-07-18 DIAGNOSIS — N183 Chronic kidney disease, stage 3 unspecified: Secondary | ICD-10-CM

## 2022-07-18 DIAGNOSIS — Z79899 Other long term (current) drug therapy: Secondary | ICD-10-CM | POA: Diagnosis not present

## 2022-07-18 DIAGNOSIS — D124 Benign neoplasm of descending colon: Secondary | ICD-10-CM | POA: Diagnosis not present

## 2022-07-18 DIAGNOSIS — Z7982 Long term (current) use of aspirin: Secondary | ICD-10-CM | POA: Diagnosis not present

## 2022-07-18 DIAGNOSIS — G894 Chronic pain syndrome: Secondary | ICD-10-CM | POA: Diagnosis present

## 2022-07-18 DIAGNOSIS — D127 Benign neoplasm of rectosigmoid junction: Secondary | ICD-10-CM | POA: Diagnosis not present

## 2022-07-18 DIAGNOSIS — D6489 Other specified anemias: Secondary | ICD-10-CM | POA: Diagnosis not present

## 2022-07-18 DIAGNOSIS — D649 Anemia, unspecified: Secondary | ICD-10-CM

## 2022-07-18 DIAGNOSIS — F32A Depression, unspecified: Secondary | ICD-10-CM | POA: Diagnosis present

## 2022-07-18 DIAGNOSIS — F419 Anxiety disorder, unspecified: Secondary | ICD-10-CM | POA: Diagnosis present

## 2022-07-18 DIAGNOSIS — E785 Hyperlipidemia, unspecified: Secondary | ICD-10-CM | POA: Diagnosis present

## 2022-07-18 DIAGNOSIS — D123 Benign neoplasm of transverse colon: Secondary | ICD-10-CM

## 2022-07-18 DIAGNOSIS — K31811 Angiodysplasia of stomach and duodenum with bleeding: Secondary | ICD-10-CM | POA: Diagnosis present

## 2022-07-18 DIAGNOSIS — R634 Abnormal weight loss: Secondary | ICD-10-CM | POA: Diagnosis present

## 2022-07-18 DIAGNOSIS — I5042 Chronic combined systolic (congestive) and diastolic (congestive) heart failure: Secondary | ICD-10-CM

## 2022-07-18 DIAGNOSIS — R194 Change in bowel habit: Secondary | ICD-10-CM | POA: Diagnosis not present

## 2022-07-18 DIAGNOSIS — K5730 Diverticulosis of large intestine without perforation or abscess without bleeding: Secondary | ICD-10-CM | POA: Diagnosis present

## 2022-07-18 DIAGNOSIS — Z885 Allergy status to narcotic agent status: Secondary | ICD-10-CM | POA: Diagnosis not present

## 2022-07-18 DIAGNOSIS — I428 Other cardiomyopathies: Secondary | ICD-10-CM | POA: Diagnosis present

## 2022-07-18 DIAGNOSIS — D125 Benign neoplasm of sigmoid colon: Secondary | ICD-10-CM | POA: Diagnosis present

## 2022-07-18 DIAGNOSIS — Z87891 Personal history of nicotine dependence: Secondary | ICD-10-CM | POA: Diagnosis not present

## 2022-07-18 DIAGNOSIS — K5520 Angiodysplasia of colon without hemorrhage: Secondary | ICD-10-CM

## 2022-07-18 DIAGNOSIS — D62 Acute posthemorrhagic anemia: Secondary | ICD-10-CM | POA: Diagnosis present

## 2022-07-18 DIAGNOSIS — M47812 Spondylosis without myelopathy or radiculopathy, cervical region: Secondary | ICD-10-CM | POA: Diagnosis present

## 2022-07-18 DIAGNOSIS — D509 Iron deficiency anemia, unspecified: Secondary | ICD-10-CM | POA: Diagnosis not present

## 2022-07-18 DIAGNOSIS — I130 Hypertensive heart and chronic kidney disease with heart failure and stage 1 through stage 4 chronic kidney disease, or unspecified chronic kidney disease: Secondary | ICD-10-CM | POA: Diagnosis not present

## 2022-07-18 DIAGNOSIS — N1832 Chronic kidney disease, stage 3b: Secondary | ICD-10-CM | POA: Diagnosis present

## 2022-07-18 HISTORY — PX: COLONOSCOPY WITH PROPOFOL: SHX5780

## 2022-07-18 HISTORY — PX: BIOPSY: SHX5522

## 2022-07-18 HISTORY — PX: HEMOSTASIS CLIP PLACEMENT: SHX6857

## 2022-07-18 HISTORY — PX: HOT HEMOSTASIS: SHX5433

## 2022-07-18 HISTORY — PX: POLYPECTOMY: SHX5525

## 2022-07-18 HISTORY — PX: ESOPHAGOGASTRODUODENOSCOPY (EGD) WITH PROPOFOL: SHX5813

## 2022-07-18 LAB — BASIC METABOLIC PANEL WITH GFR
Anion gap: 12 (ref 5–15)
BUN: 21 mg/dL (ref 8–23)
CO2: 21 mmol/L — ABNORMAL LOW (ref 22–32)
Calcium: 9.2 mg/dL (ref 8.9–10.3)
Chloride: 107 mmol/L (ref 98–111)
Creatinine, Ser: 1.58 mg/dL — ABNORMAL HIGH (ref 0.44–1.00)
GFR, Estimated: 35 mL/min — ABNORMAL LOW
Glucose, Bld: 91 mg/dL (ref 70–99)
Potassium: 3.7 mmol/L (ref 3.5–5.1)
Sodium: 140 mmol/L (ref 135–145)

## 2022-07-18 LAB — BPAM RBC
Blood Product Expiration Date: 202407242359
Blood Product Expiration Date: 202407252359
ISSUE DATE / TIME: 202406271446
Unit Type and Rh: 5100

## 2022-07-18 LAB — CBC
HCT: 22.2 % — ABNORMAL LOW (ref 36.0–46.0)
Hemoglobin: 7 g/dL — ABNORMAL LOW (ref 12.0–15.0)
MCH: 25.3 pg — ABNORMAL LOW (ref 26.0–34.0)
MCHC: 31.5 g/dL (ref 30.0–36.0)
MCV: 80.1 fL (ref 80.0–100.0)
Platelets: 305 10*3/uL (ref 150–400)
RBC: 2.77 MIL/uL — ABNORMAL LOW (ref 3.87–5.11)
RDW: 16.9 % — ABNORMAL HIGH (ref 11.5–15.5)
WBC: 5.6 10*3/uL (ref 4.0–10.5)
nRBC: 0 % (ref 0.0–0.2)

## 2022-07-18 LAB — PREPARE RBC (CROSSMATCH)

## 2022-07-18 SURGERY — COLONOSCOPY WITH PROPOFOL
Anesthesia: Monitor Anesthesia Care

## 2022-07-18 MED ORDER — LIDOCAINE 2% (20 MG/ML) 5 ML SYRINGE
INTRAMUSCULAR | Status: DC | PRN
  Administered 2022-07-18: 60 mg via INTRAVENOUS

## 2022-07-18 MED ORDER — SODIUM CHLORIDE 0.9 % IV SOLN: INTRAVENOUS | Status: DC

## 2022-07-18 MED ORDER — PANTOPRAZOLE SODIUM 40 MG PO TBEC
40.0000 mg | DELAYED_RELEASE_TABLET | Freq: Every day | ORAL | Status: DC
  Administered 2022-07-19: 40 mg via ORAL
  Filled 2022-07-18: qty 1

## 2022-07-18 MED ORDER — PROPOFOL 10 MG/ML IV BOLUS
INTRAVENOUS | Status: DC | PRN
  Administered 2022-07-18: 80 mg via INTRAVENOUS

## 2022-07-18 MED ORDER — SODIUM CHLORIDE 0.9 % IV SOLN
250.0000 mg | Freq: Every day | INTRAVENOUS | Status: DC
  Administered 2022-07-18: 250 mg via INTRAVENOUS
  Filled 2022-07-18 (×2): qty 20

## 2022-07-18 MED ORDER — PROPOFOL 500 MG/50ML IV EMUL
INTRAVENOUS | Status: DC | PRN
  Administered 2022-07-18: 125 ug/kg/min via INTRAVENOUS

## 2022-07-18 MED ORDER — SODIUM CHLORIDE 0.9% IV SOLUTION: Freq: Once | INTRAVENOUS | Status: AC

## 2022-07-18 SURGICAL SUPPLY — 25 items

## 2022-07-18 NOTE — Progress Notes (Signed)
Pharmacy consulted to dose IV iron. Patient has received 2 transfusions thus far which will provide ~250mg  iron per transfusion. Aim for 1g load and goal hgb >/= 13.   Plan: Ferrlecit 250mg  IV x 2 doses   Rexford Maus, PharmD, BCPS 07/18/2022 3:06 PM

## 2022-07-18 NOTE — Anesthesia Preprocedure Evaluation (Addendum)
Anesthesia Evaluation  Patient identified by MRN, date of birth, ID band Patient awake    Reviewed: Allergy & Precautions, NPO status , Patient's Chart, lab work & pertinent test results, reviewed documented beta blocker date and time   Airway Mallampati: II  TM Distance: >3 FB Neck ROM: Full    Dental no notable dental hx.    Pulmonary Current Smoker   Pulmonary exam normal breath sounds clear to auscultation       Cardiovascular hypertension (139/72 preop), Pt. on medications and Pt. on home beta blockers +CHF (LVEF 35-40%, grade 2 diastolic dysfunction)  Normal cardiovascular exam+ Valvular Problems/Murmurs (mild MR, mild-mod TR) MR  Rhythm:Regular Rate:Normal  Echo 2021  1. Left ventricular ejection fraction, by estimation, is 35 to 40%. The  left ventricle has moderately decreased function. The left ventricle  demonstrates global hypokinesis. There is moderate left ventricular  hypertrophy. Left ventricular diastolic  parameters are consistent with Grade II diastolic dysfunction  (pseudonormalization). Elevated left atrial pressure.   2. Right ventricular systolic function is normal. The right ventricular  size is normal. There is moderately elevated pulmonary artery systolic  pressure.   3. Left atrial size was mildly dilated.   4. The mitral valve is normal in structure. Mild mitral valve  regurgitation.   5. Tricuspid valve regurgitation is mild to moderate.   6. The aortic valve is tricuspid. Aortic valve regurgitation is not  visualized. No aortic stenosis is present.   7. Compared to prior TTE 12/25/18, there is improvement in LV systolic  function     Neuro/Psych   Anxiety Depression       GI/Hepatic Neg liver ROS,GERD  Controlled and Medicated,,  Endo/Other  negative endocrine ROS    Renal/GU CRFRenal diseaseCr 1.58  negative genitourinary   Musculoskeletal  (+) Arthritis , Osteoarthritis,     Abdominal   Peds  Hematology  (+) Blood dyscrasia, anemia Hb 7, finishing 1 unit prbc now   Anesthesia Other Findings   Reproductive/Obstetrics negative OB ROS                              Anesthesia Physical Anesthesia Plan  ASA: 3  Anesthesia Plan: MAC   Post-op Pain Management:    Induction:   PONV Risk Score and Plan: 2 and Propofol infusion and TIVA  Airway Management Planned: Natural Airway and Simple Face Mask  Additional Equipment: None  Intra-op Plan:   Post-operative Plan:   Informed Consent: I have reviewed the patients History and Physical, chart, labs and discussed the procedure including the risks, benefits and alternatives for the proposed anesthesia with the patient or authorized representative who has indicated his/her understanding and acceptance.       Plan Discussed with: CRNA  Anesthesia Plan Comments:          Anesthesia Quick Evaluation

## 2022-07-18 NOTE — Op Note (Signed)
[facility] Patient Name: [name] Procedure Date : 07/18/2022 MRN: [id number] Attending MD: Beverley Fiedler , MD, [id number] Date of Birth: 04/30/1950 CSN: [id number] Age: 72 Admit Type: Inpatient Procedure:                Colonoscopy Indications:              Iron deficiency anemia Providers:                Carie Caddy. Rhea Belton, MD, Geralyn Corwin, RN, Beryle Beams, Technician Referring MD:             [facility] Medicines:                Monitored Anesthesia Care Complications:            No immediate complications. Estimated Blood Loss:     Estimated blood loss: none. Procedure:                Pre-Anesthesia Assessment:                           - Prior to the procedure, a History and Physical                            was performed, and patient medications and                            allergies were reviewed. The patient's tolerance of                            previous anesthesia was also reviewed. The risks                            and benefits of the procedure and the sedation                            options and risks were discussed with the patient.                            All questions were answered, and informed consent                            was obtained. Prior Anticoagulants: The patient has                            taken no anticoagulant or antiplatelet agents. ASA                            Grade Assessment: III - A patient with severe                            systemic disease. After reviewing the risks and  benefits, the patient was deemed in satisfactory                            condition to undergo the procedure.                           After obtaining informed consent, the colonoscope                            was passed under direct vision. Throughout the                            procedure, the patient's blood pressure, pulse, and                             oxygen saturations were monitored continuously. The                            [id number] ([id number]) Olympus peds colonoscope was                            introduced through the anus and advanced to the                            cecum, identified by appendiceal orifice and                            ileocecal valve. The colonoscopy was performed                            without difficulty. The patient tolerated the                            procedure well. The quality of the bowel                            preparation was good. The ileocecal valve,                            appendiceal orifice, and rectum were photographed. Scope In: 1:05:30 PM Scope Out: 1:18:16 PM Scope Withdrawal Time: 0 hours 10 minutes 11 seconds  Total Procedure Duration: 0 hours 12 minutes 46 seconds  Findings:      The digital rectal exam was normal.      Three sessile polyps were found in the recto-sigmoid colon, descending       colon and transverse colon. The polyps were 3 to 5 mm in size. These       polyps were removed with a cold snare. Resection and retrieval were       complete.      A few small-mouthed diverticula were found in the sigmoid colon and       ascending colon.      The retroflexed view of the distal rectum and anal verge was normal and       showed no anal or rectal abnormalities. Impression:               -  Three 3 to 5 mm polyps at the recto-sigmoid                            colon, in the descending colon and in the                            transverse colon, removed with a cold snare.                            Resected and retrieved.                           - Mild diverticulosis in the sigmoid colon and in                            the ascending colon.                           - The distal rectum and anal verge are normal on                            retroflexion view. Moderate Sedation:      N/A Recommendation:           - Return patient to hospital ward for  ongoing care.                           - Advance diet as tolerated.                           - Continue present medications.                           - Await pathology results.                           - See the other procedure note for documentation of                            additional recommendations.                           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 GI                            follow-up and if recurrent or persistent IDA after                            iron replacement then video capsule endoscopy is                            recommended).                           -  Anticipate discharge soon.                           - GI will sign off, call if questions. Procedure Code(s):        --- Professional ---                           [phone], Colonoscopy, flexible; with removal of                            tumor(s), polyp(s), or other lesion(s) by snare                            technique Diagnosis Code(s):        --- Professional ---                           D12.7, Benign neoplasm of rectosigmoid junction                           D12.4, Benign neoplasm of descending colon                           D12.3, Benign neoplasm of transverse colon (hepatic                            flexure or splenic flexure)                           D50.9, Iron deficiency anemia, unspecified                           K57.30, Diverticulosis of large intestine without                            perforation or abscess without bleeding CPT copyright 2022 [employer] Association. All rights reserved. The codes documented in this report are preliminary and upon coder review may  be revised to meet current compliance requirements. Beverley Fiedler, MD 07/18/2022 1:31:49 PM This report has been signed electronically. Number of Addenda: 0

## 2022-07-18 NOTE — Transfer of Care (Signed)
Immediate Anesthesia Transfer of Care Note  Patient: [name]  Procedure(s) Performed: COLONOSCOPY WITH PROPOFOL ESOPHAGOGASTRODUODENOSCOPY (EGD) WITH PROPOFOL HOT HEMOSTASIS (ARGON PLASMA COAGULATION/BICAP) BIOPSY HEMOSTASIS CLIP PLACEMENT POLYPECTOMY  Patient Location: PACU  Anesthesia Type:MAC  Level of Consciousness: awake, alert , and oriented  Airway & Oxygen Therapy: Patient Spontanous Breathing  Post-op Assessment: Report given to RN and Post -op Vital signs reviewed and stable  Post vital signs: Reviewed and stable  Last Vitals:  Vitals Value Taken Time  BP    Temp    Pulse    Resp    SpO2      Last Pain:  Vitals:   07/18/22 1226  TempSrc: Temporal  PainSc:          Complications: No notable events documented.

## 2022-07-18 NOTE — Hospital Course (Signed)
72 y.o. female with medical history significant of chronic combined HFrEF and HFpEF, nonischemic cardiomyopathy, HTN, HLD CKD stage IIIb, anxiety/depression, sent from cardiologist office for evaluation of symptomatic anemia.

## 2022-07-18 NOTE — Interval H&P Note (Signed)
History and Physical Interval Note: For EGD and colonoscopy today to evaluate significant iron deficiency anemia No complaints from the patient today feeling better after blood transfusions     Latest Ref Rng & Units 07/18/2022    3:42 AM 07/17/2022    9:26 PM 07/17/2022   12:05 PM  CBC  WBC 4.0 - 10.5 K/uL 5.6   4.3   Hemoglobin 12.0 - 15.0 g/dL 7.0  7.6  6.1   Hematocrit 36.0 - 46.0 % 22.2  23.5  19.9   Platelets 150 - 400 K/uL 305   372    CMP     Component Value Date/Time   NA 140 07/18/2022 0342   NA 134 07/16/2022 1125   K 3.7 07/18/2022 0342   CL 107 07/18/2022 0342   CO2 21 (L) 07/18/2022 0342   GLUCOSE 91 07/18/2022 0342   BUN 21 07/18/2022 0342   BUN 31 (H) 07/16/2022 1125   CREATININE 1.58 (H) 07/18/2022 0342   CALCIUM 9.2 07/18/2022 0342   PROT 7.2 07/17/2022 1205   PROT 7.2 07/16/2022 1125   ALBUMIN 4.0 07/17/2022 1205   ALBUMIN 4.5 07/16/2022 1125   AST 19 07/17/2022 1205   ALT 13 07/17/2022 1205   ALKPHOS 62 07/17/2022 1205   BILITOT 0.5 07/17/2022 1205   BILITOT <0.2 07/16/2022 1125   GFR 41.34 (L) 11/14/2020 1004   EGFR 25 (L) 07/16/2022 1125   GFRNONAA 35 (L) 07/18/2022 0342     07/18/2022 12:27 PM  [name]  has presented today for surgery, with the diagnosis of acute symptomatic anemia, change in bowel habits.  The various methods of treatment have been discussed with the patient and family. After consideration of risks, benefits and other options for treatment, the patient has consented to  Procedure(s): COLONOSCOPY WITH PROPOFOL (N/A) ESOPHAGOGASTRODUODENOSCOPY (EGD) WITH PROPOFOL (N/A) as a surgical intervention.  The patient's history has been reviewed, patient examined, no change in status, stable for surgery.  I have reviewed the patient's chart and labs.  Questions were answered to the patient's satisfaction.     Carie Caddy Erionna Strum

## 2022-07-18 NOTE — Op Note (Signed)
[facility] Patient Name: [name] Procedure Date : 07/18/2022 MRN: [id number] Attending MD: Beverley Fiedler , MD, [id number] Date of Birth: Nov 26, 1950 CSN: [id number] Age: 72 Admit Type: Inpatient Procedure:                Upper GI endoscopy Indications:              Iron deficiency anemia Providers:                Carie Caddy. Rhea Belton, MD, Geralyn Corwin, RN, Beryle Beams, Roque Cash CRNA Referring MD:             [facility] Medicines:                Monitored Anesthesia Care Complications:            No immediate complications. Estimated Blood Loss:     Estimated blood loss: none. Procedure:                Pre-Anesthesia Assessment:                           - Prior to the procedure, a History and Physical                            was performed, and patient medications and                            allergies were reviewed. The patient's tolerance of                            previous anesthesia was also reviewed. The risks                            and benefits of the procedure and the sedation                            options and risks were discussed with the patient.                            All questions were answered, and informed consent                            was obtained. Prior Anticoagulants: The patient has                            taken no anticoagulant or antiplatelet agents. ASA                            Grade Assessment: III - A patient with severe                            systemic disease. After reviewing the risks and  benefits, the patient was deemed in satisfactory                            condition to undergo the procedure.                           After obtaining informed consent, the endoscope was                            passed under direct vision. Throughout the                            procedure, the patient's blood pressure, pulse, and                             oxygen saturations were monitored continuously. The                            [id number]-[id number] ([id number]) Olympus endoscope was introduced                            through the mouth, and advanced to the second part                            of duodenum. The upper GI endoscopy was                            accomplished without difficulty. The patient                            tolerated the procedure well. Scope In: Scope Out: Findings:      The examined esophagus was normal.      Mild inflammation characterized by erosions and erythema was found in       the prepyloric region of the stomach. Biopsies were taken with a cold       forceps for histology and Helicobacter pylori testing.      Two 2 to 5 mm angioectasias with bleeding on contact were found in the       second portion of the duodenum. Fulguration to ablate the lesion by       argon plasma at 0.5 liters/minute and 20 watts was successful. To       prevent bleeding post-intervention, one hemostatic clip was successfully       placed (MR conditional). Clip manufacturer: [employer]. There was       no bleeding at the end of the maneuver. Impression:               - Normal esophagus.                           - Erosive gastritis in pre-pyloric stomach. No                            active bleeding. Biopsied to exclude H. Pylori.                           -  Two angioectasias in the duodenum. Treated with                            argon plasma coagulation (APC). Clip (MR                            conditional) was placed x 1 (on larger                            post-ablation slot). Clip manufacturer: [employer]. Moderate Sedation:      N/A Recommendation:           - Return patient to hospital ward for ongoing care.                           - Advance diet as tolerated.                           - Continue present medications. IV iron replacement                             while here.                           - Daily PPI.                           - Await pathology results.                           - See the other procedure note for documentation of                            additional recommendations. Procedure Code(s):        --- Professional ---                           43255, 59, Esophagogastroduodenoscopy, flexible,                            transoral; with control of bleeding, any method                           43239, Esophagogastroduodenoscopy, flexible,                            transoral; with biopsy, single or multiple Diagnosis Code(s):        --- Professional ---                           K29.70, Gastritis, unspecified, without bleeding                           K31.819, Angiodysplasia of stomach and duodenum  without bleeding                           D50.9, Iron deficiency anemia, unspecified CPT copyright 2022 American Medical Association. All rights reserved. The codes documented in this report are preliminary and upon coder review may  be revised to meet current compliance requirements. Beverley Fiedler, MD 07/18/2022 1:02:26 PM This report has been signed electronically. Number of Addenda: 0

## 2022-07-18 NOTE — Anesthesia Postprocedure Evaluation (Signed)
Anesthesia Post Note  Patient: Braleigh Bornemann  Procedure(s) Performed: COLONOSCOPY WITH PROPOFOL ESOPHAGOGASTRODUODENOSCOPY (EGD) WITH PROPOFOL HOT HEMOSTASIS (ARGON PLASMA COAGULATION/BICAP) BIOPSY HEMOSTASIS CLIP PLACEMENT POLYPECTOMY     Patient location during evaluation: PACU Anesthesia Type: MAC Level of consciousness: awake and alert Pain management: pain level controlled Vital Signs Assessment: post-procedure vital signs reviewed and stable Respiratory status: spontaneous breathing, nonlabored ventilation and respiratory function stable Cardiovascular status: blood pressure returned to baseline and stable Postop Assessment: no apparent nausea or vomiting Anesthetic complications: no   No notable events documented.  Last Vitals:  Vitals:   07/18/22 1340 07/18/22 1405  BP: 129/81 (!) 166/77  Pulse: 68 66  Resp: 17 18  Temp:  (!) 36.4 C  SpO2: 100% 100%    Last Pain:  Vitals:   07/18/22 1340  TempSrc:   PainSc: 0-No pain                 Lannie Fields

## 2022-07-18 NOTE — Care Management Obs Status (Signed)
MEDICARE OBSERVATION STATUS NOTIFICATION   Patient Details  Name: [name] MRN: [id number] Date of Birth: 1950/10/18   Medicare Observation Status Notification Given:  Yes    Tom-Johnson, Hershal Coria, RN 07/18/2022, 4:34 PM

## 2022-07-18 NOTE — Telephone Encounter (Signed)
-----   Message from Beverley Fiedler, MD sent at 07/18/2022  1:36 PM EDT ----- GI followup with stark or app in 1 month IDA, small bowel angioectasias  Thanks JMP

## 2022-07-18 NOTE — Progress Notes (Signed)
  Progress Note   Patient: [name] ZOX:[id number] DOB: 01/26/1950 DOA: 07/17/2022     0 DOS: the patient was seen and examined on 07/18/2022   Brief hospital course: 72 y.o. female with medical history significant of chronic combined HFrEF and HFpEF, nonischemic cardiomyopathy, HTN, HLD CKD stage IIIb, anxiety/depression, sent from cardiologist office for evaluation of symptomatic anemia.   Assessment and Plan: Symptomatic anemia, normocytic -Presenting hgb 6.3, s/p 2 units PRBC's -Guaiac test negative in the ED -GI consulted. Pt now s/p EGD and colon 6/28 with finding of erosive gastritis in pre-pyloric stomach and two angioectasias in the duodenum treated with APC, 3 polyps in colon -Recs for IV iron replacement -recheck cbc in AM   Unintentional weight loss -encourage PO intake as tolerated -F/u on pathology, can be f/u as outpatient   Chronic HFrEF and HFpEF -Euvolemic -cont Coreg, hydralazine losartan and Lasix -recheck bmet in AM   CKD stage IIIb -Euvolemic and creatinine level stable, continue losartan -recheck bmet in AM   Anxiety/depression -Stable, continue trazodone   Subjective: Seen prior to endoscopy. Denies abd pain or sob  Physical Exam: Vitals:   07/18/22 1330 07/18/22 1340 07/18/22 1405 07/18/22 1625  BP: 132/74 129/81 (!) 166/77 (!) 124/95  Pulse: 75 68 66 73  Resp: (!) 26 17 18 19   Temp:   (!) 97.5 F (36.4 C) 98.5 F (36.9 C)  TempSrc:    Oral  SpO2: 100% 100% 100% 100%  Weight:      Height:       General exam: Awake, laying in bed, in nad Respiratory system: Normal respiratory effort, no wheezing Cardiovascular system: regular rate, s1, s2 Gastrointestinal system: Soft, nondistended, positive BS Central nervous system: CN2-12 grossly intact, strength intact Extremities: Perfused, no clubbing Skin: Normal skin turgor, no notable skin lesions seen Psychiatry: Mood normal // no visual hallucinations   Data Reviewed:  Labs reviewed: Na  140, K 3.7, Cr 1.58, Hgb 7.0  Family Communication: Pt in room, family not at bedside  Disposition: Status is: Observation The patient will require care spanning > 2 midnights and should be moved to inpatient because: Severity of illness  Planned Discharge Destination: Home    Author: Rickey Barbara, MD 07/18/2022 4:53 PM  For on call review [email or web address].

## 2022-07-18 NOTE — Telephone Encounter (Signed)
Pt scheduled to see Quentin Mulling PA 08/15/22 at 11:30am.

## 2022-07-19 DIAGNOSIS — D649 Anemia, unspecified: Secondary | ICD-10-CM | POA: Diagnosis not present

## 2022-07-19 LAB — CBC
HCT: 26.8 % — ABNORMAL LOW (ref 36.0–46.0)
Hemoglobin: 8.6 g/dL — ABNORMAL LOW (ref 12.0–15.0)
MCH: 26.1 pg (ref 26.0–34.0)
MCHC: 32.1 g/dL (ref 30.0–36.0)
MCV: 81.2 fL (ref 80.0–100.0)
Platelets: 290 10*3/uL (ref 150–400)
RBC: 3.3 MIL/uL — ABNORMAL LOW (ref 3.87–5.11)
RDW: 16.4 % — ABNORMAL HIGH (ref 11.5–15.5)
WBC: 8.3 10*3/uL (ref 4.0–10.5)
nRBC: 0 % (ref 0.0–0.2)

## 2022-07-19 LAB — TYPE AND SCREEN
ABO/RH(D): O POS
Antibody Screen: NEGATIVE
Unit division: 0
Unit division: 0

## 2022-07-19 LAB — COMPREHENSIVE METABOLIC PANEL
ALT: 14 U/L (ref 0–44)
AST: 21 U/L (ref 15–41)
Albumin: 3.6 g/dL (ref 3.5–5.0)
Alkaline Phosphatase: 62 U/L (ref 38–126)
Anion gap: 9 (ref 5–15)
BUN: 19 mg/dL (ref 8–23)
CO2: 17 mmol/L — ABNORMAL LOW (ref 22–32)
Calcium: 9.1 mg/dL (ref 8.9–10.3)
Chloride: 112 mmol/L — ABNORMAL HIGH (ref 98–111)
Creatinine, Ser: 1.69 mg/dL — ABNORMAL HIGH (ref 0.44–1.00)
GFR, Estimated: 32 mL/min — ABNORMAL LOW (ref >60)
Glucose, Bld: 90 mg/dL (ref 70–99)
Potassium: 3.6 mmol/L (ref 3.5–5.1)
Sodium: 138 mmol/L (ref 135–145)
Total Bilirubin: 0.8 mg/dL (ref 0.3–1.2)
Total Protein: 6.5 g/dL (ref 6.5–8.1)

## 2022-07-19 LAB — BPAM RBC: ISSUE DATE / TIME: 202406281017

## 2022-07-19 MED ORDER — POLYETHYLENE GLYCOL 3350 17 G PO PACK: 17.0000 g | PACK | Freq: Every day | ORAL | 0 refills | Status: AC | PRN

## 2022-07-19 MED ORDER — FERROUS SULFATE 325 (65 FE) MG PO TABS: 325.0000 mg | ORAL_TABLET | Freq: Every day | ORAL | 0 refills | Status: AC

## 2022-07-19 MED ORDER — DOCUSATE SODIUM 100 MG PO CAPS: 100.0000 mg | ORAL_CAPSULE | Freq: Two times a day (BID) | ORAL | 0 refills | Status: AC

## 2022-07-19 NOTE — Discharge Summary (Signed)
Physician Discharge Summary   Patient: [name] MRN: [id number] DOB: [birth year]  Admit date:     07/17/2022  Discharge date: 07/19/22  Discharge Physician: Rickey Barbara   PCP: Philip Aspen, Limmie Patricia, MD   Recommendations at discharge:    Follow up with PCP in 1-2 weeks Recommend bp check within one week, continue to titrate BP meds as needed Follow up on pathology result, obtained 6/28  Discharge Diagnoses: Principal Problem:   Symptomatic anemia Active Problems:   Chronic combined systolic and diastolic heart failure (HCC)   CKD (chronic kidney disease) stage 3, GFR 30-59 ml/min (HCC)   Iron deficiency anemia   Angiodysplasia of small intestine   Benign neoplasm of transverse colon   Benign neoplasm of descending colon   Benign neoplasm of rectosigmoid junction   Acute gastritis without hemorrhage   Acute blood loss anemia  Resolved Problems:   * No resolved hospital problems. *  Hospital Course: 72 y.o. female with medical history significant of chronic combined HFrEF and HFpEF, nonischemic cardiomyopathy, HTN, HLD CKD stage IIIb, anxiety/depression, sent from cardiologist office for evaluation of symptomatic anemia.   Assessment and Plan: Symptomatic anemia, normocytic -Presenting hgb 6.3, s/p 2 units PRBC's -Guaiac test negative in the ED -GI consulted. Pt now s/p EGD and colon 6/28 with finding of erosive gastritis in pre-pyloric stomach and two angioectasias in the duodenum treated with APC, 3 polyps in colon -Recs for IV iron replacement, to continue iron supplementation   Unintentional weight loss -encourage PO intake as tolerated -F/u on pathology, can be f/u as outpatient   Chronic HFrEF and HFpEF -Euvolemic -cont Coreg, hydralazine losartan and Lasix   CKD stage IIIb -Euvolemic and creatinine level stable, continue losartan   Anxiety/depression -Stable, continue trazodone  HTN -Largely controled, however at times poorly  controlled -Suspect made worse secondary to excitement and anxiety -Recommend f/u for bp management as outpatient    Consultants: GI Procedures performed: EGD, colon Disposition: Home Diet recommendation:  Cardiac diet DISCHARGE MEDICATION: Allergies as of 07/19/2022       Reactions   Morphine And Codeine Other (See Comments)   Headache        Medication List     TAKE these medications    albuterol 108 (90 Base) MCG/ACT inhaler Commonly known as: VENTOLIN HFA INHALE 2 PUFFS BY MOUTH EVERY 4 HOURS AS NEEDED FOR WHEEZE OR FOR SHORTNESS OF BREATH What changed: See the new instructions.   aspirin EC 81 MG tablet Take 1 tablet (81 mg total) by mouth daily. Swallow whole.   atorvastatin 40 MG tablet Commonly known as: LIPITOR TAKE 1 TABLET BY MOUTH DAILY AT 6 PM. What changed:  how much to take how to take this when to take this additional instructions   carvedilol 25 MG tablet Commonly known as: COREG Take 1 tablet (25 mg total) by mouth 2 (two) times daily.   diphenoxylate-atropine 2.5-0.025 MG tablet Commonly known as: Lomotil Take 1 tablet by mouth 2 (two) times daily as needed for diarrhea or loose stools.   docusate sodium 100 MG capsule Commonly known as: Colace Take 1 capsule (100 mg total) by mouth 2 (two) times daily.   ferrous sulfate 325 (65 FE) MG tablet Take 1 tablet (325 mg total) by mouth daily with breakfast. Start taking on: July 20, 2022   fluticasone 50 MCG/ACT nasal spray Commonly known as: FLONASE SPRAY 2 SPRAYS INTO EACH NOSTRIL EVERY DAY   furosemide 20 MG tablet Commonly known as: LASIX  Take 1 tablet (20 mg total) by mouth daily.   hydrALAZINE 100 MG tablet Commonly known as: APRESOLINE Take 1 tablet (100 mg total) by mouth 3 (three) times daily.   mirtazapine 15 MG tablet Commonly known as: REMERON TAKE 1 TABLET BY MOUTH EVERYDAY AT BEDTIME   pantoprazole 20 MG tablet Commonly known as: PROTONIX TAKE 1 TABLET BY MOUTH  EVERY DAY   polyethylene glycol 17 g packet Commonly known as: MiraLax Take 17 g by mouth daily as needed for moderate constipation.   traZODone 50 MG tablet Commonly known as: DESYREL TAKE 1/2 TO 1 TABLET BY MOUTH AT BEDTIME AS NEEDED FOR SLEEP What changed: reasons to take this   valsartan 320 MG tablet Commonly known as: DIOVAN Take 1 tablet (320 mg total) by mouth daily.        Follow-up Information     Philip Aspen, Limmie Patricia, MD Follow up in 2 week(s).   Specialty: Internal Medicine Why: Hospital follow up Contact information: [address] [place] [facility] [place] [address] [phone]                Discharge Exam: Ceasar Mons Weights   07/17/22 1647 07/18/22 0506  Weight: 58.3 kg 57.9 kg   General exam: Awake, laying in bed, in nad Respiratory system: Normal respiratory effort, no wheezing Cardiovascular system: regular rate, s1, s2 Gastrointestinal system: Soft, nondistended, positive BS Central nervous system: CN2-12 grossly intact, strength intact Extremities: Perfused, no clubbing Skin: Normal skin turgor, no notable skin lesions seen Psychiatry: Mood normal // no visual hallucinations   Condition at discharge: fair  The results of significant diagnostics from this hospitalization (including imaging, microbiology, ancillary and laboratory) are listed below for reference.   Imaging Studies: No results found.  Microbiology: Results for orders placed or performed during the hospital encounter of 03/18/22  Resp panel by RT-PCR (RSV, Flu A&B, Covid) Anterior Nasal Swab     Status: None   Collection Time: 03/18/22  1:30 PM   Specimen: Anterior Nasal Swab  Result Value Ref Range Status   SARS Coronavirus 2 by RT PCR NEGATIVE NEGATIVE Final    Comment: (NOTE) SARS-CoV-2 target nucleic acids are NOT DETECTED.  The SARS-CoV-2 RNA is generally detectable in upper respiratory specimens during the acute phase of infection. The lowest concentration of  SARS-CoV-2 viral copies this assay can detect is 138 copies/mL. A negative result does not preclude SARS-Cov-2 infection and should not be used as the sole basis for treatment or other patient management decisions. A negative result may occur with  improper specimen collection/handling, submission of specimen other than nasopharyngeal swab, presence of viral mutation(s) within the areas targeted by this assay, and inadequate number of viral copies(<138 copies/mL). A negative result must be combined with clinical observations, patient history, and epidemiological information. The expected result is Negative.  Fact Sheet for Patients:  BloggerCourse.com  Fact Sheet for Healthcare Providers:  SeriousBroker.it  This test is no t yet approved or cleared by the [place] FDA and  has been authorized for detection and/or diagnosis of SARS-CoV-2 by FDA under an Emergency Use Authorization (EUA). This EUA will remain  in effect (meaning this test can be used) for the duration of the COVID-19 declaration under Section 564(b)(1) of the Act, 21 U.S.C.section 360bbb-3(b)(1), unless the authorization is terminated  or revoked sooner.       Influenza A by PCR NEGATIVE NEGATIVE Final   Influenza B by PCR NEGATIVE NEGATIVE Final    Comment: (NOTE) The  Xpert Xpress SARS-CoV-2/FLU/RSV plus assay is intended as an aid in the diagnosis of influenza from Nasopharyngeal swab specimens and should not be used as a sole basis for treatment. Nasal washings and aspirates are unacceptable for Xpert Xpress SARS-CoV-2/FLU/RSV testing.  Fact Sheet for Patients: BloggerCourse.com  Fact Sheet for Healthcare Providers: SeriousBroker.it  This test is not yet approved or cleared by the [place] FDA and has been authorized for detection and/or diagnosis of SARS-CoV-2 by FDA under an Emergency Use  Authorization (EUA). This EUA will remain in effect (meaning this test can be used) for the duration of the COVID-19 declaration under Section 564(b)(1) of the Act, 21 U.S.C. section 360bbb-3(b)(1), unless the authorization is terminated or revoked.     Resp Syncytial Virus by PCR NEGATIVE NEGATIVE Final    Comment: (NOTE) Fact Sheet for Patients: BloggerCourse.com  Fact Sheet for Healthcare Providers: SeriousBroker.it  This test is not yet approved or cleared by the [place] FDA and has been authorized for detection and/or diagnosis of SARS-CoV-2 by FDA under an Emergency Use Authorization (EUA). This EUA will remain in effect (meaning this test can be used) for the duration of the COVID-19 declaration under Section 564(b)(1) of the Act, 21 U.S.C. section 360bbb-3(b)(1), unless the authorization is terminated or revoked.  Performed at [facility], [address] [place], [place] [address]     Labs: CBC: Recent Labs  Lab 07/16/22 1125 07/17/22 1205 07/17/22 2126 07/18/22 0342 07/19/22 0143  WBC 7.1 4.3  --  5.6 8.3  HGB 6.3* 6.1* 7.6* 7.0* 8.6*  HCT 20.5* 19.9* 23.5* 22.2* 26.8*  MCV 83 82.2  --  80.1 81.2  PLT 381 372  --  305 290   Basic Metabolic Panel: Recent Labs  Lab 07/16/22 1125 07/17/22 1205 07/18/22 0342 07/19/22 0143  NA 134 133* 140 138  K 4.0 3.8 3.7 3.6  CL 99 103 107 112*  CO2 18* 20* 21* 17*  GLUCOSE 105* 109* 91 90  BUN 31* 26* 21 19  CREATININE 2.06* 1.84* 1.58* 1.69*  CALCIUM 9.5 9.1 9.2 9.1   Liver Function Tests: Recent Labs  Lab 07/16/22 1125 07/17/22 1205 07/19/22 0143  AST 16 19 21   ALT 9 13 14   ALKPHOS 83 62 62  BILITOT <0.2 0.5 0.8  PROT 7.2 7.2 6.5  ALBUMIN 4.5 4.0 3.6   CBG: No results for input(s): "GLUCAP" in the last 168 hours.  Discharge time spent: less than 30 minutes.  Signed: Rickey Barbara, MD [facility] 07/19/2022

## 2022-07-19 NOTE — TOC Transition Note (Signed)
Transition of Care [facility]) - CM/SW Discharge Note   Patient Details  Name: [name] MRN: [id number] Date of Birth: 1950-03-30  [facility] CM/SW Contact:  Tom-Johnson, Hershal Coria, RN Phone Number: 07/19/2022, 4:23 PM   Clinical Narrative:     Patient is scheduled for discharge today.  Patient sent from her Cardiologist office with Hgb of 6.3. Admitted for Symptomatic Anemia. 2U PRBC given this admit. HGB today at 8.6. EGD and Colonoscopy showed Erosive Gastritis. GI followed, treated with IV Iron and will continue with Iron supplement at home.   Readmission Risk Assessment done. Hospital f/u and discharge instructions on AVS.  No TOC needs or recommendations noted. Husband, [name] to transport at discharge.  No further TOC needs noted.      Final next level of care: Home/Self Care Barriers to Discharge: Barriers Resolved   Patient Goals and CMS Choice CMS Medicare.gov Compare Post Acute Care list provided to:: Patient Choice offered to / list presented to : NA  Discharge Placement                  Patient to be transferred to facility by: Husband Name of family member notified: [name]    Discharge Plan and Services Additional resources added to the After Visit Summary for                  DME Arranged: N/A DME Agency: NA       HH Arranged: NA HH Agency: NA        Social Determinants of Health (SDOH) Interventions SDOH Screenings   Food Insecurity: No Food Insecurity (07/17/2022)  Housing: Low Risk  (07/17/2022)  Transportation Needs: No Transportation Needs (07/17/2022)  Utilities: Not At Risk (07/17/2022)  Alcohol Screen: Low Risk  (04/06/2020)  Depression (PHQ2-9): Medium Risk (11/14/2020)  Physical Activity: Inactive (04/06/2020)  Stress: Stress Concern Present (04/06/2020)  Tobacco Use: High Risk (07/18/2022)     Readmission Risk Interventions    07/19/2022    4:23 PM  Readmission Risk Prevention Plan  Post Dischage Appt  Complete  Medication Screening Complete  Transportation Screening Complete

## 2022-07-21 ENCOUNTER — Encounter (HOSPITAL_COMMUNITY): Payer: Self-pay | Admitting: Internal Medicine

## 2022-07-21 ENCOUNTER — Telehealth: Payer: Self-pay

## 2022-07-21 ENCOUNTER — Telehealth: Payer: Self-pay | Admitting: Family

## 2022-07-21 LAB — SURGICAL PATHOLOGY

## 2022-07-21 NOTE — Transitions of Care (Post Inpatient/ED Visit) (Signed)
07/21/2022  Name: [name] MRN: [id number] DOB: [birth year]  Today's TOC FU Call Status: Today's TOC FU Call Status:: Successful TOC FU Call Competed TOC FU Call Complete Date: 07/21/22  Transition Care Management Follow-up Telephone Call Date of Discharge: 06/18/22 Discharge Facility: [facility] Type of Discharge: Inpatient Admission Primary Inpatient Discharge Diagnosis:: "symptomatic anemia" How have you been since you were released from the hospital?: Better (Pt pleased to report how well she is doing and very thankful for the medical care she received.) Any questions or concerns?: No  Items Reviewed: Did you receive and understand the discharge instructions provided?: Yes Medications obtained,verified, and reconciled?: Yes (Medications Reviewed) Any new allergies since your discharge?: No Dietary orders reviewed?: Yes Type of Diet Ordered:: low salt/heart healthy Do you have support at home?: Yes People in Home: spouse Name of Support/Comfort Primary Source: [name]  Medications Reviewed Today: Medications Reviewed Today     Reviewed by Charlyn Minerva, RN (Registered Nurse) on 07/21/22 at 1320  Med List Status: <None>   Medication Order Taking? Sig Documenting Provider Last Dose Status Informant  albuterol (VENTOLIN HFA) 108 (90 Base) MCG/ACT inhaler [id number] Yes INHALE 2 PUFFS BY MOUTH EVERY 4 HOURS AS NEEDED FOR WHEEZE OR FOR SHORTNESS OF BREATH  Patient taking differently: Inhale 2 puffs into the lungs every 4 (four) hours as needed for wheezing or shortness of breath.   Philip Aspen, Limmie Patricia, MD Taking Active Self  aspirin EC 81 MG tablet [id number] Yes Take 1 tablet (81 mg total) by mouth daily. Swallow whole. Alver Sorrow, NP Taking Active Self  atorvastatin (LIPITOR) 40 MG tablet [id number] Yes TAKE 1 TABLET BY MOUTH DAILY AT 6 PM.  Patient taking differently: Take 40 mg by mouth daily.   Alver Sorrow, NP Taking Active Self   carvedilol (COREG) 25 MG tablet [id number] Yes Take 1 tablet (25 mg total) by mouth 2 (two) times daily. Alver Sorrow, NP Taking Active Self  diphenoxylate-atropine (LOMOTIL) 2.5-0.025 MG tablet [id number]  Take 1 tablet by mouth 2 (two) times daily as needed for diarrhea or loose stools.  Patient not taking: Reported on 07/17/2022   Philip Aspen, Limmie Patricia, MD  Active Self  docusate sodium (COLACE) 100 MG capsule [id number] Yes Take 1 capsule (100 mg total) by mouth 2 (two) times daily. Jerald Kief, MD Taking Active   ferrous sulfate 325 (65 FE) MG tablet [id number] Yes Take 1 tablet (325 mg total) by mouth daily with breakfast. Jerald Kief, MD Taking Active   fluticasone [facility]) 50 MCG/ACT nasal spray [id number]  SPRAY 2 SPRAYS INTO EACH NOSTRIL EVERY DAY  Patient not taking: Reported on 07/17/2022   Philip Aspen, Limmie Patricia, MD  Active Self  furosemide (LASIX) 20 MG tablet [id number] Yes Take 1 tablet (20 mg total) by mouth daily. Alver Sorrow, NP Taking Active Self  hydrALAZINE (APRESOLINE) 100 MG tablet [id number] Yes Take 1 tablet (100 mg total) by mouth 3 (three) times daily. Alver Sorrow, NP Taking Active Self  mirtazapine (REMERON) 15 MG tablet [id number] No TAKE 1 TABLET BY MOUTH EVERYDAY AT BEDTIME  Patient not taking: Reported on 07/17/2022   Philip Aspen, Limmie Patricia, MD Not Taking Active Self  pantoprazole (PROTONIX) 20 MG tablet [id number] Yes TAKE 1 TABLET BY MOUTH EVERY DAY Philip Aspen, Limmie Patricia, MD Taking Active Self  polyethylene glycol (MIRALAX) 17 g packet [id number] Yes Take 17 g by mouth daily as needed for moderate constipation. Rickey Barbara  K, MD Taking Active   traZODone (DESYREL) 50 MG tablet [id number] No TAKE 1/2 TO 1 TABLET BY MOUTH AT BEDTIME AS NEEDED FOR SLEEP  Patient not taking: Reported on 07/21/2022   Philip Aspen, Limmie Patricia, MD Not Taking Active Self  valsartan (DIOVAN) 320 MG tablet [id number] Yes Take 1 tablet (320 mg total) by mouth  daily. Alver Sorrow, NP Taking Active Self            Home Care and Equipment/Supplies: Were Home Health Services Ordered?: NA Any new equipment or medical supplies ordered?: NA  Functional Questionnaire: Do you need assistance with bathing/showering or dressing?: No Do you need assistance with meal preparation?: No Do you need assistance with eating?: No Do you have difficulty maintaining continence: No Do you need assistance with getting out of bed/getting out of a chair/moving?: No Do you have difficulty managing or taking your medications?: No  Follow up appointments reviewed: PCP Follow-up appointment confirmed?: Yes Date of PCP follow-up appointment?: 07/31/22 Follow-up Provider: Dr. Philip Aspen Specialist [facility] Follow-up appointment confirmed?: Yes Date of Specialist follow-up appointment?: 08/15/22 Follow-Up Specialty Provider:: Hazeline Junker Do you need transportation to your follow-up appointment?: No Do you understand care options if your condition(s) worsen?: Yes-patient verbalized understanding  SDOH Interventions Today    Flowsheet Row Most Recent Value  SDOH Interventions   Food Insecurity Interventions Intervention Not Indicated  Transportation Interventions Intervention Not Indicated      TOC Interventions Today    Flowsheet Row Most Recent Value  TOC Interventions   TOC Interventions Discussed/Reviewed TOC Interventions Discussed, Arranged PCP follow up less than 12 days/Care Guide scheduled      Interventions Today    Flowsheet Row Most Recent Value  Chronic Disease   Chronic disease during today's visit Hypertension (HTN)  General Interventions   General Interventions Discussed/Reviewed General Interventions Discussed, Doctor Visits, Durable Medical Equipment (DME)  Doctor Visits Discussed/Reviewed Doctor Visits Discussed, Specialist, PCP  Durable Medical Equipment (DME) BP Cuff  [pt states she just got new machine and will  begin checking BP-advised to record BP readings and take log to MD appts]  PCP/Specialist Visits Compliance with follow-up visit  Education Interventions   Education Provided Provided Education  Provided Verbal Education On Nutrition, When to see the doctor, Medication, Other  [bowel mgmt]  Nutrition Interventions   Nutrition Discussed/Reviewed Nutrition Discussed, Adding fruits and vegetables, Decreasing salt, Increasing proteins, Fluid intake  Pharmacy Interventions   Pharmacy Dicussed/Reviewed Pharmacy Topics Discussed, Medications and their functions  Safety Interventions   Safety Discussed/Reviewed Safety Discussed       Alessandra Grout [facility] Care Management Care Management Community Coordinator Direct Phone: [phone] Toll Free: [phone] Fax: [phone]
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
## 2022-07-22 ENCOUNTER — Encounter: Payer: Self-pay | Admitting: Internal Medicine

## 2022-07-31 ENCOUNTER — Encounter: Payer: Self-pay | Admitting: Internal Medicine

## 2022-07-31 ENCOUNTER — Ambulatory Visit (INDEPENDENT_AMBULATORY_CARE_PROVIDER_SITE_OTHER): Payer: Medicare Other | Admitting: Internal Medicine

## 2022-07-31 VITALS — BP 130/70 | HR 90 | Temp 97.7°F | Wt 128.3 lb

## 2022-07-31 DIAGNOSIS — Z09 Encounter for follow-up examination after completed treatment for conditions other than malignant neoplasm: Secondary | ICD-10-CM

## 2022-07-31 DIAGNOSIS — I10 Essential (primary) hypertension: Secondary | ICD-10-CM

## 2022-07-31 DIAGNOSIS — D649 Anemia, unspecified: Secondary | ICD-10-CM

## 2022-07-31 LAB — CBC WITH DIFFERENTIAL/PLATELET
Basophils Absolute: 0.1 10*3/uL (ref 0.0–0.1)
Basophils Relative: 0.9 % (ref 0.0–3.0)
Eosinophils Absolute: 0.1 10*3/uL (ref 0.0–0.7)
Eosinophils Relative: 2.4 % (ref 0.0–5.0)
HCT: 26.8 % — ABNORMAL LOW (ref 36.0–46.0)
Hemoglobin: 8.6 g/dL — ABNORMAL LOW (ref 12.0–15.0)
Lymphocytes Relative: 26.4 % (ref 12.0–46.0)
Lymphs Abs: 1.7 10*3/uL (ref 0.7–4.0)
MCHC: 32 g/dL (ref 30.0–36.0)
MCV: 84.2 fl (ref 78.0–100.0)
Monocytes Absolute: 0.7 10*3/uL (ref 0.1–1.0)
Monocytes Relative: 11.3 % (ref 3.0–12.0)
Neutro Abs: 3.7 10*3/uL (ref 1.4–7.7)
Neutrophils Relative %: 59 % (ref 43.0–77.0)
Platelets: 343 10*3/uL (ref 150.0–400.0)
RBC: 3.19 Mil/uL — ABNORMAL LOW (ref 3.87–5.11)
RDW: 20.5 % — ABNORMAL HIGH (ref 11.5–15.5)
WBC: 6.3 10*3/uL (ref 4.0–10.5)
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
## 2022-08-02 ENCOUNTER — Other Ambulatory Visit: Payer: Self-pay | Admitting: Internal Medicine

## 2022-08-02 DIAGNOSIS — I5043 Acute on chronic combined systolic (congestive) and diastolic (congestive) heart failure: Secondary | ICD-10-CM

## 2022-08-02 DIAGNOSIS — I110 Hypertensive heart disease with heart failure: Secondary | ICD-10-CM

## 2022-08-02 DIAGNOSIS — I10 Essential (primary) hypertension: Secondary | ICD-10-CM

## 2022-08-07 NOTE — Addendum Note (Signed)
Addended by: Christy Sartorius on: 08/07/2022 11:19 AM   Modules accepted: Orders
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
## 2022-08-15 ENCOUNTER — Ambulatory Visit: Payer: Medicare Other | Admitting: Physician Assistant

## 2022-08-15 ENCOUNTER — Other Ambulatory Visit (INDEPENDENT_AMBULATORY_CARE_PROVIDER_SITE_OTHER): Payer: Medicare Other

## 2022-08-15 ENCOUNTER — Encounter: Payer: Self-pay | Admitting: Physician Assistant

## 2022-08-15 VITALS — BP 130/80 | HR 82 | Ht 59.0 in | Wt 133.0 lb

## 2022-08-15 DIAGNOSIS — Z86010 Personal history of colonic polyps: Secondary | ICD-10-CM

## 2022-08-15 DIAGNOSIS — D509 Iron deficiency anemia, unspecified: Secondary | ICD-10-CM | POA: Diagnosis not present

## 2022-08-15 DIAGNOSIS — K5520 Angiodysplasia of colon without hemorrhage: Secondary | ICD-10-CM

## 2022-08-15 DIAGNOSIS — E538 Deficiency of other specified B group vitamins: Secondary | ICD-10-CM

## 2022-08-15 LAB — CBC WITH DIFFERENTIAL/PLATELET
Basophils Absolute: 0 10*3/uL (ref 0.0–0.1)
Basophils Relative: 0.4 % (ref 0.0–3.0)
Eosinophils Absolute: 0.1 10*3/uL (ref 0.0–0.7)
Eosinophils Relative: 1.4 % (ref 0.0–5.0)
HCT: 29.7 % — ABNORMAL LOW (ref 36.0–46.0)
Hemoglobin: 9.4 g/dL — ABNORMAL LOW (ref 12.0–15.0)
Lymphocytes Relative: 23 % (ref 12.0–46.0)
Lymphs Abs: 1.7 10*3/uL (ref 0.7–4.0)
MCHC: 31.7 g/dL (ref 30.0–36.0)
MCV: 87.8 fl (ref 78.0–100.0)
Monocytes Absolute: 0.8 10*3/uL (ref 0.1–1.0)
Monocytes Relative: 10.4 % (ref 3.0–12.0)
Neutro Abs: 4.9 10*3/uL (ref 1.4–7.7)
Neutrophils Relative %: 64.8 % (ref 43.0–77.0)
Platelets: 264 10*3/uL (ref 150.0–400.0)
RBC: 3.38 Mil/uL — ABNORMAL LOW (ref 3.87–5.11)
RDW: 26.6 % — ABNORMAL HIGH (ref 11.5–15.5)
WBC: 7.6 10*3/uL (ref 4.0–10.5)

## 2022-08-15 LAB — COMPREHENSIVE METABOLIC PANEL
ALT: 13 U/L (ref 0–35)
AST: 17 U/L (ref 0–37)
Albumin: 4.3 g/dL (ref 3.5–5.2)
Alkaline Phosphatase: 75 U/L (ref 39–117)
BUN: 26 mg/dL — ABNORMAL HIGH (ref 6–23)
CO2: 24 mEq/L (ref 19–32)
Calcium: 9.3 mg/dL (ref 8.4–10.5)
Chloride: 108 mEq/L (ref 96–112)
Creatinine, Ser: 1.52 mg/dL — ABNORMAL HIGH (ref 0.40–1.20)
GFR: 34.47 mL/min — ABNORMAL LOW (ref >60.00)
Glucose, Bld: 98 mg/dL (ref 70–99)
Potassium: 3.9 mEq/L (ref 3.5–5.1)
Sodium: 141 mEq/L (ref 135–145)
Total Bilirubin: 0.2 mg/dL (ref 0.2–1.2)
Total Protein: 7.3 g/dL (ref 6.0–8.3)

## 2022-08-15 LAB — IBC + FERRITIN
Ferritin: 31 ng/mL (ref 10.0–291.0)
Iron: 62 ug/dL (ref 42–145)
Saturation Ratios: 17.1 % — ABNORMAL LOW (ref 20.0–50.0)
TIBC: 362.6 ug/dL (ref 250.0–450.0)
Transferrin: 259 mg/dL (ref 212.0–360.0)

## 2022-08-15 LAB — VITAMIN B12: Vitamin B-12: 240 pg/mL (ref 211–911)

## 2022-08-15 NOTE — Patient Instructions (Addendum)
Your provider has requested that you go to the basement level for lab work before leaving today. Press "B" on the elevator. The lab is located at the first door on the left as you exit the elevator.   First do a trial off milk/lactose products if you use them.  Add fiber like benefiber or citracel once a day Increase activity Can do trial of IBGard which is over the counter for AB pain- Take 1-2 capsules once a day for maintence or twice a day during a flare For IBS and peppermint oil.  Peppermint oil has been proven to be better than placebo for cramping for IBS Stop if it worsens heart burn or causes flushing of your face.  Ideally enteric coated peppermint oil capsules 2 a day is best but if you got the oil, you can use 0.69ml or 180 mg of pepperment oil up to 3 x a day.   Miralax is an osmotic laxative.  It only brings more water into the stool.  This is safe to take daily.  Can take up to 17 gram of miralax twice a day.  Mix with juice or coffee.  Start 1 capful at night for 3-4 days and reassess your response in 3-4 days.  You can increase and decrease the dose based on your response.  Remember, it can take up to 3-4 days to take effect OR for the effects to wear off.   I often pair this with benefiber in the morning to help assure the stool is not too loose.     FODMAP stands for fermentable oligo-, di-, mono-saccharides and polyols (1). These are the scientific terms used to classify groups of carbs that are difficult for our body to digest and that are notorious for triggering digestive symptoms like bloating, gas, loose stools and stomach pain.   You can try low FODMAP diet  - start with eliminating just one column at a time that you feel may be a trigger for you. - the table at the very bottom contains foods that are low in FODMAPs   Sometimes trying to eliminate the FODMAP's from your diet is difficult or tricky, if you are stuggling with trying to do the elimination diet  you can try an enzyme.  There is a food enzymes that you sprinkle in or on your food that helps break down the FODMAP. You can read more about the enzyme by going to this site: https://fodzyme.com/   B12 is mainly in meat, so increase meat can help this but often people have a deficiency that they are just not absorbing it well with meat or pills, so get the sublingual/melt in your mouth one.   If the sublingual one dose not increase your level, we will discuss shots.   Abdominal bloating and discomfort may be due to intestinal sensitivity or symptoms of irritable bowel syndrome. To relieve symptoms, avoid:  Broccoli  Baked beans  Cabbage  Carbonated drinks  Cauliflower  Chewing gum  Hard candy Abdominal distention resulting from weak abdominal muscles:  Is better in the morning  Gets worse as the day progresses  Is relieved by lying down Flatulence is gas created through bacterial action in the bowel and passed rectally. Keep in mind that:  10-18 passages per day are normal  Primary gases are harmless and odorless  Noticeable smells are trace gases related to food intake Foods to AVOID that are likely to form gas include:  Milk, dairy products, and medications that contain  lactose--If your body doesn't produce the enzyme (lactase) to break it down.  Certain vegetables--baked beans, cauliflower, broccoli, cabbage     Vitamin B12 Deficiency Vitamin B12 deficiency occurs when the body does not have enough vitamin B12, which is an important vitamin. The body needs this vitamin: To make red blood cells. To make DNA. This is the genetic material inside cells. To help the nerves work properly so they can carry messages from the brain to the body. Vitamin B12 deficiency can cause various health problems, such as a low red blood cell count (anemia) or nerve damage. What are the causes? This condition may be caused by: Not eating enough foods that contain vitamin B12. Not having  enough stomach acid and digestive fluids to properly absorb vitamin B12 from the food that you eat. Certain digestive system diseases that make it hard to absorb vitamin B12. These diseases include Crohn's disease, chronic pancreatitis, and cystic fibrosis. A condition in which the body does not make enough of a protein (intrinsic factor), resulting in too few red blood cells (pernicious anemia). Having a surgery in which part of the stomach or small intestine is removed. Taking certain medicines that make it hard for the body to absorb vitamin B12. These medicines include: Heartburn medicines (antacids and proton pump inhibitors). Certain antibiotic medicines. Some medicines that are used to treat diabetes, tuberculosis, gout, or high cholesterol. What increases the risk? The following factors may make you more likely to develop a B12 deficiency: Being older than age 40. Eating a vegetarian or vegan diet, especially while you are pregnant. Eating a poor diet while you are pregnant. Taking certain medicines. Having alcoholism. What are the signs or symptoms? In some cases, there are no symptoms of this condition. If the condition leads to anemia or nerve damage, various symptoms can occur, such as: Weakness. Fatigue. Loss of appetite. Weight loss. Numbness or tingling in your hands and feet. Redness and burning of the tongue. Confusion or memory problems. Depression. Sensory problems, such as color blindness, ringing in the ears, or loss of taste. Diarrhea or constipation. Trouble walking. If anemia is severe, symptoms can include: Shortness of breath. Dizziness. Rapid heart rate (tachycardia).  Due to recent changes in healthcare laws, you may see the results of your imaging and laboratory studies on MyChart before your provider has had a chance to review them.  We understand that in some cases there may be results that are confusing or concerning to you. Not all laboratory results  come back in the same time frame and the provider may be waiting for multiple results in order to interpret others.  Please give [place] 48 hours in order for your provider to thoroughly review all the results before contacting the office for clarification of your results.     I appreciate the  opportunity to care for you  Thank You   [facility]

## 2022-09-11 ENCOUNTER — Encounter (HOSPITAL_BASED_OUTPATIENT_CLINIC_OR_DEPARTMENT_OTHER): Payer: Self-pay | Admitting: Family

## 2022-09-11 ENCOUNTER — Ambulatory Visit (INDEPENDENT_AMBULATORY_CARE_PROVIDER_SITE_OTHER): Payer: Medicare Other | Admitting: Family

## 2022-09-11 VITALS — BP 144/78 | HR 81 | Ht 59.0 in | Wt 132.0 lb

## 2022-09-11 DIAGNOSIS — I10 Essential (primary) hypertension: Secondary | ICD-10-CM

## 2022-09-11 DIAGNOSIS — I700 Atherosclerosis of aorta: Secondary | ICD-10-CM

## 2022-09-11 DIAGNOSIS — E785 Hyperlipidemia, unspecified: Secondary | ICD-10-CM

## 2022-09-11 DIAGNOSIS — I5042 Chronic combined systolic (congestive) and diastolic (congestive) heart failure: Secondary | ICD-10-CM

## 2022-09-11 NOTE — Patient Instructions (Addendum)
Medication Instructions:  Continue your current medications.    Follow-Up: Follow up as scheduled    Special Instructions:   Keep up the great working checking your blood pressure at home.   If blood pressure is consistently more than 145/85 on your home readings at least an hour after your medications, call and let us know.
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
## 2022-09-15 ENCOUNTER — Encounter (HOSPITAL_BASED_OUTPATIENT_CLINIC_OR_DEPARTMENT_OTHER): Payer: Self-pay | Admitting: Family

## 2022-10-04 ENCOUNTER — Other Ambulatory Visit: Payer: Self-pay | Admitting: Internal Medicine

## 2022-10-04 DIAGNOSIS — F339 Major depressive disorder, recurrent, unspecified: Secondary | ICD-10-CM

## 2022-10-23 ENCOUNTER — Other Ambulatory Visit: Payer: Self-pay | Admitting: *Deleted

## 2022-10-30 ENCOUNTER — Ambulatory Visit (HOSPITAL_BASED_OUTPATIENT_CLINIC_OR_DEPARTMENT_OTHER): Payer: Medicare Other | Admitting: Family

## 2022-11-12 ENCOUNTER — Other Ambulatory Visit: Payer: Self-pay | Admitting: Internal Medicine

## 2022-11-12 ENCOUNTER — Ambulatory Visit (INDEPENDENT_AMBULATORY_CARE_PROVIDER_SITE_OTHER): Payer: Medicare Other | Admitting: Internal Medicine

## 2022-11-12 ENCOUNTER — Encounter: Payer: Self-pay | Admitting: Internal Medicine

## 2022-11-12 VITALS — BP 173/91 | HR 70 | Temp 98.8°F | Ht 60.5 in | Wt 133.4 lb

## 2022-11-12 DIAGNOSIS — Z780 Asymptomatic menopausal state: Secondary | ICD-10-CM | POA: Diagnosis not present

## 2022-11-12 DIAGNOSIS — I110 Hypertensive heart disease with heart failure: Secondary | ICD-10-CM | POA: Diagnosis not present

## 2022-11-12 DIAGNOSIS — I10 Essential (primary) hypertension: Secondary | ICD-10-CM | POA: Diagnosis not present

## 2022-11-12 DIAGNOSIS — R928 Other abnormal and inconclusive findings on diagnostic imaging of breast: Secondary | ICD-10-CM

## 2022-11-12 DIAGNOSIS — Z13820 Encounter for screening for osteoporosis: Secondary | ICD-10-CM | POA: Diagnosis not present

## 2022-11-12 DIAGNOSIS — D649 Anemia, unspecified: Secondary | ICD-10-CM

## 2022-11-12 DIAGNOSIS — G4700 Insomnia, unspecified: Secondary | ICD-10-CM

## 2022-11-12 DIAGNOSIS — Z1231 Encounter for screening mammogram for malignant neoplasm of breast: Secondary | ICD-10-CM | POA: Diagnosis not present

## 2022-11-12 DIAGNOSIS — Z0000 Encounter for general adult medical examination without abnormal findings: Secondary | ICD-10-CM

## 2022-11-12 DIAGNOSIS — Z0001 Encounter for general adult medical examination with abnormal findings: Secondary | ICD-10-CM

## 2022-11-12 DIAGNOSIS — Z1159 Encounter for screening for other viral diseases: Secondary | ICD-10-CM | POA: Diagnosis not present

## 2022-11-12 DIAGNOSIS — Z23 Encounter for immunization: Secondary | ICD-10-CM

## 2022-11-12 LAB — LIPID PANEL
Cholesterol: 155 mg/dL (ref 0–200)
HDL: 60.1 mg/dL (ref >39.00)
LDL Cholesterol: 82 mg/dL (ref 0–99)
NonHDL: 95
Total CHOL/HDL Ratio: 3
Triglycerides: 67 mg/dL (ref 0.0–149.0)
VLDL: 13.4 mg/dL (ref 0.0–40.0)

## 2022-11-12 LAB — CBC WITH DIFFERENTIAL/PLATELET
Basophils Absolute: 0.1 10*3/uL (ref 0.0–0.1)
Basophils Relative: 0.9 % (ref 0.0–3.0)
Eosinophils Absolute: 0.1 10*3/uL (ref 0.0–0.7)
Eosinophils Relative: 2.1 % (ref 0.0–5.0)
HCT: 36 % (ref 36.0–46.0)
Hemoglobin: 11.8 g/dL — ABNORMAL LOW (ref 12.0–15.0)
Lymphocytes Relative: 40 % (ref 12.0–46.0)
Lymphs Abs: 2.2 10*3/uL (ref 0.7–4.0)
MCHC: 32.9 g/dL (ref 30.0–36.0)
MCV: 98.3 fL (ref 78.0–100.0)
Monocytes Absolute: 0.7 10*3/uL (ref 0.1–1.0)
Monocytes Relative: 12.1 % — ABNORMAL HIGH (ref 3.0–12.0)
Neutro Abs: 2.5 10*3/uL (ref 1.4–7.7)
Neutrophils Relative %: 44.9 % (ref 43.0–77.0)
Platelets: 238 10*3/uL (ref 150.0–400.0)
RBC: 3.66 Mil/uL — ABNORMAL LOW (ref 3.87–5.11)
RDW: 14.6 % (ref 11.5–15.5)
WBC: 5.5 10*3/uL (ref 4.0–10.5)

## 2022-11-12 LAB — COMPREHENSIVE METABOLIC PANEL
ALT: 12 U/L (ref 0–35)
AST: 19 U/L (ref 0–37)
Albumin: 4.2 g/dL (ref 3.5–5.2)
Alkaline Phosphatase: 66 U/L (ref 39–117)
BUN: 18 mg/dL (ref 6–23)
CO2: 29 meq/L (ref 19–32)
Calcium: 9.4 mg/dL (ref 8.4–10.5)
Chloride: 105 meq/L (ref 96–112)
Creatinine, Ser: 1.37 mg/dL — ABNORMAL HIGH (ref 0.40–1.20)
GFR: 38.98 mL/min — ABNORMAL LOW (ref >60.00)
Glucose, Bld: 93 mg/dL (ref 70–99)
Potassium: 3.7 meq/L (ref 3.5–5.1)
Sodium: 139 meq/L (ref 135–145)
Total Bilirubin: 0.3 mg/dL (ref 0.2–1.2)
Total Protein: 7.1 g/dL (ref 6.0–8.3)

## 2022-11-12 MED ORDER — ZOLPIDEM TARTRATE 5 MG PO TABS: 5.0000 mg | ORAL_TABLET | Freq: Every evening | ORAL | 1 refills | Status: DC | PRN

## 2022-11-12 NOTE — Addendum Note (Signed)
Addended by: Kern Reap B on: 11/12/2022 11:38 AM   Modules accepted: Orders
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
## 2022-11-13 LAB — HEPATITIS C ANTIBODY: Hepatitis C Ab: NONREACTIVE

## 2022-11-14 ENCOUNTER — Telehealth: Payer: Self-pay | Admitting: Internal Medicine

## 2022-11-14 NOTE — Telephone Encounter (Signed)
Pt is calling and would like blood work result 

## 2022-11-17 NOTE — Telephone Encounter (Signed)
Reviewed lab results with patient.

## 2022-12-10 ENCOUNTER — Other Ambulatory Visit (HOSPITAL_BASED_OUTPATIENT_CLINIC_OR_DEPARTMENT_OTHER): Payer: Self-pay | Admitting: Family

## 2022-12-10 DIAGNOSIS — I110 Hypertensive heart disease with heart failure: Secondary | ICD-10-CM

## 2022-12-11 ENCOUNTER — Ambulatory Visit
Admission: RE | Admit: 2022-12-11 | Discharge: 2022-12-11 | Disposition: A | Discharge status: Home / Self Care | Payer: Medicare Other | Source: Ambulatory Visit | Attending: Internal Medicine | Admitting: Internal Medicine

## 2022-12-11 ENCOUNTER — Ambulatory Visit: Payer: Medicare Other

## 2022-12-11 ENCOUNTER — Other Ambulatory Visit: Payer: Self-pay | Admitting: Internal Medicine

## 2022-12-11 DIAGNOSIS — Z1231 Encounter for screening mammogram for malignant neoplasm of breast: Secondary | ICD-10-CM | POA: Diagnosis not present

## 2022-12-11 DIAGNOSIS — Z23 Encounter for immunization: Secondary | ICD-10-CM

## 2022-12-11 DIAGNOSIS — D649 Anemia, unspecified: Secondary | ICD-10-CM

## 2022-12-11 DIAGNOSIS — Z0000 Encounter for general adult medical examination without abnormal findings: Secondary | ICD-10-CM

## 2022-12-11 DIAGNOSIS — I110 Hypertensive heart disease with heart failure: Secondary | ICD-10-CM

## 2022-12-11 DIAGNOSIS — Z13820 Encounter for screening for osteoporosis: Secondary | ICD-10-CM

## 2022-12-11 DIAGNOSIS — I10 Essential (primary) hypertension: Secondary | ICD-10-CM

## 2022-12-11 DIAGNOSIS — G4700 Insomnia, unspecified: Secondary | ICD-10-CM

## 2022-12-11 DIAGNOSIS — Z1159 Encounter for screening for other viral diseases: Secondary | ICD-10-CM

## 2023-01-01 ENCOUNTER — Encounter (HOSPITAL_BASED_OUTPATIENT_CLINIC_OR_DEPARTMENT_OTHER): Payer: Medicare Other | Admitting: Family

## 2023-01-29 ENCOUNTER — Encounter: Payer: Self-pay | Admitting: Gastroenterology

## 2023-02-12 ENCOUNTER — Ambulatory Visit: Payer: Medicare Other | Admitting: Internal Medicine

## 2023-02-19 ENCOUNTER — Ambulatory Visit: Payer: Medicare Other | Admitting: Internal Medicine

## 2023-02-19 ENCOUNTER — Encounter: Payer: Self-pay | Admitting: Internal Medicine

## 2023-02-19 VITALS — BP 158/91 | HR 70 | Temp 98.3°F | Resp 18 | Ht 60.5 in | Wt 135.6 lb

## 2023-02-19 DIAGNOSIS — F339 Major depressive disorder, recurrent, unspecified: Secondary | ICD-10-CM

## 2023-02-19 DIAGNOSIS — I110 Hypertensive heart disease with heart failure: Secondary | ICD-10-CM | POA: Diagnosis not present

## 2023-02-19 DIAGNOSIS — I10 Essential (primary) hypertension: Secondary | ICD-10-CM | POA: Diagnosis not present

## 2023-02-19 DIAGNOSIS — I5042 Chronic combined systolic (congestive) and diastolic (congestive) heart failure: Secondary | ICD-10-CM

## 2023-02-19 DIAGNOSIS — G4700 Insomnia, unspecified: Secondary | ICD-10-CM | POA: Diagnosis not present

## 2023-02-19 MED ORDER — ZOLPIDEM TARTRATE 10 MG PO TABS: 10.0000 mg | ORAL_TABLET | Freq: Every evening | ORAL | 0 refills | Status: DC | PRN
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
## 2023-02-26 ENCOUNTER — Encounter (HOSPITAL_BASED_OUTPATIENT_CLINIC_OR_DEPARTMENT_OTHER): Payer: Self-pay | Admitting: Family

## 2023-02-26 ENCOUNTER — Ambulatory Visit (HOSPITAL_BASED_OUTPATIENT_CLINIC_OR_DEPARTMENT_OTHER): Payer: Medicare Other | Admitting: Family

## 2023-02-26 VITALS — BP 126/56 | HR 69 | Ht 59.0 in | Wt 133.5 lb

## 2023-02-26 DIAGNOSIS — D509 Iron deficiency anemia, unspecified: Secondary | ICD-10-CM | POA: Diagnosis not present

## 2023-02-26 DIAGNOSIS — I5022 Chronic systolic (congestive) heart failure: Secondary | ICD-10-CM | POA: Diagnosis not present

## 2023-02-26 DIAGNOSIS — E785 Hyperlipidemia, unspecified: Secondary | ICD-10-CM

## 2023-02-26 DIAGNOSIS — I700 Atherosclerosis of aorta: Secondary | ICD-10-CM | POA: Diagnosis not present

## 2023-02-26 DIAGNOSIS — I10 Essential (primary) hypertension: Secondary | ICD-10-CM

## 2023-02-26 LAB — CBC WITH DIFFERENTIAL/PLATELET
Basophils Absolute: 0 10*3/uL (ref 0.0–0.2)
Basos: 1 %
EOS (ABSOLUTE): 0.1 10*3/uL (ref 0.0–0.4)
Eos: 1 %
Hematocrit: 36.1 % (ref 34.0–46.6)
Hemoglobin: 11.9 g/dL (ref 11.1–15.9)
Immature Grans (Abs): 0 10*3/uL (ref 0.0–0.1)
Immature Granulocytes: 0 %
Lymphocytes Absolute: 1.9 10*3/uL (ref 0.7–3.1)
Lymphs: 31 %
MCH: 32.8 pg (ref 26.6–33.0)
MCHC: 33 g/dL (ref 31.5–35.7)
MCV: 99 fL — ABNORMAL HIGH (ref 79–97)
Monocytes Absolute: 0.7 10*3/uL (ref 0.1–0.9)
Monocytes: 12 %
Neutrophils Absolute: 3.3 10*3/uL (ref 1.4–7.0)
Neutrophils: 55 %
Platelets: 277 10*3/uL (ref 150–450)
RBC: 3.63 x10E6/uL — ABNORMAL LOW (ref 3.77–5.28)
RDW: 12.3 % (ref 11.7–15.4)
WBC: 6.1 10*3/uL (ref 3.4–10.8)

## 2023-02-26 LAB — BASIC METABOLIC PANEL
BUN/Creatinine Ratio: 15 (ref 12–28)
BUN: 23 mg/dL (ref 8–27)
CO2: 19 mmol/L — ABNORMAL LOW (ref 20–29)
Calcium: 9.7 mg/dL (ref 8.7–10.3)
Chloride: 105 mmol/L (ref 96–106)
Creatinine, Ser: 1.49 mg/dL — ABNORMAL HIGH (ref 0.57–1.00)
Glucose: 119 mg/dL — ABNORMAL HIGH (ref 70–99)
Potassium: 4.3 mmol/L (ref 3.5–5.2)
Sodium: 140 mmol/L (ref 134–144)
eGFR: 37 mL/min/{1.73_m2} — ABNORMAL LOW (ref >59)

## 2023-02-26 NOTE — Patient Instructions (Addendum)
 Medication Instructions:  Continue your current medications.   Labwork: Your physician recommends that you return for lab work today: BMET, CBC, IBC + Ferritin  Follow-Up: In 6 months in [facility]    Special Instructions:   To schedule gastroenterology follow up: [facility] [address] [place] [address] [place], [place] [address] [phone]
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
## 2023-02-27 ENCOUNTER — Telehealth (HOSPITAL_BASED_OUTPATIENT_CLINIC_OR_DEPARTMENT_OTHER): Payer: Self-pay

## 2023-02-27 NOTE — Telephone Encounter (Signed)
-----   Message from Clearnce Curia sent at 02/27/2023  3:07 PM EST ----- Anemia improved from prior. Kidney function overall stable. Ensure staying well hydrated. Overall Good result!

## 2023-02-27 NOTE — Telephone Encounter (Signed)
 Called and spoke to patient; dicussed results. She verbalized understanding.

## 2023-03-07 ENCOUNTER — Other Ambulatory Visit (HOSPITAL_BASED_OUTPATIENT_CLINIC_OR_DEPARTMENT_OTHER): Payer: Self-pay | Admitting: Family

## 2023-03-07 DIAGNOSIS — I10 Essential (primary) hypertension: Secondary | ICD-10-CM

## 2023-03-07 DIAGNOSIS — I5042 Chronic combined systolic (congestive) and diastolic (congestive) heart failure: Secondary | ICD-10-CM

## 2023-03-29 ENCOUNTER — Other Ambulatory Visit: Payer: Self-pay | Admitting: Internal Medicine
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
## 2023-04-08 ENCOUNTER — Encounter: Payer: Self-pay | Admitting: Physician Assistant

## 2023-04-08 ENCOUNTER — Ambulatory Visit (INDEPENDENT_AMBULATORY_CARE_PROVIDER_SITE_OTHER)
Admission: RE | Admit: 2023-04-08 | Discharge: 2023-04-08 | Disposition: A | Discharge status: Home / Self Care | Source: Ambulatory Visit | Attending: Physician Assistant | Admitting: Physician Assistant

## 2023-04-08 ENCOUNTER — Ambulatory Visit: Payer: Medicare Other | Admitting: Physician Assistant

## 2023-04-08 ENCOUNTER — Other Ambulatory Visit

## 2023-04-08 VITALS — BP 136/84 | HR 73 | Ht 59.0 in | Wt 131.2 lb

## 2023-04-08 DIAGNOSIS — R1032 Left lower quadrant pain: Secondary | ICD-10-CM

## 2023-04-08 DIAGNOSIS — D509 Iron deficiency anemia, unspecified: Secondary | ICD-10-CM | POA: Diagnosis not present

## 2023-04-08 DIAGNOSIS — E538 Deficiency of other specified B group vitamins: Secondary | ICD-10-CM

## 2023-04-08 DIAGNOSIS — R1013 Epigastric pain: Secondary | ICD-10-CM | POA: Diagnosis not present

## 2023-04-08 DIAGNOSIS — R1030 Lower abdominal pain, unspecified: Secondary | ICD-10-CM

## 2023-04-08 DIAGNOSIS — Z8719 Personal history of other diseases of the digestive system: Secondary | ICD-10-CM

## 2023-04-08 DIAGNOSIS — Z860101 Personal history of adenomatous and serrated colon polyps: Secondary | ICD-10-CM

## 2023-04-08 DIAGNOSIS — Z860102 Personal history of hyperplastic colon polyps: Secondary | ICD-10-CM | POA: Diagnosis not present

## 2023-04-08 DIAGNOSIS — K5520 Angiodysplasia of colon without hemorrhage: Secondary | ICD-10-CM

## 2023-04-08 LAB — COMPREHENSIVE METABOLIC PANEL
ALT: 12 U/L (ref 0–35)
AST: 21 U/L (ref 0–37)
Albumin: 4.5 g/dL (ref 3.5–5.2)
Alkaline Phosphatase: 58 U/L (ref 39–117)
BUN: 18 mg/dL (ref 6–23)
CO2: 24 meq/L (ref 19–32)
Calcium: 9.6 mg/dL (ref 8.4–10.5)
Chloride: 106 meq/L (ref 96–112)
Creatinine, Ser: 1.38 mg/dL — ABNORMAL HIGH (ref 0.40–1.20)
GFR: 38.54 mL/min — ABNORMAL LOW (ref >60.00)
Glucose, Bld: 95 mg/dL (ref 70–99)
Potassium: 3.7 meq/L (ref 3.5–5.1)
Sodium: 138 meq/L (ref 135–145)
Total Bilirubin: 0.4 mg/dL (ref 0.2–1.2)
Total Protein: 7.6 g/dL (ref 6.0–8.3)

## 2023-04-08 LAB — CBC WITH DIFFERENTIAL/PLATELET
Basophils Absolute: 0.1 10*3/uL (ref 0.0–0.1)
Basophils Relative: 1.1 % (ref 0.0–3.0)
Eosinophils Absolute: 0.1 10*3/uL (ref 0.0–0.7)
Eosinophils Relative: 1.7 % (ref 0.0–5.0)
HCT: 36.8 % (ref 36.0–46.0)
Hemoglobin: 12.5 g/dL (ref 12.0–15.0)
Lymphocytes Relative: 35.2 % (ref 12.0–46.0)
Lymphs Abs: 2 10*3/uL (ref 0.7–4.0)
MCHC: 33.9 g/dL (ref 30.0–36.0)
MCV: 100.7 fl — ABNORMAL HIGH (ref 78.0–100.0)
Monocytes Absolute: 0.7 10*3/uL (ref 0.1–1.0)
Monocytes Relative: 11.5 % (ref 3.0–12.0)
Neutro Abs: 2.9 10*3/uL (ref 1.4–7.7)
Neutrophils Relative %: 50.5 % (ref 43.0–77.0)
Platelets: 216 10*3/uL (ref 150.0–400.0)
RBC: 3.65 Mil/uL — ABNORMAL LOW (ref 3.87–5.11)
RDW: 13.9 % (ref 11.5–15.5)
WBC: 5.8 10*3/uL (ref 4.0–10.5)

## 2023-04-08 LAB — SEDIMENTATION RATE: Sed Rate: 12 mm/h (ref 0–30)

## 2023-04-08 LAB — IBC + FERRITIN
Ferritin: 38.9 ng/mL (ref 10.0–291.0)
Iron: 113 ug/dL (ref 42–145)
Saturation Ratios: 37 % (ref 20.0–50.0)
TIBC: 305.2 ug/dL (ref 250.0–450.0)
Transferrin: 218 mg/dL (ref 212.0–360.0)

## 2023-04-08 MED ORDER — DICYCLOMINE HCL 20 MG PO TABS: 20.0000 mg | ORAL_TABLET | Freq: Three times a day (TID) | ORAL | 0 refills | Status: DC | PRN

## 2023-04-08 NOTE — Patient Instructions (Signed)
 Your provider has requested that you go to the basement level for lab work before leaving today. Press "B" on the elevator. The lab is located at the first door on the left as you exit the elevator.  Your provider has requested that you have an abdominal x ray before leaving today. Please go to the basement floor to our Radiology department for the test.  You have been scheduled for a CT scan of the abdomen and pelvis at [facility] [place], [place], [place] [address]).   You are scheduled on 04-15-23 at 3pm. You should arrive by 1245pm your appointment time for registration. Please follow the written instructions below on the day of your exam:  WARNING: IF YOU ARE ALLERGIC TO IODINE/X-RAY DYE, PLEASE NOTIFY RADIOLOGY IMMEDIATELY AT [phone]! YOU WILL BE GIVEN A 13 HOUR PREMEDICATION PREP.  1) Do not eat or drink anything after 11am (4 hours prior to your test) 2) You will be given 2 bottles of oral contrast to drink on site.    Drink 1 bottle of contrast @  1pm (2 hours prior to your exam)  Drink 1 bottle of contrast @  2pm (1 hour prior to your exam)  You may take any medications as prescribed with a small amount of water, if necessary. If you take any of the following medications: METFORMIN, GLUCOPHAGE, GLUCOVANCE, AVANDAMET, RIOMET, FORTAMET, ACTOPLUS MET, JANUMET, GLUMETZA or METAGLIP, you MAY be asked to HOLD this medication 48 hours AFTER the exam.  The purpose of you drinking the oral contrast is to aid in the visualization of your intestinal tract. The contrast solution may cause some diarrhea. Depending on your individual set of symptoms, you may also receive an intravenous injection of x-ray contrast/dye. Plan on being at [facility] for 30 minutes or longer, depending on the type of exam you are having performed.  This test typically takes 30-45 minutes to complete.  If you have any questions regarding your exam or if you need to reschedule, you may call  the CT department at [phone] between the hours of 8:00 am and 5:00 pm, Monday-Friday.   Can take dicyclomine at least 1-2 x a day for pain if needed.   Can do heating pad and can take tylenol max of 3000mg  a day.  Can add on lidocaine patches or voltern gel Go to the ER if unable to pass gas, severe AB pain, unable to hold down food, any shortness of breath of chest pain.  First do a trial off milk/lactose products if you use them.  Add fiber like benefiber or citracel once a day Increase activity    FODMAP stands for fermentable oligo-, di-, mono-saccharides and polyols (1). These are the scientific terms used to classify groups of carbs that are difficult for our body to digest and that are notorious for triggering digestive symptoms like bloating, gas, loose stools and stomach pain.   You can try low FODMAP diet  - start with eliminating just one column at a time that you feel may be a trigger for you. - the table at the very bottom contains foods that are low in FODMAPs   Sometimes trying to eliminate the FODMAP's from your diet is difficult or tricky, if you are stuggling with trying to do the elimination diet you can try an enzyme.  There is a food enzymes that you sprinkle in or on your food that helps break down the FODMAP. You can read more about the enzyme  by going to this site: https://fodzyme.com/     Diverticulitis Diverticulitis is inflammation or infection of small pouches in your colon that form when you have a condition called diverticulosis. The pouches in your colon are called diverticula. Your colon, or large intestine, is where water is absorbed and stool is formed. Complications of diverticulitis can include: Bleeding. Severe infection. Severe pain. Perforation of your colon. Obstruction of your colon.  What are the causes? Diverticulitis is caused by bacteria. Diverticulitis happens when stool becomes trapped in diverticula. This allows bacteria to  grow in the diverticula, which can lead to inflammation and infection. What increases the risk? People with diverticulosis are at risk for diverticulitis. Eating a diet that does not include enough fiber from fruits and vegetables may make diverticulitis more likely to develop. What are the signs or symptoms? Symptoms of diverticulitis may include: Abdominal pain and tenderness. The pain is normally located on the left side of the abdomen, but may occur in other areas. Fever and chills. Bloating. Cramping. Nausea. Vomiting. Constipation. Diarrhea. Blood in your stool.  How is this diagnosed? Your health care provider will ask you about your medical history and do a physical exam. You may need to have tests done because many medical conditions can cause the same symptoms as diverticulitis. Tests may include: Blood tests. Urine tests. Imaging tests of the abdomen, including X-rays and CT scans.  When your condition is under control, your health care provider may recommend that you have a colonoscopy. A colonoscopy can show how severe your diverticula are and whether something else is causing your symptoms. How is this treated? Most cases of diverticulitis are mild and can be treated at home. Treatment may include: Taking over-the-counter pain medicines. Following a clear liquid diet. Taking antibiotic medicines by mouth for 7-10 days.  More severe cases may be treated at a hospital. Treatment may include: Not eating or drinking. Taking prescription pain medicine. Receiving antibiotic medicines through an IV tube. Receiving fluids and nutrition through an IV tube. Surgery.  Follow these instructions at home: Follow your health care provider's instructions carefully. Follow a full liquid diet or other diet as directed by your health care provider. After your symptoms improve, your health care provider may tell you to change your diet. He or she may recommend you eat a high-fiber  diet. Fruits and vegetables are good sources of fiber. Fiber makes it easier to pass stool. Take fiber supplements or probiotics as directed by your health care provider. Only take medicines as directed by your health care provider. Keep all your follow-up appointments. Contact a health care provider if: Your pain does not improve. You have a hard time eating food. Your bowel movements do not return to normal. Get help right away if: Your pain becomes worse. Your symptoms do not get better. Your symptoms suddenly get worse. You have a fever. You have repeated vomiting. You have bloody or black, tarry stools. This information is not intended to replace advice given to you by your health care provider. Make sure you discuss any questions you have with your health care provider. Document Released: 10/16/2004 Document Revised: 06/14/2015 Document Reviewed: 12/01/2012 Elsevier Interactive Patient Education  2017 [employer]   Small intestinal bacterial overgrowth (SIBO) occurs when there is an abnormal increase in the overall bacterial population in the small intestine -- particularly types of bacteria not commonly found in that part of the digestive tract. Small intestinal bacterial overgrowth (SIBO) commonly results when a circumstance -- such  as surgery or disease -- slows the passage of food and waste products in the digestive tract, creating a breeding ground for bacteria.  Signs and symptoms of SIBO often include: Loss of appetite Abdominal pain Nausea Bloating An uncomfortable feeling of fullness after eating Diarrhea or constipation, depending on the type of gas produced  What foods trigger SIBO? While foods aren't the original cause of SIBO, certain foods do encourage the overgrowth of the wrong bacteria in your small intestine. If you're feeding them their favorite foods, they're going to grow more, and that will trigger more of your SIBO symptoms. By the same token, you can  help reduce the overgrowth by starving the problematic bacteria of their favorite foods. This strategy has led to a number of proposed SIBO eating plans. The plans vary, and so do individual results. But in general, they tend to recommend limiting carbohydrates.  These include: Sugars and sweeteners. Fruits and starchy vegetables. Dairy products. Grains.  There is a test for this we can do called a breath test, if you are positive we will treat you with an antibiotic to see if it helps.  Your symptoms are very suspicious for this condition, as discussed, we will start you on an antibiotic to see if this helps.

## 2023-04-14 ENCOUNTER — Other Ambulatory Visit: Payer: Self-pay | Admitting: Internal Medicine

## 2023-04-14 DIAGNOSIS — G4700 Insomnia, unspecified: Secondary | ICD-10-CM

## 2023-04-15 ENCOUNTER — Ambulatory Visit (HOSPITAL_COMMUNITY)
Admission: RE | Admit: 2023-04-15 | Discharge: 2023-04-15 | Disposition: A | Discharge status: Home / Self Care | Source: Ambulatory Visit | Attending: Physician Assistant | Admitting: Physician Assistant

## 2023-04-15 DIAGNOSIS — N289 Disorder of kidney and ureter, unspecified: Secondary | ICD-10-CM | POA: Diagnosis not present

## 2023-04-15 DIAGNOSIS — Z90710 Acquired absence of both cervix and uterus: Secondary | ICD-10-CM | POA: Diagnosis not present

## 2023-04-15 DIAGNOSIS — R1032 Left lower quadrant pain: Secondary | ICD-10-CM | POA: Diagnosis not present

## 2023-04-15 MED ORDER — IOHEXOL 9 MG/ML PO SOLN
500.0000 mL | ORAL | Status: AC
  Administered 2023-04-15: 1000 mL via ORAL

## 2023-04-15 MED ORDER — IOHEXOL 9 MG/ML PO SOLN
ORAL | Status: AC
  Filled 2023-04-15: qty 1000

## 2023-04-15 MED ORDER — SODIUM CHLORIDE (PF) 0.9 % IJ SOLN
INTRAMUSCULAR | Status: AC
  Filled 2023-04-15: qty 50

## 2023-04-15 MED ORDER — IOHEXOL 300 MG/ML  SOLN
100.0000 mL | Freq: Once | INTRAMUSCULAR | Status: AC | PRN
  Administered 2023-04-15: 100 mL via INTRAVENOUS

## 2023-04-30 DIAGNOSIS — R051 Acute cough: Secondary | ICD-10-CM | POA: Diagnosis not present

## 2023-05-17 NOTE — Progress Notes (Signed)
 Agree with assessment/plan.  Edman Circle, MD [facility] [phone]

## 2023-05-19 ENCOUNTER — Other Ambulatory Visit: Payer: Self-pay | Admitting: Internal Medicine

## 2023-05-19 ENCOUNTER — Other Ambulatory Visit: Payer: Self-pay | Admitting: Physician Assistant

## 2023-05-19 DIAGNOSIS — G4700 Insomnia, unspecified: Secondary | ICD-10-CM

## 2023-06-22 ENCOUNTER — Other Ambulatory Visit (HOSPITAL_BASED_OUTPATIENT_CLINIC_OR_DEPARTMENT_OTHER): Payer: Self-pay | Admitting: Family

## 2023-06-22 ENCOUNTER — Other Ambulatory Visit: Payer: Self-pay | Admitting: Internal Medicine

## 2023-06-22 DIAGNOSIS — I5042 Chronic combined systolic (congestive) and diastolic (congestive) heart failure: Secondary | ICD-10-CM

## 2023-06-22 DIAGNOSIS — G4700 Insomnia, unspecified: Secondary | ICD-10-CM

## 2023-06-22 DIAGNOSIS — I10 Essential (primary) hypertension: Secondary | ICD-10-CM

## 2023-06-23 ENCOUNTER — Other Ambulatory Visit: Payer: Medicare Other

## 2023-07-21 ENCOUNTER — Other Ambulatory Visit (HOSPITAL_BASED_OUTPATIENT_CLINIC_OR_DEPARTMENT_OTHER): Payer: Self-pay | Admitting: Family

## 2023-07-21 ENCOUNTER — Other Ambulatory Visit: Payer: Self-pay | Admitting: Internal Medicine

## 2023-07-21 ENCOUNTER — Other Ambulatory Visit: Payer: Self-pay | Admitting: Physician Assistant

## 2023-07-21 DIAGNOSIS — G4700 Insomnia, unspecified: Secondary | ICD-10-CM

## 2023-07-21 DIAGNOSIS — I10 Essential (primary) hypertension: Secondary | ICD-10-CM

## 2023-07-21 DIAGNOSIS — E782 Mixed hyperlipidemia: Secondary | ICD-10-CM

## 2023-08-19 ENCOUNTER — Telehealth: Payer: Self-pay

## 2023-08-19 NOTE — Telephone Encounter (Signed)
 Patient was identified as falling into the [place] Measure - Diabetes.   Patient was: Attribution and/or data issue.  Validation/Investigation needed.  Explanation:  No diagnosis of Diabetes in patient's medical history.

## 2023-08-24 ENCOUNTER — Other Ambulatory Visit: Payer: Self-pay | Admitting: Internal Medicine

## 2023-08-27 ENCOUNTER — Other Ambulatory Visit: Payer: Self-pay | Admitting: Internal Medicine

## 2023-08-27 ENCOUNTER — Telehealth: Payer: Self-pay

## 2023-08-27 DIAGNOSIS — G4700 Insomnia, unspecified: Secondary | ICD-10-CM

## 2023-08-27 NOTE — Telephone Encounter (Signed)
 Refill sent.

## 2023-08-27 NOTE — Telephone Encounter (Signed)
 Copied from CRM #[id number]. Topic: Clinical - Medication Question >> Aug 27, 2023  9:44 AM Chiquita SQUIBB wrote: Reason for CRM: Patient is calling in regarding the zolpidem  (AMBIEN ) 10 MG tablet [[id number]]. Patient has been out of it for three days and the pharmacy has not received it, refill submitted but not yet signed. Patient is asking if this could be sent over today.

## 2023-09-22 ENCOUNTER — Emergency Department (HOSPITAL_BASED_OUTPATIENT_CLINIC_OR_DEPARTMENT_OTHER): Admitting: Radiology

## 2023-09-22 ENCOUNTER — Emergency Department (HOSPITAL_BASED_OUTPATIENT_CLINIC_OR_DEPARTMENT_OTHER)
Admission: EM | Admit: 2023-09-22 | Discharge: 2023-09-22 | Disposition: A | Discharge status: Home / Self Care | Attending: Emergency Medicine | Admitting: Emergency Medicine

## 2023-09-22 ENCOUNTER — Encounter (HOSPITAL_BASED_OUTPATIENT_CLINIC_OR_DEPARTMENT_OTHER): Payer: Self-pay | Admitting: Emergency Medicine

## 2023-09-22 ENCOUNTER — Other Ambulatory Visit: Payer: Self-pay

## 2023-09-22 DIAGNOSIS — J42 Unspecified chronic bronchitis: Secondary | ICD-10-CM | POA: Diagnosis not present

## 2023-09-22 DIAGNOSIS — Z7982 Long term (current) use of aspirin: Secondary | ICD-10-CM | POA: Diagnosis not present

## 2023-09-22 DIAGNOSIS — I10 Essential (primary) hypertension: Secondary | ICD-10-CM | POA: Insufficient documentation

## 2023-09-22 DIAGNOSIS — Z981 Arthrodesis status: Secondary | ICD-10-CM | POA: Diagnosis not present

## 2023-09-22 DIAGNOSIS — J069 Acute upper respiratory infection, unspecified: Secondary | ICD-10-CM | POA: Insufficient documentation

## 2023-09-22 DIAGNOSIS — Z79899 Other long term (current) drug therapy: Secondary | ICD-10-CM | POA: Diagnosis not present

## 2023-09-22 DIAGNOSIS — R059 Cough, unspecified: Secondary | ICD-10-CM | POA: Diagnosis not present

## 2023-09-22 LAB — CBC
HCT: 37.1 % (ref 36.0–46.0)
Hemoglobin: 12.5 g/dL (ref 12.0–15.0)
MCH: 33.2 pg (ref 26.0–34.0)
MCHC: 33.7 g/dL (ref 30.0–36.0)
MCV: 98.7 fL (ref 80.0–100.0)
Platelets: 215 K/uL (ref 150–400)
RBC: 3.76 MIL/uL — ABNORMAL LOW (ref 3.87–5.11)
RDW: 13.4 % (ref 11.5–15.5)
WBC: 4.8 K/uL (ref 4.0–10.5)
nRBC: 0 % (ref 0.0–0.2)

## 2023-09-22 LAB — BASIC METABOLIC PANEL WITH GFR
Anion gap: 12 (ref 5–15)
BUN: 14 mg/dL (ref 8–23)
CO2: 22 mmol/L (ref 22–32)
Calcium: 9.7 mg/dL (ref 8.9–10.3)
Chloride: 102 mmol/L (ref 98–111)
Creatinine, Ser: 1.24 mg/dL — ABNORMAL HIGH (ref 0.44–1.00)
GFR, Estimated: 46 mL/min — ABNORMAL LOW (ref >60)
Glucose, Bld: 99 mg/dL (ref 70–99)
Potassium: 3.9 mmol/L (ref 3.5–5.1)
Sodium: 136 mmol/L (ref 135–145)

## 2023-09-22 LAB — RESP PANEL BY RT-PCR (RSV, FLU A&B, COVID)  RVPGX2
Influenza A by PCR: NEGATIVE
Influenza B by PCR: NEGATIVE
Resp Syncytial Virus by PCR: NEGATIVE
SARS Coronavirus 2 by RT PCR: NEGATIVE

## 2023-09-22 LAB — TROPONIN T, HIGH SENSITIVITY: Troponin T High Sensitivity: <15 ng/L (ref 0–19)

## 2023-09-22 NOTE — ED Provider Notes (Signed)
 [place] EMERGENCY DEPARTMENT AT [facility] Provider Note   CSN: [id number] Arrival date & time: 09/22/23  9042     Patient presents with: Cough   [name] is a 73 y.o. female.    Cough Patient presents with shortness of breath and cough.  Had for the last 2 days.  States she felt as if she had a sinus infection.  Now coughing more.  States she feels like her heart will race with it.  Did not take her blood pressure medicine today.  No chest pain.  No fevers.  No swelling in her legs.     Prior to Admission medications   Medication Sig Start Date End Date Taking? Authorizing Provider  albuterol  (VENTOLIN  HFA) 108 (90 Base) MCG/ACT inhaler Inhale 2 puffs into the lungs every 4 (four) hours as needed for wheezing or shortness of breath. Patient not taking: Reported on 04/08/2023 08/04/22   Theophilus Andrews, Tully GRADE, MD  aspirin  EC 81 MG tablet Take 1 tablet (81 mg total) by mouth daily. Swallow whole. 10/09/21   Walker, Caitlin S, NP  atorvastatin  (LIPITOR) 40 MG tablet TAKE 1 TABLET BY MOUTH DAILY AT 6 PM. 07/21/23   Walker, Caitlin S, NP  carvedilol  (COREG ) 25 MG tablet TAKE 1 TABLET BY MOUTH TWICE A DAY 07/21/23   Walker, Caitlin S, NP  dicyclomine  (BENTYL ) 20 MG tablet TAKE 1 TABLET (20 MG TOTAL) BY MOUTH EVERY 8 (EIGHT) HOURS AS NEEDED FOR SPASMS (AB PAIN). 07/21/23   Collier, Amanda R, PA-C  diphenoxylate -atropine  (LOMOTIL ) 2.5-0.025 MG tablet Take 1 tablet by mouth 2 (two) times daily as needed for diarrhea or loose stools. 01/25/19   Theophilus Andrews, Tully GRADE, MD  ferrous sulfate  325 (65 FE) MG tablet Take 1 tablet (325 mg total) by mouth daily with breakfast. 07/20/22   Cindy Garnette POUR, MD  furosemide  (LASIX ) 20 MG tablet TAKE 1 TABLET BY MOUTH EVERY DAY 06/22/23   Walker, Caitlin S, NP  hydrALAZINE  (APRESOLINE ) 100 MG tablet TAKE 1 TABLET BY MOUTH 3 TIMES DAILY. 07/21/23   Vannie Reche RAMAN, NP  mirtazapine  (REMERON ) 15 MG tablet TAKE 1 TABLET BY MOUTH EVERYDAY AT BEDTIME  08/24/23   Theophilus Andrews, Tully GRADE, MD  pantoprazole  (PROTONIX ) 20 MG tablet TAKE 1 TABLET BY MOUTH EVERY DAY 07/24/20   Theophilus Andrews, Tully GRADE, MD  polyethylene glycol (MIRALAX ) 17 g packet Take 17 g by mouth daily as needed for moderate constipation. 07/19/22   Cindy Garnette POUR, MD  valsartan  (DIOVAN ) 320 MG tablet TAKE 1 TABLET BY MOUTH EVERY DAY 07/21/23   Walker, Caitlin S, NP  zolpidem  (AMBIEN ) 10 MG tablet TAKE 1 TABLET BY MOUTH AT BEDTIME AS NEEDED FOR SLEEP 08/27/23   Nafziger, Darleene, NP    Allergies: Morphine  and codeine    Review of Systems  Respiratory:  Positive for cough.     Updated Vital Signs BP (!) 191/151 (BP Location: Right Arm)   Pulse 70   Temp 98.6 F (37 C) (Oral)   Resp 17   SpO2 97%   Physical Exam Vitals and nursing note reviewed.  HENT:     Head: Atraumatic.  Cardiovascular:     Rate and Rhythm: Normal rate.  Pulmonary:     Breath sounds: No wheezing.  Chest:     Chest wall: No tenderness.  Abdominal:     Tenderness: There is no abdominal tenderness.  Skin:    General: Skin is warm.  Neurological:     Mental  Status: She is alert.     (all labs ordered are listed, but only abnormal results are displayed) Labs Reviewed  BASIC METABOLIC PANEL WITH GFR - Abnormal; Notable for the following components:      Result Value   Creatinine, Ser 1.24 (*)    GFR, Estimated 46 (*)    All other components within normal limits  CBC - Abnormal; Notable for the following components:   RBC 3.76 (*)    All other components within normal limits  RESP PANEL BY RT-PCR (RSV, FLU A&B, COVID)  RVPGX2  TROPONIN T, HIGH SENSITIVITY    EKG: EKG Interpretation Date/Time:  Tuesday September 22 2023 10:07:14 EDT Ventricular Rate:  67 PR Interval:  186 QRS Duration:  83 QT Interval:  420 QTC Calculation: 444 R Axis:   66  Text Interpretation: Sinus rhythm Consider left ventricular hypertrophy Confirmed by Patsey Lot [phone]) on 09/22/2023 11:45:25  AM  Radiology: [facility] Chest 2 View Result Date: 09/22/2023 CLINICAL DATA:  Cough, congestion and body aches EXAM: CHEST - 2 VIEW COMPARISON:  Prior chest x-ray 03/18/2022 FINDINGS: Cardiac and mediastinal contours are within normal limits. No focal airspace infiltrate, pleural effusion or pneumothorax. Mild hyperinflation and chronic bronchitic changes. No acute osseous abnormality. Prior surgical changes of C5-C6 anterior cervical discectomy and fusion. IMPRESSION: Stable chest x-ray without evidence of acute cardiopulmonary process. Electronically Signed   By: Wilkie Lent M.D.   On: 09/22/2023 10:54     Procedures   Medications Ordered in the ED - No data to display                                  Medical Decision Making Amount and/or Complexity of Data Reviewed Labs: ordered. Radiology: ordered.   Patient with URI symptoms and cough.  Nasal congestion.  No chest pain.  X-ray reassuring.  No CHF seen.  Negative viral testing.  Able to ambulate without hypoxia.  Most likely viral syndrome.   Does have hypertension.  Had not taken her medicines today.  Discussed with patient she will take her medicines.  States she did take them yesterday.  Appears stable for discharge home to follow-up with PCP.     Final diagnoses:  Upper respiratory tract infection, unspecified type    ED Discharge Orders     None          Patsey Lot, MD 09/22/23 1226

## 2023-09-22 NOTE — ED Triage Notes (Signed)
 States cough, congestion, and body aches since yesterday morning. Reports during coughing fit she becomes shob and feels like her heart racing.   Has not taken BP meds today.

## 2023-09-22 NOTE — Progress Notes (Signed)
 Before ambulation patient SPO2 was 97%, HR 64 RR 16. As ambulation began her SPO2 97% remained the same throughout walking around the ER. She was not SHOB, no increase in RR or HR. Once patient returned to her room, SPO2 97%, HR 63 RR 18. Tolerated without any issue.

## 2023-09-22 NOTE — ED Notes (Signed)
 Patient states did not take BP medications this am and plans to take them as soon as she gets home

## 2023-09-23 ENCOUNTER — Other Ambulatory Visit: Payer: Self-pay | Admitting: Adult Health

## 2023-09-23 DIAGNOSIS — G4700 Insomnia, unspecified: Secondary | ICD-10-CM

## 2023-10-02 DIAGNOSIS — J22 Unspecified acute lower respiratory infection: Secondary | ICD-10-CM | POA: Diagnosis not present

## 2023-10-02 DIAGNOSIS — N1831 Chronic kidney disease, stage 3a: Secondary | ICD-10-CM | POA: Diagnosis not present

## 2023-10-02 DIAGNOSIS — Z720 Tobacco use: Secondary | ICD-10-CM | POA: Diagnosis not present

## 2023-10-02 DIAGNOSIS — R062 Wheezing: Secondary | ICD-10-CM | POA: Diagnosis not present

## 2023-10-13 ENCOUNTER — Ambulatory Visit (INDEPENDENT_AMBULATORY_CARE_PROVIDER_SITE_OTHER): Admitting: Internal Medicine

## 2023-10-13 ENCOUNTER — Encounter: Payer: Self-pay | Admitting: Internal Medicine

## 2023-10-13 VITALS — BP 120/70 | HR 70 | Temp 98.9°F | Wt 128.3 lb

## 2023-10-13 DIAGNOSIS — I5042 Chronic combined systolic (congestive) and diastolic (congestive) heart failure: Secondary | ICD-10-CM | POA: Diagnosis not present

## 2023-10-13 DIAGNOSIS — Z23 Encounter for immunization: Secondary | ICD-10-CM | POA: Diagnosis not present

## 2023-10-13 DIAGNOSIS — Z09 Encounter for follow-up examination after completed treatment for conditions other than malignant neoplasm: Secondary | ICD-10-CM
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
## 2023-10-13 NOTE — Addendum Note (Signed)
 Addended by: KATHRYNE MILLMAN B on: 10/13/2023 01:15 PM   Modules accepted: Orders

## 2023-10-21 ENCOUNTER — Other Ambulatory Visit: Payer: Self-pay | Admitting: Internal Medicine

## 2023-10-21 DIAGNOSIS — G4700 Insomnia, unspecified: Secondary | ICD-10-CM

## 2023-10-28 DIAGNOSIS — Z23 Encounter for immunization: Secondary | ICD-10-CM | POA: Diagnosis not present

## 2023-11-17 ENCOUNTER — Ambulatory Visit: Admitting: Family Medicine

## 2023-11-29 ENCOUNTER — Other Ambulatory Visit: Payer: Self-pay | Admitting: Internal Medicine

## 2023-11-29 DIAGNOSIS — G4700 Insomnia, unspecified: Secondary | ICD-10-CM

## 2023-12-30 ENCOUNTER — Other Ambulatory Visit: Payer: Self-pay | Admitting: Internal Medicine

## 2023-12-30 DIAGNOSIS — G4700 Insomnia, unspecified: Secondary | ICD-10-CM

## 2024-01-05 ENCOUNTER — Encounter: Payer: Self-pay | Admitting: Internal Medicine

## 2024-01-05 ENCOUNTER — Ambulatory Visit: Admitting: Internal Medicine

## 2024-01-05 VITALS — BP 128/78 | HR 74 | Temp 98.2°F | Ht 59.0 in | Wt 134.8 lb

## 2024-01-05 DIAGNOSIS — Z1231 Encounter for screening mammogram for malignant neoplasm of breast: Secondary | ICD-10-CM

## 2024-01-05 DIAGNOSIS — Z780 Asymptomatic menopausal state: Secondary | ICD-10-CM

## 2024-01-05 DIAGNOSIS — F339 Major depressive disorder, recurrent, unspecified: Secondary | ICD-10-CM

## 2024-01-05 DIAGNOSIS — I10 Essential (primary) hypertension: Secondary | ICD-10-CM

## 2024-01-05 DIAGNOSIS — E559 Vitamin D deficiency, unspecified: Secondary | ICD-10-CM

## 2024-01-05 DIAGNOSIS — Z0000 Encounter for general adult medical examination without abnormal findings: Secondary | ICD-10-CM

## 2024-01-05 DIAGNOSIS — I5042 Chronic combined systolic (congestive) and diastolic (congestive) heart failure: Secondary | ICD-10-CM

## 2024-01-05 DIAGNOSIS — N1831 Chronic kidney disease, stage 3a: Secondary | ICD-10-CM

## 2024-01-05 LAB — COMPREHENSIVE METABOLIC PANEL WITH GFR
ALT: 13 U/L (ref 3–35)
AST: 21 U/L (ref 5–37)
Albumin: 4.6 g/dL (ref 3.5–5.2)
Alkaline Phosphatase: 57 U/L (ref 39–117)
BUN: 18 mg/dL (ref 6–23)
CO2: 25 meq/L (ref 19–32)
Calcium: 10 mg/dL (ref 8.4–10.5)
Chloride: 108 meq/L (ref 96–112)
Creatinine, Ser: 1.18 mg/dL (ref 0.40–1.20)
GFR: 46.26 mL/min — ABNORMAL LOW (ref >60.00)
Glucose, Bld: 101 mg/dL — ABNORMAL HIGH (ref 70–99)
Potassium: 3.7 meq/L (ref 3.5–5.1)
Sodium: 141 meq/L (ref 135–145)
Total Bilirubin: 0.5 mg/dL (ref 0.2–1.2)
Total Protein: 7.5 g/dL (ref 6.0–8.3)

## 2024-01-05 LAB — CBC WITH DIFFERENTIAL/PLATELET
Basophils Absolute: 0 K/uL (ref 0.0–0.1)
Basophils Relative: 0.7 % (ref 0.0–3.0)
Eosinophils Absolute: 0.1 K/uL (ref 0.0–0.7)
Eosinophils Relative: 1.7 % (ref 0.0–5.0)
HCT: 36 % (ref 36.0–46.0)
Hemoglobin: 12.2 g/dL (ref 12.0–15.0)
Lymphocytes Relative: 28 % (ref 12.0–46.0)
Lymphs Abs: 1.5 K/uL (ref 0.7–4.0)
MCHC: 33.8 g/dL (ref 30.0–36.0)
MCV: 99.2 fl (ref 78.0–100.0)
Monocytes Absolute: 0.6 K/uL (ref 0.1–1.0)
Monocytes Relative: 11.2 % (ref 3.0–12.0)
Neutro Abs: 3.2 K/uL (ref 1.4–7.7)
Neutrophils Relative %: 58.4 % (ref 43.0–77.0)
Platelets: 213 K/uL (ref 150.0–400.0)
RBC: 3.63 Mil/uL — ABNORMAL LOW (ref 3.87–5.11)
RDW: 14.4 % (ref 11.5–15.5)
WBC: 5.5 K/uL (ref 4.0–10.5)

## 2024-01-05 LAB — LIPID PANEL
Cholesterol: 119 mg/dL (ref 28–200)
HDL: 66.1 mg/dL (ref >39.00)
LDL Cholesterol: 41 mg/dL (ref 10–99)
NonHDL: 52.95
Total CHOL/HDL Ratio: 2
Triglycerides: 58 mg/dL (ref 10.0–149.0)
VLDL: 11.6 mg/dL (ref 0.0–40.0)

## 2024-01-05 LAB — VITAMIN B12: Vitamin B-12: >1500 pg/mL — ABNORMAL HIGH (ref 211–911)

## 2024-01-05 LAB — TSH: TSH: 2.44 u[IU]/mL (ref 0.35–5.50)

## 2024-01-05 LAB — VITAMIN D 25 HYDROXY (VIT D DEFICIENCY, FRACTURES): VITD: 24.46 ng/mL — ABNORMAL LOW (ref 30.00–100.00)

## 2024-01-05 MED ORDER — SERTRALINE HCL 25 MG PO TABS: 25.0000 mg | ORAL_TABLET | Freq: Every day | ORAL | 1 refills | Status: AC
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
## 2024-01-06 ENCOUNTER — Ambulatory Visit: Payer: Self-pay | Admitting: Internal Medicine

## 2024-01-06 DIAGNOSIS — E559 Vitamin D deficiency, unspecified: Secondary | ICD-10-CM | POA: Insufficient documentation

## 2024-01-06 MED ORDER — VITAMIN D (ERGOCALCIFEROL) 1.25 MG (50000 UNIT) PO CAPS: 50000.0000 [IU] | ORAL_CAPSULE | ORAL | 0 refills | Status: AC

## 2024-01-06 NOTE — Telephone Encounter (Signed)
 Tried to call the patient unable to reach her. Patient voicemail was full.

## 2024-01-07 ENCOUNTER — Telehealth: Payer: Self-pay | Admitting: *Deleted

## 2024-01-07 NOTE — Telephone Encounter (Signed)
 Copied from CRM [id number]. Topic: Clinical - Lab/Test Results >> Jan 07, 2024  8:10 AM Montie POUR wrote: Reason for CRM:  I read her lab results dated 01/06/24. She does not need a call back and no concerns.

## 2024-01-26 ENCOUNTER — Ambulatory Visit
Admission: RE | Admit: 2024-01-26 | Discharge: 2024-01-26 | Disposition: A | Discharge status: Home / Self Care | Source: Ambulatory Visit | Attending: Internal Medicine | Admitting: Internal Medicine

## 2024-01-26 DIAGNOSIS — Z780 Asymptomatic menopausal state: Secondary | ICD-10-CM | POA: Diagnosis not present

## 2024-02-01 ENCOUNTER — Telehealth: Payer: Self-pay | Admitting: *Deleted

## 2024-02-01 NOTE — Telephone Encounter (Signed)
 Patient is aware.

## 2024-02-01 NOTE — Telephone Encounter (Signed)
 Spoke to the patient and reviewed Dexa results.  Patient states that the medication given to her for anxiety is causing a headache.  Please advise.

## 2024-02-02 ENCOUNTER — Other Ambulatory Visit: Payer: Self-pay | Admitting: Internal Medicine

## 2024-02-02 DIAGNOSIS — G4700 Insomnia, unspecified: Secondary | ICD-10-CM

## 2024-02-05 ENCOUNTER — Ambulatory Visit
Admission: RE | Admit: 2024-02-05 | Discharge: 2024-02-05 | Disposition: A | Discharge status: Home / Self Care | Source: Ambulatory Visit | Attending: Internal Medicine | Admitting: Internal Medicine

## 2024-02-05 DIAGNOSIS — Z1231 Encounter for screening mammogram for malignant neoplasm of breast: Secondary | ICD-10-CM

## 2024-03-02 ENCOUNTER — Ambulatory Visit

## 2024-04-04 ENCOUNTER — Ambulatory Visit: Admitting: Internal Medicine
